# Patient Record
Sex: Female | Born: 1965
Health system: Southern US, Community
[De-identification: ages and names within clinical notes are randomized; demographics above are authoritative.]

## PROBLEM LIST (undated history)

## (undated) DIAGNOSIS — M1990 Unspecified osteoarthritis, unspecified site: Secondary | ICD-10-CM

## (undated) DIAGNOSIS — E119 Type 2 diabetes mellitus without complications: Secondary | ICD-10-CM

## (undated) DIAGNOSIS — A599 Trichomoniasis, unspecified: Secondary | ICD-10-CM

## (undated) DIAGNOSIS — G5700 Lesion of sciatic nerve, unspecified lower limb: Secondary | ICD-10-CM

## (undated) DIAGNOSIS — M2550 Pain in unspecified joint: Secondary | ICD-10-CM

## (undated) DIAGNOSIS — M549 Dorsalgia, unspecified: Secondary | ICD-10-CM

## (undated) DIAGNOSIS — M329 Systemic lupus erythematosus, unspecified: Secondary | ICD-10-CM

## (undated) DIAGNOSIS — G4700 Insomnia, unspecified: Secondary | ICD-10-CM

## (undated) DIAGNOSIS — IMO0002 Reserved for concepts with insufficient information to code with codable children: Secondary | ICD-10-CM

## (undated) DIAGNOSIS — I10 Essential (primary) hypertension: Secondary | ICD-10-CM

## (undated) DIAGNOSIS — G8929 Other chronic pain: Secondary | ICD-10-CM

## (undated) DIAGNOSIS — F1910 Other psychoactive substance abuse, uncomplicated: Secondary | ICD-10-CM

## (undated) DIAGNOSIS — R000 Tachycardia, unspecified: Secondary | ICD-10-CM

## (undated) HISTORY — DX: Essential (primary) hypertension: I10

## (undated) HISTORY — PX: APPENDECTOMY: SHX54

## (undated) HISTORY — PX: TONSILLECTOMY: SUR1361

---
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
## 1999-09-29 ENCOUNTER — Other Ambulatory Visit: Admission: RE | Admit: 1999-09-29 | Discharge: 1999-09-29 | Payer: Self-pay | Admitting: Family Medicine

## 2005-12-09 ENCOUNTER — Ambulatory Visit: Payer: Self-pay | Admitting: Internal Medicine

## 2005-12-16 ENCOUNTER — Ambulatory Visit (HOSPITAL_COMMUNITY): Admission: RE | Admit: 2005-12-16 | Discharge: 2005-12-16 | Payer: Self-pay | Admitting: Hospitalist

## 2006-04-20 ENCOUNTER — Ambulatory Visit: Payer: Self-pay | Admitting: Obstetrics and Gynecology

## 2006-05-04 ENCOUNTER — Ambulatory Visit: Payer: Self-pay | Admitting: Gynecology

## 2007-09-27 ENCOUNTER — Other Ambulatory Visit: Admission: RE | Admit: 2007-09-27 | Discharge: 2007-09-27 | Payer: Self-pay | Admitting: Obstetrics and Gynecology

## 2007-09-27 ENCOUNTER — Encounter: Payer: Self-pay | Admitting: Family Medicine

## 2007-09-27 ENCOUNTER — Ambulatory Visit: Payer: Self-pay | Admitting: Family Medicine

## 2007-10-09 ENCOUNTER — Ambulatory Visit (HOSPITAL_COMMUNITY): Admission: RE | Admit: 2007-10-09 | Discharge: 2007-10-09 | Payer: Self-pay | Admitting: Family Medicine

## 2007-10-25 ENCOUNTER — Ambulatory Visit: Payer: Self-pay | Admitting: Family Medicine

## 2008-04-03 ENCOUNTER — Ambulatory Visit: Payer: Self-pay | Admitting: Obstetrics & Gynecology

## 2008-05-22 ENCOUNTER — Emergency Department (HOSPITAL_COMMUNITY): Admission: EM | Admit: 2008-05-22 | Discharge: 2008-05-22 | Payer: Self-pay | Admitting: Emergency Medicine

## 2008-06-09 ENCOUNTER — Telehealth (INDEPENDENT_AMBULATORY_CARE_PROVIDER_SITE_OTHER): Payer: Self-pay | Admitting: *Deleted

## 2008-06-12 ENCOUNTER — Ambulatory Visit: Payer: Self-pay | Admitting: Family Medicine

## 2008-06-15 ENCOUNTER — Telehealth (INDEPENDENT_AMBULATORY_CARE_PROVIDER_SITE_OTHER): Payer: Self-pay | Admitting: Family Medicine

## 2008-06-20 ENCOUNTER — Inpatient Hospital Stay (HOSPITAL_COMMUNITY): Admission: EM | Admit: 2008-06-20 | Discharge: 2008-06-30 | Payer: Self-pay | Admitting: Emergency Medicine

## 2008-06-20 ENCOUNTER — Telehealth (INDEPENDENT_AMBULATORY_CARE_PROVIDER_SITE_OTHER): Payer: Self-pay | Admitting: Family Medicine

## 2008-06-20 ENCOUNTER — Ambulatory Visit: Payer: Self-pay | Admitting: Infectious Diseases

## 2008-06-27 ENCOUNTER — Encounter (INDEPENDENT_AMBULATORY_CARE_PROVIDER_SITE_OTHER): Payer: Self-pay | Admitting: Internal Medicine

## 2008-07-04 ENCOUNTER — Encounter: Payer: Self-pay | Admitting: *Deleted

## 2008-07-04 ENCOUNTER — Ambulatory Visit: Payer: Self-pay | Admitting: Family Medicine

## 2008-07-04 DIAGNOSIS — R739 Hyperglycemia, unspecified: Secondary | ICD-10-CM

## 2008-07-08 ENCOUNTER — Encounter: Payer: Self-pay | Admitting: *Deleted

## 2008-07-10 ENCOUNTER — Encounter (INDEPENDENT_AMBULATORY_CARE_PROVIDER_SITE_OTHER): Payer: Self-pay | Admitting: Family Medicine

## 2008-07-10 ENCOUNTER — Telehealth (INDEPENDENT_AMBULATORY_CARE_PROVIDER_SITE_OTHER): Payer: Self-pay | Admitting: Family Medicine

## 2008-07-10 ENCOUNTER — Ambulatory Visit: Payer: Self-pay | Admitting: Family Medicine

## 2008-07-11 ENCOUNTER — Encounter (INDEPENDENT_AMBULATORY_CARE_PROVIDER_SITE_OTHER): Payer: Self-pay | Admitting: Emergency Medicine

## 2008-07-11 ENCOUNTER — Encounter (INDEPENDENT_AMBULATORY_CARE_PROVIDER_SITE_OTHER): Payer: Self-pay | Admitting: Family Medicine

## 2008-07-11 ENCOUNTER — Ambulatory Visit: Payer: Self-pay | Admitting: Vascular Surgery

## 2008-07-11 ENCOUNTER — Ambulatory Visit (HOSPITAL_COMMUNITY): Admission: RE | Admit: 2008-07-11 | Discharge: 2008-07-11 | Payer: Self-pay | Admitting: Family Medicine

## 2008-07-23 ENCOUNTER — Ambulatory Visit: Payer: Self-pay | Admitting: Family Medicine

## 2008-07-29 ENCOUNTER — Encounter (INDEPENDENT_AMBULATORY_CARE_PROVIDER_SITE_OTHER): Payer: Self-pay | Admitting: Family Medicine

## 2008-07-30 ENCOUNTER — Encounter (INDEPENDENT_AMBULATORY_CARE_PROVIDER_SITE_OTHER): Payer: Self-pay | Admitting: Family Medicine

## 2008-08-04 ENCOUNTER — Ambulatory Visit: Payer: Self-pay | Admitting: Family Medicine

## 2008-08-04 DIAGNOSIS — F17200 Nicotine dependence, unspecified, uncomplicated: Secondary | ICD-10-CM | POA: Insufficient documentation

## 2008-08-06 ENCOUNTER — Encounter (HOSPITAL_BASED_OUTPATIENT_CLINIC_OR_DEPARTMENT_OTHER): Admission: RE | Admit: 2008-08-06 | Discharge: 2008-11-04 | Payer: Self-pay | Admitting: Internal Medicine

## 2008-08-26 ENCOUNTER — Telehealth: Payer: Self-pay | Admitting: *Deleted

## 2008-08-26 ENCOUNTER — Encounter (INDEPENDENT_AMBULATORY_CARE_PROVIDER_SITE_OTHER): Payer: Self-pay | Admitting: Family Medicine

## 2008-08-27 ENCOUNTER — Encounter (INDEPENDENT_AMBULATORY_CARE_PROVIDER_SITE_OTHER): Payer: Self-pay | Admitting: Family Medicine

## 2008-09-08 ENCOUNTER — Encounter (INDEPENDENT_AMBULATORY_CARE_PROVIDER_SITE_OTHER): Payer: Self-pay | Admitting: Family Medicine

## 2008-10-15 ENCOUNTER — Ambulatory Visit: Payer: Self-pay | Admitting: Family Medicine

## 2008-10-15 ENCOUNTER — Encounter: Payer: Self-pay | Admitting: Family Medicine

## 2008-10-15 DIAGNOSIS — E669 Obesity, unspecified: Secondary | ICD-10-CM

## 2008-10-20 ENCOUNTER — Encounter: Payer: Self-pay | Admitting: Family Medicine

## 2008-10-20 ENCOUNTER — Ambulatory Visit: Payer: Self-pay | Admitting: Family Medicine

## 2008-11-04 LAB — CONVERTED CEMR LAB
Albumin: 3.9 g/dL (ref 3.5–5.2)
BUN: 8 mg/dL (ref 6–23)
Calcium: 8.8 mg/dL (ref 8.4–10.5)
Chloride: 110 meq/L (ref 96–112)
Glucose, Bld: 112 mg/dL — ABNORMAL HIGH (ref 70–99)
HDL: 64 mg/dL (ref >39)
Potassium: 3.8 meq/L (ref 3.5–5.3)
Triglycerides: 100 mg/dL (ref <150)

## 2008-12-03 ENCOUNTER — Encounter: Payer: Self-pay | Admitting: Family Medicine

## 2008-12-26 ENCOUNTER — Ambulatory Visit: Payer: Self-pay | Admitting: Family Medicine

## 2008-12-26 ENCOUNTER — Telehealth: Payer: Self-pay | Admitting: *Deleted

## 2008-12-30 ENCOUNTER — Encounter: Payer: Self-pay | Admitting: Family Medicine

## 2009-01-21 ENCOUNTER — Ambulatory Visit: Payer: Self-pay | Admitting: Family Medicine

## 2009-01-21 ENCOUNTER — Encounter: Payer: Self-pay | Admitting: Family Medicine

## 2009-03-12 ENCOUNTER — Ambulatory Visit: Payer: Self-pay | Admitting: Family Medicine

## 2009-04-07 ENCOUNTER — Encounter: Payer: Self-pay | Admitting: Family Medicine

## 2009-05-12 ENCOUNTER — Telehealth: Payer: Self-pay | Admitting: *Deleted

## 2009-05-15 ENCOUNTER — Encounter: Payer: Self-pay | Admitting: Family Medicine

## 2009-05-15 ENCOUNTER — Ambulatory Visit: Payer: Self-pay | Admitting: Family Medicine

## 2009-05-15 LAB — CONVERTED CEMR LAB
AST: 13 units/L (ref 0–37)
Alkaline Phosphatase: 54 units/L (ref 39–117)
BUN: 9 mg/dL (ref 6–23)
Basophils Absolute: 0 10*3/uL (ref 0.0–0.1)
Calcium: 9.2 mg/dL (ref 8.4–10.5)
Chloride: 104 meq/L (ref 96–112)
Creatinine, Ser: 0.68 mg/dL (ref 0.40–1.20)
Eosinophils Absolute: 0 10*3/uL (ref 0.0–0.7)
Eosinophils Relative: 0 % (ref 0–5)
HCT: 40 % (ref 36.0–46.0)
MCV: 78 fL (ref 78.0–100.0)
Platelets: 423 10*3/uL — ABNORMAL HIGH (ref 150–400)
RDW: 14.2 % (ref 11.5–15.5)

## 2009-06-15 ENCOUNTER — Telehealth: Payer: Self-pay | Admitting: Family Medicine

## 2009-06-22 ENCOUNTER — Encounter: Payer: Self-pay | Admitting: Family Medicine

## 2009-06-30 ENCOUNTER — Ambulatory Visit: Payer: Self-pay | Admitting: Family Medicine

## 2009-07-20 ENCOUNTER — Encounter: Payer: Self-pay | Admitting: Family Medicine

## 2009-07-23 ENCOUNTER — Telehealth: Payer: Self-pay | Admitting: Family Medicine

## 2009-07-27 ENCOUNTER — Encounter: Payer: Self-pay | Admitting: Family Medicine

## 2009-08-24 ENCOUNTER — Telehealth: Payer: Self-pay | Admitting: Family Medicine

## 2009-08-31 ENCOUNTER — Encounter: Payer: Self-pay | Admitting: Family Medicine

## 2009-09-24 ENCOUNTER — Encounter: Payer: Self-pay | Admitting: Family Medicine

## 2009-09-30 ENCOUNTER — Telehealth: Payer: Self-pay | Admitting: Family Medicine

## 2009-10-01 DIAGNOSIS — M329 Systemic lupus erythematosus, unspecified: Secondary | ICD-10-CM

## 2009-10-06 ENCOUNTER — Telehealth (INDEPENDENT_AMBULATORY_CARE_PROVIDER_SITE_OTHER): Payer: Self-pay | Admitting: *Deleted

## 2009-10-12 ENCOUNTER — Encounter: Payer: Self-pay | Admitting: Family Medicine

## 2009-10-16 ENCOUNTER — Ambulatory Visit: Payer: Self-pay | Admitting: Family Medicine

## 2009-11-05 ENCOUNTER — Ambulatory Visit: Payer: Self-pay | Admitting: Family Medicine

## 2009-11-23 ENCOUNTER — Telehealth: Payer: Self-pay | Admitting: *Deleted

## 2009-11-26 ENCOUNTER — Ambulatory Visit: Payer: Self-pay | Admitting: Family Medicine

## 2009-11-26 ENCOUNTER — Encounter: Payer: Self-pay | Admitting: Family Medicine

## 2009-11-26 ENCOUNTER — Inpatient Hospital Stay (HOSPITAL_COMMUNITY): Admission: EM | Admit: 2009-11-26 | Discharge: 2009-11-30 | Payer: Self-pay | Admitting: Emergency Medicine

## 2009-11-26 LAB — CONVERTED CEMR LAB
BUN: 4 mg/dL — ABNORMAL LOW (ref 6–23)
Basophils Relative: 0 % (ref 0–1)
CO2: 25 meq/L (ref 19–32)
Chloride: 105 meq/L (ref 96–112)
Creatinine, Ser: 0.6 mg/dL (ref 0.40–1.20)
Glucose, Bld: 142 mg/dL — ABNORMAL HIGH (ref 70–99)
Hemoglobin: 8.1 g/dL — ABNORMAL LOW (ref 12.0–15.0)
Lymphocytes Relative: 15 % (ref 12–46)
Lymphs Abs: 1.8 10*3/uL (ref 0.7–4.0)
MCHC: 27.4 g/dL — ABNORMAL LOW (ref 30.0–36.0)
Monocytes Absolute: 1 10*3/uL (ref 0.1–1.0)
Monocytes Relative: 9 % (ref 3–12)
Neutro Abs: 8.6 10*3/uL — ABNORMAL HIGH (ref 1.7–7.7)
RBC: 4.46 M/uL (ref 3.87–5.11)
WBC: 11.5 10*3/uL — ABNORMAL HIGH (ref 4.0–10.5)

## 2009-11-27 ENCOUNTER — Encounter (INDEPENDENT_AMBULATORY_CARE_PROVIDER_SITE_OTHER): Payer: Self-pay | Admitting: Cardiology

## 2009-12-03 ENCOUNTER — Ambulatory Visit: Payer: Self-pay | Admitting: Family Medicine

## 2009-12-10 ENCOUNTER — Ambulatory Visit: Payer: Self-pay | Admitting: Family Medicine

## 2009-12-14 ENCOUNTER — Encounter: Payer: Self-pay | Admitting: Family Medicine

## 2009-12-17 ENCOUNTER — Telehealth: Payer: Self-pay | Admitting: Family Medicine

## 2009-12-18 ENCOUNTER — Encounter: Payer: Self-pay | Admitting: Family Medicine

## 2009-12-18 ENCOUNTER — Ambulatory Visit: Payer: Self-pay | Admitting: Family Medicine

## 2009-12-18 DIAGNOSIS — G609 Hereditary and idiopathic neuropathy, unspecified: Secondary | ICD-10-CM | POA: Insufficient documentation

## 2009-12-18 LAB — CONVERTED CEMR LAB
BUN: 9 mg/dL (ref 6–23)
CO2: 23 meq/L (ref 19–32)
Calcium: 9 mg/dL (ref 8.4–10.5)
Creatinine, Ser: 0.74 mg/dL (ref 0.40–1.20)
Glucose, Bld: 128 mg/dL — ABNORMAL HIGH (ref 70–99)
Magnesium: 1.7 mg/dL (ref 1.5–2.5)

## 2009-12-29 ENCOUNTER — Telehealth: Payer: Self-pay | Admitting: Family Medicine

## 2010-01-11 ENCOUNTER — Telehealth: Payer: Self-pay | Admitting: Family Medicine

## 2010-01-19 ENCOUNTER — Encounter: Payer: Self-pay | Admitting: Family Medicine

## 2010-01-25 ENCOUNTER — Ambulatory Visit: Payer: Self-pay | Admitting: Family Medicine

## 2010-01-25 DIAGNOSIS — M129 Arthropathy, unspecified: Secondary | ICD-10-CM | POA: Insufficient documentation

## 2010-02-03 ENCOUNTER — Telehealth: Payer: Self-pay | Admitting: Family Medicine

## 2010-02-19 ENCOUNTER — Encounter: Payer: Self-pay | Admitting: Family Medicine

## 2010-02-19 ENCOUNTER — Ambulatory Visit: Payer: Self-pay | Admitting: Family Medicine

## 2010-03-09 ENCOUNTER — Encounter: Payer: Self-pay | Admitting: Family Medicine

## 2010-03-26 ENCOUNTER — Encounter (INDEPENDENT_AMBULATORY_CARE_PROVIDER_SITE_OTHER): Payer: Self-pay | Admitting: *Deleted

## 2010-04-24 ENCOUNTER — Encounter: Payer: Self-pay | Admitting: Family Medicine

## 2010-04-27 ENCOUNTER — Ambulatory Visit: Admission: RE | Admit: 2010-04-27 | Discharge: 2010-04-27 | Payer: Self-pay | Source: Home / Self Care

## 2010-05-09 ENCOUNTER — Emergency Department (HOSPITAL_COMMUNITY)
Admission: EM | Admit: 2010-05-09 | Discharge: 2010-05-09 | Payer: Self-pay | Source: Home / Self Care | Admitting: Emergency Medicine

## 2010-05-10 LAB — BASIC METABOLIC PANEL
BUN: 12 mg/dL (ref 6–23)
CO2: 26 mEq/L (ref 19–32)
Calcium: 8.6 mg/dL (ref 8.4–10.5)
Chloride: 105 mEq/L (ref 96–112)
Creatinine, Ser: 0.81 mg/dL (ref 0.4–1.2)
GFR calc Af Amer: >60 mL/min (ref >60)
GFR calc non Af Amer: >60 mL/min (ref >60)
Glucose, Bld: 123 mg/dL — ABNORMAL HIGH (ref 70–99)
Potassium: 3.7 mEq/L (ref 3.5–5.1)
Sodium: 137 mEq/L (ref 135–145)

## 2010-05-10 LAB — URINALYSIS, ROUTINE W REFLEX MICROSCOPIC
Bilirubin Urine: NEGATIVE
Hgb urine dipstick: NEGATIVE
Ketones, ur: 15 mg/dL — AB
Leukocytes, UA: NEGATIVE
Nitrite: NEGATIVE
Protein, ur: 30 mg/dL — AB
Specific Gravity, Urine: 1.034 — ABNORMAL HIGH (ref 1.005–1.030)
Urine Glucose, Fasting: NEGATIVE mg/dL
Urobilinogen, UA: 0.2 mg/dL (ref 0.0–1.0)
pH: 5.5 (ref 5.0–8.0)

## 2010-05-10 LAB — CBC
HCT: 33.6 % — ABNORMAL LOW (ref 36.0–46.0)
Hemoglobin: 9.4 g/dL — ABNORMAL LOW (ref 12.0–15.0)
MCH: 18.7 pg — ABNORMAL LOW (ref 26.0–34.0)
MCHC: 28 g/dL — ABNORMAL LOW (ref 30.0–36.0)
MCV: 66.8 fL — ABNORMAL LOW (ref 78.0–100.0)
Platelets: 378 10*3/uL (ref 150–400)
RBC: 5.03 MIL/uL (ref 3.87–5.11)
RDW: 17.6 % — ABNORMAL HIGH (ref 11.5–15.5)
WBC: 10.8 10*3/uL — ABNORMAL HIGH (ref 4.0–10.5)

## 2010-05-10 LAB — DIFFERENTIAL
Basophils Absolute: 0 10*3/uL (ref 0.0–0.1)
Basophils Relative: 0 % (ref 0–1)
Eosinophils Absolute: 0.2 10*3/uL (ref 0.0–0.7)
Eosinophils Relative: 2 % (ref 0–5)
Lymphocytes Relative: 17 % (ref 12–46)
Lymphs Abs: 1.9 10*3/uL (ref 0.7–4.0)
Monocytes Absolute: 0.7 10*3/uL (ref 0.1–1.0)
Monocytes Relative: 6 % (ref 3–12)
Neutro Abs: 8.1 10*3/uL — ABNORMAL HIGH (ref 1.7–7.7)
Neutrophils Relative %: 75 % (ref 43–77)

## 2010-05-10 LAB — URINE MICROSCOPIC-ADD ON

## 2010-05-14 ENCOUNTER — Ambulatory Visit: Admission: RE | Admit: 2010-05-14 | Discharge: 2010-05-14 | Payer: Self-pay | Source: Home / Self Care

## 2010-05-17 ENCOUNTER — Ambulatory Visit
Admission: RE | Admit: 2010-05-17 | Discharge: 2010-05-17 | Payer: Self-pay | Source: Home / Self Care | Attending: Family Medicine | Admitting: Family Medicine

## 2010-05-17 ENCOUNTER — Ambulatory Visit: Admission: RE | Admit: 2010-05-17 | Discharge: 2010-05-17 | Payer: Self-pay | Source: Home / Self Care

## 2010-05-17 DIAGNOSIS — M79609 Pain in unspecified limb: Secondary | ICD-10-CM | POA: Insufficient documentation

## 2010-05-19 ENCOUNTER — Telehealth: Payer: Self-pay | Admitting: Family Medicine

## 2010-05-22 ENCOUNTER — Emergency Department (HOSPITAL_COMMUNITY)
Admission: EM | Admit: 2010-05-22 | Discharge: 2010-05-22 | Payer: Self-pay | Source: Home / Self Care | Admitting: Emergency Medicine

## 2010-05-22 ENCOUNTER — Encounter: Payer: Self-pay | Admitting: Sports Medicine

## 2010-05-22 LAB — POCT I-STAT, CHEM 8
BUN: 5 mg/dL — ABNORMAL LOW (ref 6–23)
Creatinine, Ser: 0.8 mg/dL (ref 0.4–1.2)
Glucose, Bld: 117 mg/dL — ABNORMAL HIGH (ref 70–99)
Hemoglobin: 11.9 g/dL — ABNORMAL LOW (ref 12.0–15.0)
TCO2: 25 mmol/L (ref 0–100)

## 2010-05-22 LAB — DIFFERENTIAL
Basophils Absolute: 0 10*3/uL (ref 0.0–0.1)
Basophils Relative: 0 % (ref 0–1)
Eosinophils Relative: 2 % (ref 0–5)
Monocytes Absolute: 0.7 10*3/uL (ref 0.1–1.0)
Monocytes Relative: 7 % (ref 3–12)

## 2010-05-22 LAB — CBC
HCT: 33.3 % — ABNORMAL LOW (ref 36.0–46.0)
MCH: 18.9 pg — ABNORMAL LOW (ref 26.0–34.0)
MCHC: 28.8 g/dL — ABNORMAL LOW (ref 30.0–36.0)
RDW: 17.3 % — ABNORMAL HIGH (ref 11.5–15.5)

## 2010-05-24 ENCOUNTER — Encounter: Payer: Self-pay | Admitting: Family Medicine

## 2010-05-27 NOTE — Assessment & Plan Note (Signed)
Summary: Admit H&P   Vital Signs:  Patient profile:   45 year old female O2 Sat:      100 % on 2 L/min Pulse rate:   108 / minute Resp:     21 per minute BP supine:   135 / 78  O2 Flow:  2 L/min  Primary Care Provider:  Priscella Mann MD   History of Present Illness: This is a 45 yo woman with PMH of SLE, thrombotic vasculitis who presents with dyspnea and chest pain.  The dyspnea began a week ago but improved after 3 days.  Two days ago, she developed worsening SOB and right-sided sternal chest pain. [facility], [facility] and heaviness in her chest.  D-dimer was positive at 3.  Was sent to ED for CT angio and hospital admission for further evaluation.  Pt was dx with SLE 2 months ago and new medications include Prednisone and Fondaparinux.    ER course: EKG showed diffuse ST elevations.  CT angio showed small to moderate-sized bilateral pleural effusions,  large pericardial effusion measuring 3.1cm in maximum thickness.  Placed on 2L O2.  Problems Prior to Update: 1)  Unspecified Tachycardia  (ICD-785.0) 2)  Dyspnea  (ICD-786.05) 3)  Lupus Erythematosus  (ICD-695.4) 4)  Sore Throat  (ICD-462) 5)  Adjustment Disorder With Depressed Mood  (ICD-309.0) 6)  Obesity, Unspecified  (ICD-278.00) 7)  Screening For Lipoid Disorders  (ICD-V77.91) 8)  Vasculitis  (ICD-447.6) 9)  Tobacco Abuse  (ICD-305.1) 10)  Hyperglycemia  (ICD-790.29) 11)  Preventive Health Care  (ICD-V70.0)  Current Medications (verified): 1)  Prednisone 10 Mg Tabs (Prednisone) .Marland Kitchen.. 10 Mg By Mouth Daily 2)  Caltrate 600+d 600-400 Mg-Unit  Tabs (Calcium Carbonate-Vitamin D) .... Take 1 Tablet By Mouth Two Times A Day Per Dr Kellie Simmering While On Prednsione 3)  Percocet 5-325 Mg Tabs (Oxycodone-Acetaminophen) .Marland Kitchen.. 1-2 Tabs By Mouth Every 6 Hours As Needed For Pain 4)  Arixtra .... Injected Daily 5)  Hydroxychloroquine (Plaquenil)  Allergies (verified): 1)  ! 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 Seen initially by Dr Kellie Simmering and Dr Nicholas Lose for this. On steroids and trental. Truslow has declined to see any further, now seeing Dr. Lucianne Muss at [facility] (rheum). Recently seen at [facility] with (+) anticardiolipin antibody consistent with antiphospholipid syndrome. Per patient, Dr. Lucianne Muss has diagnosed her with lupus. -smoking - not interested in quitting  Past Surgical History: Last updated: 07/04/2008 none  Family History: Last updated: 07/04/2008 mother - FSGS, autoimmune issues (no SLE, no RA) Breast cancer - aunt  Social History: Last updated: 06/30/2009 Pt lives with mother - Jiles Prows who is also a pt at [facility]. 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 mother - FSGS, autoimmune issues (no SLE, no RA) Breast cancer - aunt  Social History: Reviewed history from 06/30/2009 and no changes required. Pt lives with mother - Jiles Prows who is also a pt at [facility]. 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 Perrla.  Mouth:  Oral mucosa and oropharynx without lesions or exudates.  Teeth in good repair. Neck:  No deformities, masses, or tenderness noted. No JVD. Lungs:  Decreased breath sounds.  Crackles at bilateral bases.  No wheezes. Heart:  Heart sounds dimished.  Normal rate and regular rhythm. S1 and S2 normal without gallop, murmur, click, rub or other extra sounds. Abdomen:  Bowel sounds positive,abdomen soft and non-tender  Skin:  no edema, hyperpigmented lesions on bilateral lower extremities Additional Exam:  Myoglobin 33.4, CKMB <0.1, Troponin <0.05 PT 15.5, INR 1.21, aPTT 31. CXR shows vague opacities at the lung bases left greater than right.  Mod cardiomegaly. CT angio shows small to mod size bil pleural effusions.  Large  pericardial effusion, measuring 3.1 in max thickness.   Impression & Recommendations:  Problem # 1:  Dyspnea secondary to pericardial effusions Effusions likely due to SLE vs. lung ca vs pericarditis.  Pt remains SOB and still c/o heavy sensation in her chest.  Will consult cards for further recommendations.  Will order 2D echo, cardiac enzymes x3.  WIll treat with Indomethacin and monitor in step down.  Problem # 2:  SLE Pt seen by rheumatologist in [facility].   Dx with SLE and thrombotic vasculitis.  WIll order complement C3/C4, ESR/CRP.  Will continue home medications, Fondaparinux.  May f/u if [facility] if necessary.  Problem # 3:  FEN/GI NPO except medications until am.  WIll re-evaluate tomorrow.  Will f/u with cards regarding procedure and recommendations.  Will give NS 100cc/hr.  Problem # 4:  PPx Will continue Fondaparinux.  Will give Protonix 40mg  by mouth daily.  Problem # 5:  chest pain/heaviness Likely due to pericarditis.  Cycling cardiac enzymes.  WIll f/u tele and ECG in am.  Problem # 6:  Disposition 45 yo female w/ PMH SLE and thrombolic vasculitis p/w dyspnea and heavy sensation in chest.  CT angio shows pericardial effusions likely due to SLE vs. pericarditis vs. lung ca.  Will monitor in step down.  Will f/u am labs and 2D echo.  Will f/u with cardiology for pericardial effusions and further recommendations.  Complete Medication List: 1)  Prednisone 10 Mg Tabs (Prednisone) .Marland Kitchen.. 10 mg by mouth daily 2)  Caltrate 600+d 600-400 Mg-unit Tabs (Calcium carbonate-vitamin d) .... Take 1 tablet by mouth two times a day per dr truslow while on prednsione 3)  Percocet 5-325 Mg Tabs (Oxycodone-acetaminophen) .Marland Kitchen.. 1-2 tabs by mouth every 6 hours as needed for pain 4)  Arixtra  .... Injected daily 5)  Hydroxychloroquine (plaquenil)

## 2010-05-27 NOTE — Progress Notes (Signed)
Summary: Rx Req  Phone Note Refill Request Call back at [phone] Message from:  Patient  Refills Requested: Medication #1:  PERCOCET 5-325 MG TABS 1-2 tabs by mouth every 6 hours as needed for pain Initial call taken by: Clydell Hakim,  November 23, 2009 3:28 PM  Follow-up for Phone Call        to preceptor Follow-up by: Golden Circle RN,  November 23, 2009 3:51 PM  Additional Follow-up for Phone Call Additional follow up Details #1::        LM that it is here for pick up Additional Follow-up by: Golden Circle RN,  November 23, 2009 3:59 PM    Prescriptions: PERCOCET 5-325 MG TABS (OXYCODONE-ACETAMINOPHEN) 1-2 tabs by mouth every 6 hours as needed for pain  #30 x 0   Entered and Authorized by:   Paula Compton MD   Signed by:   Paula Compton MD on 11/23/2009   Method used:   Print then Give to Patient   RxID:   [id number]  According to Dr. Celene Skeen office visit note of July 14, plan is to prescribe an additional #30 percocet tablets now, and keep followup with Dr. Madolyn Frieze for further refills.  Prescription for #30 tabs written and given to front desk.  I submit this prescription on behalf of the patient's regular physician. Paula Compton MD  November 23, 2009 3:55 PM

## 2010-05-27 NOTE — Consult Note (Signed)
Summary: [facility]  [facility]   Imported By: Clydell Hakim 01/20/2010 16:05:06  _____________________________________________________________________  External Attachment:    Type:   Image     Comment:   External Document

## 2010-05-27 NOTE — Assessment & Plan Note (Signed)
Summary: Lupus arthritis, tobacco cessation   Vital Signs:  Patient profile:   45 year old female Height:      63 inches Weight:      238 pounds BMI:     42.31 Temp:     98.9 degrees F oral Pulse rate:   102 / minute BP sitting:   152 / 83  (left arm) Cuff size:   large  Vitals Entered By: Garen Grams LPN (April 27, 2010 2:53 PM) CC: f/u Is Patient Diabetic? No Pain Assessment Patient in pain? yes     Location: legs Intensity: 9   Primary Provider:  Priscella Mann MD  CC:  f/u.  History of Present Illness: 1. Joint pain Has gotten worse since she last saw me about 1 month ago. Pain in various places but especially bad in her left hip and knee. Particularly bad since after New Years. Feels like her lupus pain but worse than baseline. Achey, burning, excruciating ache. Intermittent but throughout day. Not worse at a particular time of day. Over past few days, increased Tramadol from 2 to 3 tablets and oxycodone from 2 to 6 tablets. Her pain clinic appointment is coming up in April. Patient has not seen rheumatologist since her last visit with me. Her next appointment is on 01/18.  2. Tobacco Following by her resolution to stop smoking since first of this month. Despite pain. Using patches and lozenges to help with cravings.    Preventive Screening-Counseling & Management  Alcohol-Tobacco     Smoking Status: quit     Smoking Cessation Counseling: yes     Smoke Cessation Stage: quit     Packs/Day: 1.0     Tobacco Counseling: to quit use of tobacco products  Current Medications (verified): 1)  Prednisone 10 Mg Tabs (Prednisone) .... 4 Tabs By Mouth Daily For 5 Days Then Take 1 Tab By Mouth Daily 2)  Caltrate 600+d 600-400 Mg-Unit  Tabs (Calcium Carbonate-Vitamin D) .... Take 1 Tablet By Mouth Two Times A Day Per Dr Kellie Simmering While On Prednsione 3)  Hydroxychloroquine (Plaquenil) .... Tale 1 Tablet Two Times A Day. 4)  Metformin Hcl 500 Mg Tabs (Metformin Hcl) .... Take One  Tablet Daily. 5)  Oxycodone Hcl 5 Mg Tabs (Oxycodone Hcl) .... Take 1 Tablet Every 12 Hrs As Needed For Breakthrough Pain. 6)  Ultram 50 Mg Tabs (Tramadol Hcl) .... Take 1 Tablet Twice A Day For Pain.  Allergies: 1)  ! Penicillin 2)  ! Kingsley Callander  Past History:  Past Medical History: -obesity -SLE(?): thrombtic vasculitis dx Jan-March 2010 seen initially by Dr Kellie Simmering and Dr Nicholas Lose for this-->Dr. Kellie Simmering has declined to see any further, now seeing Dr. Lucianne Muss at [facility] (Rheum) Dx c lupus due to (+) anticardiolipin antibody consistent with antiphospholipid syndrome   -hospitalized for pericarditis thought to be 2/2 lupus 11/2009   -on pain contract due to chronic opioid use for lupus-related arthralgias   -04/2010 high dose ibuprofen for increased pain   -new rheumatologist for next year (2012) is Dr. Katrinka Blazing (Dr. Lucianne Muss left)  Social History: Pt lives with boyfriend Maurine Minister). Accompanied by him today. Moved to new house.  Unemployed.   Physical Exam  General:  uncomfortable Lungs:  CTAB, no w/r/r Heart:  RRR, no m/g/r Msk:  gait: slow, limping sensation: intact all extremities L hip pain with flexion and extension L knee: moderate pain with maximum extension R knee: no pain with movement all joints no erythema or swelling  Pulses:  2+ radial & DP pulses Extremities:  no edema Skin:  no new lesions; several scars throughout body Psych:  some anxiety due to discomfort, not depressed appearing, good eye contact, normally itneractive, memory intact, fully alert & oriented   Impression & Recommendations:  Problem # 1:  UNSPECIFIED ARTHROPATHY MULTIPLE SITES (ICD-716.99) Assessment Deteriorated Likely 2/2 lupus. Will start high dose ibuprofen today along with oxycodone and Tramadol. NSAID should do better job of controlling pain than opioids. Do not think patient purposely violated pain contract. Forthright about explaining about increasing dosages due to acute episode. Reminded patient of  contract, and patient expresses understanding.  May consider steroid burst if pain does not improve. Will come in if pain doesn't get better next few days. Will just follow-up with rheumatologist (Dr. Katrinka Blazing) if it does.   Orders: [facility]- Est Level  3 (91478)  Problem # 2:  TOBACCO ABUSE (ICD-305.1) Encouraged quitting effort, will follow-up.   Problem # 3:  PREVENTIVE HEALTH CARE (ICD-V70.0) Needs flu vaccine but not today considering acute episode. Will schedule for Pap. Will schedule for mammogram at next visit.   Complete Medication List: 1)  Prednisone 10 Mg Tabs (Prednisone) .... 4 tabs by mouth daily for 5 days then take 1 tab by mouth daily 2)  Caltrate 600+d 600-400 Mg-unit Tabs (Calcium carbonate-vitamin d) .... Take 1 tablet by mouth two times a day per dr truslow while on prednsione 3)  Hydroxychloroquine (plaquenil)  .... Tale 1 tablet two times a day. 4)  Metformin Hcl 500 Mg Tabs (Metformin hcl) .... Take one tablet daily. 5)  Oxycodone Hcl 5 Mg Tabs (Oxycodone hcl) .... Take 1 tablet every 12 hrs as needed for breakthrough pain. 6)  Ultram 50 Mg Tabs (Tramadol hcl) .... Take 1 tablet twice a day for pain.  Other Orders: Mammogram (Screening) (Mammo)  Patient Instructions: 1)  For your joint pain, please try ibuprofen 800 mg four times a day (every 6 hours). Please back down to your normal dose of Tramadol & oxycodone.  2)  If your pain doesn't improve in the next few days, please make an appointment to come & see me again.  3)  Please schedule an appointment to see me in 1 month for a Pap smear & we can reassess your arthritis then.  4)  I will refer your for a mammogram & they will call for an appt.  Prescriptions: ULTRAM 50 MG TABS (TRAMADOL HCL) Take 1 tablet twice a day for pain.  #60 x 0   Entered and Authorized by:   Priscella Mann MD   Signed by:   Lucianne Muss Park MD on 04/27/2010   Method used:   Print then Give to Patient   RxID:   [id number] OXYCODONE HCL  5 MG TABS (OXYCODONE HCL) Take 1 tablet every 12 hrs as needed for breakthrough pain.  #28 x 0   Entered and Authorized by:   Priscella Mann MD   Signed by:   Lucianne Muss Park MD on 04/27/2010   Method used:   Print then Give to Patient   RxID:   [id number]      Orders Added: 1)  [facility]- Est Level  3 [72536] 2)  Mammogram (Screening) [Mammo]

## 2010-05-27 NOTE — Progress Notes (Signed)
Summary: triage  Phone Note Call from Patient Call back at [phone]   Caller: Patient Summary of Call: Pt has really bad pain in her fingertips. Initial call taken by: Clydell Hakim,  December 17, 2009 12:09 PM  Follow-up for Phone Call        started 5 days ago. burning sensation. takes percocet but it does not help. never had this before. work in tomorrow pm per her request. will see Dr. Lelon Perla in pm Follow-up by: Golden Circle RN,  December 17, 2009 12:10 PM

## 2010-05-27 NOTE — Assessment & Plan Note (Signed)
Summary: RESCH FOR LEG PAIN/LF W/O BEING SEEN/1/20/[facility]   Vital Signs:  Patient profile:   45 year old female Height:      63 inches Weight:      238 pounds BMI:     42.31 BSA:     2.08 Temp:     98.2 degrees F Pulse rate:   88 / minute BP sitting:   150 / 88  Vitals Entered By: Jone Baseman CMA (May 17, 2010 9:24 AM) CC: right leg pain x 1 week Is Patient Diabetic? No Pain Assessment Patient in pain? yes     Location: right leg  Intensity: 8   Primary Care Provider:  Priscella Mann MD  CC:  right leg pain x 1 week.  History of Present Illness: 1) Right leg pain: x 1 week. Seen in ER on 05/09/10, felt to be lupus flare - given IM Dilaudid and Solumedrol, told to follow up with Rheumatology. This treatment initially helped pain, however pain has returned. Pain is achy and is mainly in her right lateral lower leg and also calf, though radiates up to right lateral hip at times. Reports that this is worse than her typical lupus flare pain. Denies leg swelling, numbness, redness, trauma, immobilization, recent surgery.   ROS: denies fever, chills, nausea, emesis, diarrhea, vision change, skin change, chest pain, dyspnea, dysuria.   Habits & Providers  Alcohol-Tobacco-Diet     Tobacco Status: current     Tobacco Counseling: to quit use of tobacco products     Cigarette Packs/Day: 1.0  Current Medications (verified): 1)  Prednisone 10 Mg Tabs (Prednisone) .... 4 Tabs By Mouth Daily For 5 Days Then Take 1 Tab By Mouth Daily 2)  Caltrate 600+d 600-400 Mg-Unit  Tabs (Calcium Carbonate-Vitamin D) .... Take 1 Tablet By Mouth Two Times A Day Per Dr Kellie Simmering While On Prednsione 3)  Hydroxychloroquine (Plaquenil) .... Tale 1 Tablet Two Times A Day. 4)  Metformin Hcl 500 Mg Tabs (Metformin Hcl) .... Take One Tablet Daily. 5)  Oxycodone Hcl 5 Mg Tabs (Oxycodone Hcl) .... Take 1 Tablet Every 12 Hrs As Needed For Breakthrough Pain. 6)  Ultram 50 Mg Tabs (Tramadol Hcl) .... Take 1  Tablet Twice A Day For Pain.  Allergies: 1)  ! Penicillin 2)  ! Kingsley Callander  Social History: Smoking Status:  current  Physical Exam  General:  uncomfortable appearing, NAD, vitals reviewed  Eyes:  vision grossly intact, pupils equal, pupils round, and pupils reactive to light.   Lungs:  CTAB, no w/r/r, normal work of breathing  Heart:  RRR, no m/g/r Msk:  - no obvious swelling or erythema bilateral lower extremities  - right calf tender to palpaation, left calf non tender  - right calf 41.5 cm / left calf 41.5 cm  - negative SLR bilaterally  - negative FABER bilaterally  - equivocal Homan's sign on right, negative on left  - full ROM bilateral knees with minimal increase in pain at right lateral lower leg and calf with flexion  - no joint line tenderness or crepitation at right knee - no popliteal swelling or mass - no tenderness to palpation over greater trochanter on right - no SI joint tenderness or lumbar tenderness  Pulses:  2+ pedals  Extremities:  no edema Neurologic:  strength normal in all extremities and sensation intact to light touch.   Skin:  chronic vasculitic changes to bilateral lower extremities   Impression & Recommendations:  Problem # 1:  LEG PAIN,  RIGHT (ICD-729.5) Assessment New Concern for DVT given history of lupus, location and character of pain (though no palpable cord, equivocal Homan's sign, no calf size discrepancy). Sent for Venous Dopplers - Venous Dopplers NEGATIVE for deep or superficial thrombosis. Will treat likely lupus flare with NSAID will use ibuprofen (Aleve is listed in allergies but patient states that she is not allergic to this). Orders: LE Venous Duplex (DVT) (DVT) [facility]- Est Level  3 (75643)  CALLED PATIENT: Advised regarding findings on Dopplers. Advised to continue home medications, ibuprofen, follow up with rheumatology.   Complete Medication List: 1)  Prednisone 10 Mg Tabs (Prednisone) .... 4 tabs by mouth daily for 5 days then  take 1 tab by mouth daily 2)  Caltrate 600+d 600-400 Mg-unit Tabs (Calcium carbonate-vitamin d) .... Take 1 tablet by mouth two times a day per dr truslow while on prednsione 3)  Hydroxychloroquine (plaquenil)  .... Tale 1 tablet two times a day. 4)  Metformin Hcl 500 Mg Tabs (Metformin hcl) .... Take one tablet daily. 5)  Oxycodone Hcl 5 Mg Tabs (Oxycodone hcl) .... Take 1 tablet every 12 hrs as needed for breakthrough pain. 6)  Ultram 50 Mg Tabs (Tramadol hcl) .... Take 1 tablet twice a day for pain. 7)  Naproxen 500 Mg Tabs (Naproxen) .... One tab by mouth two times a day x 10 days  Patient Instructions: 1)  Go to get the ultrasound of of legs now. 2)  I will have them call me with the results 3)  I will let you know when to come back in by telephone 4)  If you have any chest pain or shortness of breath, give me a call  5)  Take your pain medication as prescribed - I will add on another pain medication once we see that you do not have a blood clot in your leg  Prescriptions: NAPROXEN 500 MG TABS (NAPROXEN) one tab by mouth two times a day x 10 days  #20 x 0   Entered and Authorized by:   Bobby Rumpf  MD   Signed by:   Bobby Rumpf  MD on 05/17/2010   Method used:   Electronically to        [facility] (retail)       [address]       [place], [place]  [address]       Ph: [phone]       Fax: [phone]   RxID:   [id number]    Orders Added: 1)  LE Venous Duplex (DVT) [DVT] 2)  [facility]- Est Level  3 [62831]

## 2010-05-27 NOTE — Progress Notes (Signed)
Summary: Rx Req  Phone Note Refill Request Call back at Home Phone [phone] Message from:  Patient  Refills Requested: Medication #1:  PERCOCET 5-325 MG TABS 1-2 tabs by mouth every 6 hours as needed for pain Initial call taken by: Clydell Hakim,  July 23, 2009 11:59 AM  Follow-up for Phone Call        tried to call pt to discuss; no answer on phone. Will try later.  Follow-up by: Myrtie Soman  MD,  July 24, 2009 11:04 AM  Additional Follow-up for Phone Call Additional follow up Details #1::        spoke with pt. has been back to see Dr. Lucianne Muss at [facility]. States that he recommends transitioning from prednisone to coumadin as he believes her disease to be more thrombotic vasculitis. Also recommended continuing the percocet for now for pain. Will refill monthly as we discussed. Pt is agreeable.  Additional Follow-up by: Myrtie Soman  MD,  July 24, 2009 1:45 PM    Prescriptions: PERCOCET 5-325 MG TABS (OXYCODONE-ACETAMINOPHEN) 1-2 tabs by mouth every 6 hours as needed for pain  #100 x 0   Entered and Authorized by:   Myrtie Soman  MD   Signed by:   Myrtie Soman  MD on 07/24/2009   Method used:   Print then Give to Patient   RxID:   [id number]

## 2010-05-27 NOTE — Progress Notes (Signed)
Summary: Rx Req  Phone Note Refill Request Call back at Home Phone [phone] Message from:  Patient  Refills Requested: Medication #1:  PERCOCET 5-325 MG TABS 1-2 tabs by mouth every 6 hours as needed for pain PLEASE CALL WHEN READY TO PICK UP.  Initial call taken by: Clydell Hakim,  June 15, 2009 3:06 PM  Follow-up for Phone Call        rx written. if she's needs more than this, please have her schedule an appointment. thanks. Follow-up by: Myrtie Soman  MD,  June 17, 2009 12:27 PM    Prescriptions: PERCOCET 5-325 MG TABS (OXYCODONE-ACETAMINOPHEN) take 1-2 tablets by mouth every 4-6 hours as needed for pain; do not fill until 05/18/09  #60 x 0   Entered and Authorized by:   Myrtie Soman  MD   Signed by:   Myrtie Soman  MD on 06/17/2009   Method used:   Print then Give to Patient   RxID:   [id number]   Appended Document: Rx Req Patient informed

## 2010-05-27 NOTE — Miscellaneous (Signed)
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 Kellie Simmering has declined to see any further, now seeing Dr. Lucianne Muss at [facility] (Rheum) Dx c lupus due to (+) anticardiolipin antibody consistent with antiphospholipid syndrome   -hospitalized for pericarditis thought to be 2/2 lupus 11/2009   -on pain contract due to chronic opioid use for lupus-related arthralgias

## 2010-05-27 NOTE — Assessment & Plan Note (Signed)
Summary: h/fup,tcb   Vital Signs:  Patient profile:   45 year old female Height:      63 inches Weight:      227.4 pounds BMI:     40.43 Temp:     98.3 degrees F oral Pulse rate:   105 / minute BP sitting:   119 / 82  (left arm) Cuff size:   large  Vitals Entered By: Garen Grams LPN (December 10, 2009 2:44 PM) CC: f/u pericarditis Is Patient Diabetic? No Pain Assessment Patient in pain? no        Primary Provider:  Priscella Mann MD  CC:  f/u pericarditis.  History of Present Illness: 1. SOB 2/2 pericardial effusion 2/2 pericarditis. Much improved, still occ with activity. But now full activity.  Now c/o constant lightheadedness, thinks 2/2 Lasix b/c has been going on since taking meds. Uncomfortable driving. Denies fainting, falls, vision changes.  f/u appt: Rheum 22nd, Cards 31st  ROS: occ heart palpitations Denies CP  2. Preventative Scheduled to start water aerobics middle of September!   Preventive Screening-Counseling & Management  Alcohol-Tobacco     Smoking Status: never  Current Medications (verified): 1)  Prednisone 5 Mg Tabs (Prednisone) .... Take 3 Tablets A Day. 2)  Caltrate 600+d 600-400 Mg-Unit  Tabs (Calcium Carbonate-Vitamin D) .... Take 1 Tablet By Mouth Two Times A Day Per Dr Kellie Simmering While On Prednsione 3)  Percocet 5-325 Mg Tabs (Oxycodone-Acetaminophen) .Marland Kitchen.. 1-2 Tabs By Mouth Every 6 Hours As Needed For Pain 4)  Arixtra .... Injected Daily 5)  Hydroxychloroquine (Plaquenil) .... Tale 1 Tablet Two Times A Day. 6)  Furosemide 40 Mg Tabs (Furosemide) .... Take 1 Two Times A Day. 7)  Flexeril 10 Mg Tabs (Cyclobenzaprine Hcl) .... 2 Tablets At Bedtime. 8)  Indomethacin 50 Mg Caps (Indomethacin) .... Take 1 Tablets Every 12 Hrs. 9)  Metformin Hcl 500 Mg Tabs (Metformin Hcl) .... Take One Tablet Daily.  Allergies (verified): 1)  ! Penicillin 2)  ! Kingsley Callander  Social History: Pt lives with boyfriend.  Smokes <1 ppd. Goal is to stop smoking the  first of the year. Denies alc, drugs.  Unemployed. Smoking Status:  never  Physical Exam  General:  Comfortable, no increased WOB Eyes:  EOMI PERRLA Could not visualized retina Neck:  Supple, no masses Lungs:  CTAB, no w/r/r Heart:  RRR, no S3/S4, no murmurs, gallops, or rubs; no lifts/heaves/thrills Abdomen:  soft, non-tender, normal bowel sounds, no distention, no masses, no guarding, no rigidity, and no rebound tenderness.   Neurologic:  cranial nerves II-XII intact and strength normal in all extremities.   Skin:  Slight diarphoresis, color normal Axillary Nodes:  no R axillary adenopathy and no L axillary adenopathy.     Impression & Recommendations:  Problem # 1:  OTHER ACUTE PERICARDITIS (ICD-420.99)  Pleural effusions and so pericarditis seem to have resolved based on symptoms.  Tapering off lasix and indomethacin.  Will follow-up Rheum and Cards appts this week. Pt will f/u with me few months to check-in.   Orders: [facility]- Est Level  3 (04540)  Problem # 2:  TOBACCO ABUSE (ICD-305.1) Tapering off on patient's own accord. Goal is to stop by end of this year.   Problem # 3:  OBESITY, UNSPECIFIED (ICD-278.00)  Will start exercising in September (once she expects fully recovered from this illness). Proud of her and will continue encourage activity.   Orders: [facility]- Est Level  3 (98119)  Problem # 4:  HYPERGLYCEMIA (  ICD-790.29) 2/2 steroid. Nothing done today since BS only slightly elevated 140's in hospital without metformin. Cont metformin. Waiting to see what Rheum will do about her steroids. Will consider D/Cing metformin if off steroids and sugars good.  Her updated medication list for this problem includes:    Metformin Hcl 500 Mg Tabs (Metformin hcl) .Marland Kitchen... Take one tablet daily.  Complete Medication List: 1)  Prednisone 5 Mg Tabs (Prednisone) .... Take 3 tablets a day. 2)  Caltrate 600+d 600-400 Mg-unit Tabs (Calcium carbonate-vitamin d) .... Take 1 tablet by mouth  two times a day per dr truslow while on prednsione 3)  Percocet 5-325 Mg Tabs (Oxycodone-acetaminophen) .Marland Kitchen.. 1-2 tabs by mouth every 6 hours as needed for pain 4)  Arixtra  .... Injected daily 5)  Hydroxychloroquine (plaquenil)  .... Tale 1 tablet two times a day. 6)  Furosemide 40 Mg Tabs (Furosemide) .... Take 1 two times a day. 7)  Flexeril 10 Mg Tabs (Cyclobenzaprine hcl) .... 2 tablets at bedtime. 8)  Indomethacin 50 Mg Caps (Indomethacin) .... Take 1 tablets every 12 hrs. 9)  Metformin Hcl 500 Mg Tabs (Metformin hcl) .... Take one tablet daily.  Patient Instructions: 1)  Please take Lasix 1 tablet for 2 days, then 1/2 a tablets x 2 days, then stop. If you feel short of breath, then please start taking at least 1 tablet a day and call the clinic. 2)  Please start decreasing your indomethacin. Take 1 once a day x2 days, then stop.  3)  Please follow-up in a few months so we can make sure everything is covered. 4)  I am so glad you are donig better!

## 2010-05-27 NOTE — Progress Notes (Signed)
Summary: Refill percocet  Phone Note Refill Request Call back at [phone] Message from:  Patient  Refills Requested: Medication #1:  PERCOCET 5-325 MG TABS 1-2 tabs by mouth every 6 hours as needed for pain Please call when ready  Initial call taken by: De Nurse,  December 29, 2009 9:59 AM    Prescriptions: PERCOCET 5-325 MG TABS (OXYCODONE-ACETAMINOPHEN) 1-2 tabs by mouth every 6 hours as needed for pain  #30 x 0   Entered and Authorized by:   Priscella Mann MD   Signed by:   Lucianne Muss Park MD on 12/30/2009   Method used:   Print then Give to Patient   RxID:   [id number] PERCOCET 5-325 MG TABS (OXYCODONE-ACETAMINOPHEN) 1-2 tabs by mouth every 6 hours as needed for pain  #30 x 0   Entered and Authorized by:   Priscella Mann MD   Signed by:   Lucianne Muss Park MD on 12/30/2009   Method used:   Print then Give to Patient   RxID:   [id number]  Only printed 1 copy. First copy didn't print.  Will give another 30. She will need a follow-up within the next week or 2 so I could re-evaluate her need for opiates.

## 2010-05-27 NOTE — Progress Notes (Signed)
Summary: refill  Phone Note Refill Request Call back at Home Phone [phone] Message from:  Patient  Refills Requested: Medication #1:  PERCOCET 5-325 MG TABS 1-2 tabs by mouth every 6 hours as needed for pain Please call when ready  Initial call taken by: De Nurse,  Aug 24, 2009 2:27 PM  Follow-up for Phone Call        rx ready, given to blue team staff.  Follow-up by: Myrtie Soman  MD,  Aug 25, 2009 2:01 PM    Prescriptions: PERCOCET 5-325 MG TABS (OXYCODONE-ACETAMINOPHEN) 1-2 tabs by mouth every 6 hours as needed for pain  #120 x 0   Entered and Authorized by:   Myrtie Soman  MD   Signed by:   Myrtie Soman  MD on 08/25/2009   Method used:   Handwritten   RxID:   [id number]   Appended Document: refill Pt informed that Rx is ready for pickup

## 2010-05-27 NOTE — Progress Notes (Signed)
Summary: phn msg  Phone Note Call from Patient Call back at [facility] Phone [phone] Call back at [phone]   Caller: Patient Summary of Call: Pt is returning call from nurse. Initial call taken by: Clydell Hakim,  October 06, 2009 4:56 PM  Follow-up for Phone Call        No answer Follow-up by: Gladstone Pih,  October 07, 2009 1:59 PM  Additional Follow-up for Phone Call Additional follow up Details #1::        No answer, will wait for pt to call with issue Additional Follow-up by: Gladstone Pih,  October 08, 2009 12:07 PM

## 2010-05-27 NOTE — Consult Note (Signed)
Summary: [facility] Hematology Oncology  [facility]   Imported By: Clydell Hakim 10/02/2009 15:34:58  _____________________________________________________________________  External Attachment:    Type:   Image     Comment:   External Document

## 2010-05-27 NOTE — Consult Note (Signed)
Summary: Rheumatology/Immuno  Rheumatology/Immuno   Imported By: Clydell Hakim 10/21/2009 15:35:39  _____________________________________________________________________  External Attachment:    Type:   Image     Comment:   External Document

## 2010-05-27 NOTE — Assessment & Plan Note (Signed)
Summary: trouble breathing,df   Vital Signs:  Patient profile:   45 year old female Height:      63 inches Weight:      242 pounds BMI:     43.02 O2 Sat:      97 % Temp:     98.6 degrees F oral Pulse rate:   112 / minute BP sitting:   138 / 82  (right arm) Cuff size:   large  Vitals Entered By: Jamie Brookes MD (November 26, 2009 9:41 AM) CC: SOB, labored breathing x 4days. Chest pain Is Patient Diabetic? Yes Did you bring your meter with you today? No Pain Assessment Patient in pain? no        Primary Care Abhiraj Dozal:  Priscella Mann MD  CC:  SOB and labored breathing x 4days. Chest pain.  History of Present Illness: SOB: Pt has been feeling short of breath for the last 1 week. She has been sleeping on 3 pillows at night and has started feeling "bad" which she attributed to her Lupus. She started having some mid-sternal chest pain, cough and SOB on Sun/Mon. The chest pain came after eating something late at night. It went away by morning and has come back some after that. She says the cough is non-productive, no sick contacts, feels like she did when she got PNA the last time. She is not having any fevers, chills, nausea, vomiting, diarrhea, constipation or nasal congestion. She says that she normally can walks about 20 minutes a day and was not having any problems. No heart palpitations. She does not have a h/o heart problems. She does have some lower extremity edema that is not normally there but she says she does have random swelling of joints with the lupus. She did not come in earlier because she was suppose to have an appointment with her Rheumatologist today but they canceled so she came to see me today instead.   Habits & Providers  Alcohol-Tobacco-Diet     Tobacco Status: current     Tobacco Counseling: to quit use of tobacco products     Cigarette Packs/Day: 1.0  Current Medications (verified): 1)  Prednisone 10 Mg Tabs (Prednisone) .Marland Kitchen.. 10 Mg By Mouth Daily 2)   Caltrate 600+d 600-400 Mg-Unit  Tabs (Calcium Carbonate-Vitamin D) .... Take 1 Tablet By Mouth Two Times A Day Per Dr Kellie Simmering While On Prednsione 3)  Percocet 5-325 Mg Tabs (Oxycodone-Acetaminophen) .Marland Kitchen.. 1-2 Tabs By Mouth Every 6 Hours As Needed For Pain 4)  Arixtra .... Injected Daily 5)  Hydroxychloroquine (Plaquenil)  Allergies (verified): 1)  ! Penicillin  Review of Systems        vitals reviewed and pertinent negatives and positives seen in HPI   Physical Exam  General:  Well-developed,well-nourished,in no acute distress; alert,appropriate and cooperative throughout examination Lungs:  tachypnea, lungs Clear to auscultation bilaterally, normal respiratory effort, no accessory muscle use, and normal breath sounds.   Heart:  Normal rate and regular rhythm. S1 and S2 normal without gallop, murmur, click, rub or other extra sounds. Extremities:  2-3 plus pitting edema   Impression & Recommendations:  Problem # 1:  DYSPNEA (ICD-786.05) Assessment New EKG showed no signs of STEMI, she is tachycardic at 109 bpm, She has a slight Rt axis deviation, Labs have been drawn and sent STAT, we will call her with results. Call to [phone] May consider putting patient on Lasix to decrease fluid in legs but will weight for labs to get back.  Orders: B Nat Peptide-[facility] [phone]) CBC w/Diff-[facility] [phone]) D-Dimer- [facility] [phone]) Basic Met-[facility] [phone]) 12 Lead EKG (12 Lead EKG)  Complete Medication List: 1)  Prednisone 10 Mg Tabs (Prednisone) .Marland Kitchen.. 10 mg by mouth daily 2)  Caltrate 600+d 600-400 Mg-unit Tabs (Calcium carbonate-vitamin d) .... Take 1 tablet by mouth two times a day per dr truslow while on prednsione 3)  Percocet 5-325 Mg Tabs (Oxycodone-acetaminophen) .Marland Kitchen.. 1-2 tabs by mouth every 6 hours as needed for pain 4)  Arixtra  .... Injected daily 5)  Hydroxychloroquine (plaquenil)   Patient Instructions: 1)  Pt told that we will call her with results.   Appended Document:  trouble breathing,df    Clinical Lists Changes  Orders: Added new Test order of [facility]- Est Level  3 (44010) - Signed

## 2010-05-27 NOTE — Assessment & Plan Note (Signed)
Summary: f/u pain management, extremity tingling, smoking, pruritus   Vital Signs:  Patient profile:   45 year old female Height:      63 inches Weight:      235.56 pounds BMI:     41.88 BSA:     2.07 Temp:     98.6 degrees F Pulse rate:   95 / minute BP sitting:   139 / 78  Vitals Entered By: Jone Baseman CMA (January 25, 2010 3:26 PM) CC: f/u fingertips and med refills Is Patient Diabetic? No Pain Assessment Patient in pain? yes     Location: joints Intensity: 4   Primary Kameo Bains:  Priscella Mann MD  CC:  f/u fingertips and med refills.  History of Present Illness: 1. Joint pain likely 2/2 lupus We have been having difficult time controlling this pain. Requiring 2-3 tablets of percocet 5/325 daily. Taken 70 tabs since last appt on December 30, 2009.  Pain is in all joints but worse in knees; prevents her from getting up and moving around sometimes. But pt not severely fucntionally impaired.  Denies h/o substance abuse.  2. Lupus On prednisone 10 mg and Plaquenil 200 mg two times a day prescribed by rheumatologist Dr. Lucianne Muss. Hematologist D/C'd Arixtra for thrombotic vasculopathy on December 22, 2009 considering pts improvement in skin lesions.  Pt's next appt with Dr. Lucianne Muss in 2 wks.   3. Tingling in finger tips Resolved by eating bananas daily  4. Smoking cessation Smokes 1ppd. Goal is to stop by end of this year. Big step for pt who has been smoking 20+ years and didn't consider stopping before. Now would like to stop since does not want to contribute to any more health problems.   5. Itching 2/2 lupus Takes Benadryl daily but requesting something less sedating.   Habits & Providers  Alcohol-Tobacco-Diet     Tobacco Status: quit     Tobacco Counseling: to quit use of tobacco products     Cigarette Packs/Day: 1.0  Allergies: 1)  ! Penicillin 2)  ! Kingsley Callander  Social History: Packs/Day:  1.0  Physical Exam  General:  alert, well-developed,  well-nourished, and well-hydrated.   Eyes:  vision grossly intact, pupils equal, pupils round, and pupils reactive to light.   Neck:  supple, full ROM, and no masses.   Lungs:  CTAB Heart:  RRR, no murmurs/rubs/gallops Abdomen:  soft, non-tender, and no distention.   Msk:  normal ROM, no joint tenderness, no joint swelling, no joint warmth, and no redness over joints.   Pulses:  2+ radial/DP/PT pulses Extremities:  trace pedal edema B/L Skin:  old, healed, hyperpigmented lesions; no new lesions or rashes Psych:  mellow affect (about normal for patient); not anxious/depressed appearing   Impression & Recommendations:  Problem # 1:  UNSPECIFIED ARTHROPATHY MULTIPLE SITES (ICD-716.99) Likely 2/2 lupus controlled with percocet. Has been on since November 2010, thinking pt will only need short-term and hoping steroid and Plaquenil treatments of lupus will lessen joint pain. Not the case, however. Would like to try weaning pt off percocet and try alternative pain medications. Pt seems willing to work with me and open to trying other analgesics. Will try Tramadol two times a day but will give oxycodone for breakthrough pain. Will give enough for at least 2 a day until her next appt with me in 1 month (no sooner appts possible). Also referring pt to pain clinic. Pt amenable to trying. If Tramadol doesn't help, will consider Tylenol #3, Lortab,  or Oxycontin/MS Contin and establish a pain contract.   Problem # 2:  LUPUS ERYTHEMATOSUS (ICD-695.4) Dr. Lucianne Muss managing her meds. Pt now only prednisone 10mg  and plaquenil 200 mg two times a day. Next appt with Dr. Lucianne Muss in 2 wks. Requested notes from previous appointment to gauage duration of steroid use and direction of management.   Her updated medication list for this problem includes:    Prednisone 10 Mg Tabs (Prednisone) .Marland KitchenMarland KitchenMarland KitchenMarland Kitchen 4 tabs by mouth daily for 5 days then take 1 tab by mouth daily  Orders: [facility]- Est Level  3 (99213) Pain Clinic Referral  (Pain)  Problem # 3:  PERIPHERAL NEUROPATHY (ICD-356.9) Resolved with eating bananas, although her K normal when recently checked.   Problem # 4:  TOBACCO ABUSE (ICD-305.1) Goal is to quit end of this year. Pt now on 1 ppd.   Orders: [facility]- Est Level  3 ([address]) Ophthalmology Referral (Ophthalmology)  Problem # 5:  PRURITUS (ICD-698.9) 2/2 lupus? Benadryl helps but sedating. Will give Rx for Atarx to see if we can prevent itching.   Complete Medication List: 1)  Prednisone 10 Mg Tabs (Prednisone) .... 4 tabs by mouth daily for 5 days then take 1 tab by mouth daily 2)  Caltrate 600+d 600-400 Mg-unit Tabs (Calcium carbonate-vitamin d) .... Take 1 tablet by mouth two times a day per dr truslow while on prednsione 3)  Hydroxychloroquine (plaquenil)  .... Tale 1 tablet two times a day. 4)  Metformin Hcl 500 Mg Tabs (Metformin hcl) .... Take one tablet daily. 5)  Gabapentin 100 Mg Caps (Gabapentin) .... Increase as directed until you get to 300mg  three times a day 6)  Oxycodone Hcl 5 Mg Tabs (Oxycodone hcl) .... Take 1 tablet every 12 hrs as needed for breakthrough pain. 7)  Vistaril 25 Mg Caps (Hydroxyzine pamoate) .... Take 1 tablet daily as needed for itching. 8)  Ultram 50 Mg Tabs (Tramadol hcl) .... Take 1 tablet twice a day for pain.  Patient Instructions: 1)  I would like you to follow-up with me in 2 weeks so we can re-assess your pain. I will give you 28 tablets of oxycodone (so about 2 a day). Please take the Tramadol first, twice a day, every day. Let's see if this will help your baseline pain. Only take the oxycodone for breakthrough pain.  2)  I also want you to go to the pain clinic. They will call you with an appt and once you agree to go, you should go. If you miss your appt, they will not see you again. 3)  Try the Atarax for itching.  4)  I will refer you to see an ophthalmologist (eye doctor). Since you have the orange card, it may be a while before they can see you.   Prescriptions: ULTRAM 50 MG TABS (TRAMADOL HCL) Take 1 tablet twice a day for pain.  #60 x 0   Entered and Authorized by:   Priscella Mann MD   Signed by:   Lucianne Muss Park MD on 01/25/2010   Method used:   Print then Give to Patient   RxID:   [id number] VISTARIL 25 MG CAPS (HYDROXYZINE PAMOATE) Take 1 tablet daily as needed for itching.  #30 x 1   Entered and Authorized by:   Priscella Mann MD   Signed by:   Lucianne Muss Park MD on 01/25/2010   Method used:   Electronically to        [facility] [phone]* (  retail)       [address]       [place], [place]  [address]       Ph: [phone]       Fax: [phone]   RxID:   [id number] OXYCODONE HCL 5 MG TABS (OXYCODONE HCL) Take 1 tablet every 12 hrs as needed for breakthrough pain.  #28 x 0   Entered and Authorized by:   Priscella Mann MD   Signed by:   Lucianne Muss Park MD on 01/25/2010   Method used:   Print then Give to Patient   RxID:   [id number]

## 2010-05-27 NOTE — Assessment & Plan Note (Signed)
Summary: Follow-up: arthritic pain; SLE; smoking   Primary Provider:  Priscella Mann MD   History of Present Illness: 1. Joint pain likely 2/2 SLE Patient is doing great. Pain seems to be controlled on current regimen of Tramadol & oxycodone q12hrs. Pain is 6/10 and in several joints, with worse in left elbow joint. Recent appt with Rheum a few days ago, decided to increased Prednisone to 40mg  during acute pain flares and then quickley wean back down to 10mg  daily. Patient is more active around house recently. Denies dyspnea, chest pain, dysphagia. Pain has made appointment with pain clinic, next one in April 2012 due to full clinic there.   2. Smoking Still 3/4 to 1 ppd. But did purcahse Nicotene patches.   3. Itch Vistaril did not help but pt finds that Neosporin ointment has alleviated itch.  Current Medications (verified): 1)  Prednisone 10 Mg Tabs (Prednisone) .... 4 Tabs By Mouth Daily For 5 Days Then Take 1 Tab By Mouth Daily 2)  Caltrate 600+d 600-400 Mg-Unit  Tabs (Calcium Carbonate-Vitamin D) .... Take 1 Tablet By Mouth Two Times A Day Per Dr Kellie Simmering While On Prednsione 3)  Hydroxychloroquine (Plaquenil) .... Tale 1 Tablet Two Times A Day. 4)  Metformin Hcl 500 Mg Tabs (Metformin Hcl) .... Take One Tablet Daily. 5)  Oxycodone Hcl 5 Mg Tabs (Oxycodone Hcl) .... Take 1 Tablet Every 12 Hrs As Needed For Breakthrough Pain. 6)  Ultram 50 Mg Tabs (Tramadol Hcl) .... Take 1 Tablet Twice A Day For Pain.  Allergies: 1)  ! Penicillin 2)  ! Kingsley Callander  Social History: Pt lives with boyfriend. Has been busy packing to move into new house (owned by boyfriend's aunt). Patient has been busy packing. Boyfriend is very supportive of patient's medical problems; makes sure she doesn't do too much. Patient is appreciative of him.  Smokes <1 ppd. Goal is to stop smoking the first of the year. Denies alc, drugs.  Unemployed.   Physical Exam  General:  Looking good and with more energy that  before.  Mouth:  Poor dentition and missing teeth. No oral lesions/exudates.  Lungs:  CTAB Heart:  RRR, no murmurs/rubs/gallops Abdomen:  NABS, soft, NT Msk:  No joint tenderness/erythema/swelling in hands, elbows, knees, or ankles. But pain with full flexion of left elbow.    Impression & Recommendations:  Problem # 1:  UNSPECIFIED ARTHROPATHY MULTIPLE SITES (ICD-716.99)  Patient has been very consistent with prescribed oxycodone. Feel comfortable prescribing long-term opiates for patient based on stable behavior, desire to go to pain clinic, willingness to go up on steroids for better pain control, desire to increase activity and functionality. Patient has filled out pain contract today. Will continue Tramadol and oxycodone two times a day for breakthrough pain. Will space f/u to 2 months. Pt will pick up 2 weeks worth of oxycodone at a time due to cost issues (like she has been doing). Patient with appointment at Pain Clinic 07/26/2010.   Orders: [facility]- Est Level  3 (13086)  Problem # 2:  TOBACCO ABUSE (ICD-305.1)  Encouraged efforts to stop. Brought up option of consulting with Dr. Michaelyn Barter for support/advice. She seems willing to genuinely stop for the sake of her health. Will continue to ask her about this and encourage.   Orders: [facility]- Est Level  3 (57846)  Problem # 3:  PRURITUS (ICD-698.9)  Resolved with self-treatment with Neosporin.   Orders: [facility]- Est Level  3 (96295)  Complete Medication List: 1)  Prednisone 10  Mg Tabs (Prednisone) .... 4 tabs by mouth daily for 5 days then take 1 tab by mouth daily 2)  Caltrate 600+d 600-400 Mg-unit Tabs (Calcium carbonate-vitamin d) .... Take 1 tablet by mouth two times a day per dr truslow while on prednsione 3)  Hydroxychloroquine (plaquenil)  .... Tale 1 tablet two times a day. 4)  Metformin Hcl 500 Mg Tabs (Metformin hcl) .... Take one tablet daily. 5)  Oxycodone Hcl 5 Mg Tabs (Oxycodone hcl) .... Take 1 tablet every 12 hrs as needed  for breakthrough pain. 6)  Ultram 50 Mg Tabs (Tramadol hcl) .... Take 1 tablet twice a day for pain.  Other Orders: [facility] (16109) Prescriptions: OXYCODONE HCL 5 MG TABS (OXYCODONE HCL) Take 1 tablet every 12 hrs as needed for breakthrough pain.  #28 x 0   Entered and Authorized by:   Priscella Mann MD   Signed by:   Lucianne Muss Park MD on 02/19/2010   Method used:   Historical   RxID:   [id number] OXYCODONE HCL 5 MG TABS (OXYCODONE HCL) Take 1 tablet every 12 hrs as needed for breakthrough pain.  #28 x 0   Entered and Authorized by:   Priscella Mann MD   Signed by:   Lucianne Muss Park MD on 02/19/2010   Method used:   Historical   RxID:   [id number] OXYCODONE HCL 5 MG TABS (OXYCODONE HCL) Take 1 tablet every 12 hrs as needed for breakthrough pain.  #28 x 0   Entered and Authorized by:   Priscella Mann MD   Signed by:   Lucianne Muss Park MD on 02/19/2010   Method used:   Historical   RxID:   [id number] OXYCODONE HCL 5 MG TABS (OXYCODONE HCL) Take 1 tablet every 12 hrs as needed for breakthrough pain.  #28 x 0   Entered and Authorized by:   Priscella Mann MD   Signed by:   Lucianne Muss Park MD on 02/19/2010   Method used:   Historical   RxID:   [id number] ULTRAM 50 MG TABS (TRAMADOL HCL) Take 1 tablet twice a day for pain.  #60 x 0   Entered and Authorized by:   Priscella Mann MD   Signed by:   Lucianne Muss Park MD on 02/19/2010   Method used:   Print then Give to Patient   RxID:   [id number] OXYCODONE HCL 5 MG TABS (OXYCODONE HCL) Take 1 tablet every 12 hrs as needed for breakthrough pain.  #28 x 0   Entered and Authorized by:   Priscella Mann MD   Signed by:   Lucianne Muss Park MD on 02/19/2010   Method used:   Print then Give to Patient   RxID:   [id number] METFORMIN HCL 500 MG TABS (METFORMIN HCL) Take one tablet daily.  #30 x 3   Entered and Authorized by:   Priscella Mann MD   Signed by:   Lucianne Muss Park MD on 02/19/2010   Method used:   Print then Give  to Patient   RxID:   [id number]    Orders Added: 1)  [facility]- Est Level  3 [76283] 2)  [facility] [15176]

## 2010-05-27 NOTE — Consult Note (Signed)
Summary: [facility] Rheumatology  [facility] Rheumatology   Imported By: De Nurse 01/28/2010 14:19:46  _____________________________________________________________________  External Attachment:    Type:   Image     Comment:   External Document

## 2010-05-27 NOTE — Assessment & Plan Note (Signed)
Summary: f/up,tcb   Vital Signs:  Patient profile:   45 year old female Height:      63 inches Weight:      236.9 pounds BMI:     42.12 Temp:     97.8 degrees F oral Pulse rate:   91 / minute BP sitting:   131 / 84  (left arm) Cuff size:   large  Vitals Entered By: Garen Grams LPN (October 16, 2009 10:26 AM) CC: f/u Is Patient Diabetic? No Pain Assessment Patient in pain? yes        Primary Care Provider:  Myrtie Soman  MD  CC:  f/u.  History of Present Illness: 1. vasculitis Saw Dr. Lucianne Muss on Monday. Had a good report from him. States that he has diagnosed her with lupus officially and has initiated quinine treatment. Also receiving fondiparinux injections per hematology. They feel that she has a thrombotic condition of some sort and, while there is not much literature on the subject, scan evidence has found that anticoagulation therapy has been helpful. We do not have the notes from her visit on Monday.   Down to 2 percocets per day, occasionally needs 3. Dr. Lucianne Muss dropped the dose of her prednisone to 10 mg on Monday. Pt is pleased with her progress. Feels better than she has "in a long time."  ROS:  No new skin lesions; + joint aches in knees, wrists, ankles, fingers but in general these are improved.  ** pt reports that the health department could only fill half of her percocet rx (45 instead of 90). She brings in the pill bottle which shows "disp # 45"  Habits & Providers  Alcohol-Tobacco-Diet     Tobacco Status: current     Tobacco Counseling: to quit use of tobacco products  Current Medications (verified): 1)  Glucophage 500 Mg Tabs (Metformin Hcl) .... One Daily Per Dr Kellie Simmering 2)  Prednisone 10 Mg Tabs (Prednisone) .Marland Kitchen.. 10 Mg By Mouth Daily 3)  Caltrate 600+d 600-400 Mg-Unit  Tabs (Calcium Carbonate-Vitamin D) .... Take 1 Tablet By Mouth Two Times A Day Per Dr Kellie Simmering While On Prednsione 4)  Trental 400 Mg Cr-Tabs (Pentoxifylline) .... One By Mouth Three Times  A Day Per Dr Kellie Simmering 5)  Percocet 5-325 Mg Tabs (Oxycodone-Acetaminophen) .Marland Kitchen.. 1-2 Tabs By Mouth Every 6 Hours As Needed For Pain 6)  Arixtra .... Injected Daily  Allergies (verified): 1)  ! 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 Seen initially by Dr Kellie Simmering and Dr Nicholas Lose for this. On steroids and trental. [facility] has declined to see any further, now seeing Dr. Lucianne Muss at [facility] (rheum). Recently seen at [facility] with (+) anticardiolipin antibody consistent with antiphospholipid syndrome. Per patient, Dr. Lucianne Muss has diagnosed her with lupus. -smoking - not interested in quitting  Physical Exam  General:  vital signs reviewed and normal, except obese Alert, appropriate; well-dressed and well-nourished  Lungs:  work of breathing unlabored, clear to auscultation bilaterally; no wheezes, rales, or ronchi; good air movement throughout  Heart:  regular rate and rhythm, no murmurs; normal s1/s2  Skin:  hyperpigmented, healed ulcerations spread over bilateral forearms; scarred, variably pink and hyperpigmented areas on bilateral shins from previous blistering vasculitis lesions. Improved from previous exam.    Impression & Recommendations:  Problem # 1:  VASCULITIS (ICD-447.6)  Will try to get notes from Dr. Lucianne Muss to firm up diagnosis of lupus. Updated med list to reflect  prednisone dose and fondiparinux. Appears that blood thinner and quinine therapy have been helpful.  Will refil percocet to give her 75 total for the  month of June. Discussed that we would continue to try to wean this medication. She is agreeable and appropriate. Would recommend given 60-75 tabs for the month of July (she'll probably need a refill near the middle of the month) .  Orders: [facility]- Est Level  3 (11914)  Complete Medication List: 1)  Glucophage 500 Mg Tabs (Metformin hcl) .... One daily per dr truslow 2)  Prednisone 10  Mg Tabs (Prednisone) .Marland Kitchen.. 10 mg by mouth daily 3)  Caltrate 600+d 600-400 Mg-unit Tabs (Calcium carbonate-vitamin d) .... Take 1 tablet by mouth two times a day per dr truslow while on prednsione 4)  Trental 400 Mg Cr-tabs (Pentoxifylline) .... One by mouth three times a day per dr truslow 5)  Percocet 5-325 Mg Tabs (Oxycodone-acetaminophen) .Marland Kitchen.. 1-2 tabs by mouth every 6 hours as needed for pain 6)  Arixtra  .... Injected daily  Patient Instructions: 1)  I've refilled the percocet for # 30 to give you a total of 75 from your last rx.  2)  We'll slowly work towards getting you off this medicine eventually. 3)  follow-up in 1-2 months to meet your new doctor (Dr. Willaim Bane).  Prescriptions: PERCOCET 5-325 MG TABS (OXYCODONE-ACETAMINOPHEN) 1-2 tabs by mouth every 6 hours as needed for pain  #30 x 0   Entered and Authorized by:   Myrtie Soman  MD   Signed by:   Myrtie Soman  MD on 10/16/2009   Method used:   Print then Give to Patient   RxID:   [id number]

## 2010-05-27 NOTE — Assessment & Plan Note (Signed)
Summary: fingertips burn/[place]   Vital Signs:  Patient profile:   45 year old female Height:      63 inches Weight:      228 pounds BMI:     40.53 BSA:     2.05 Temp:     98.4 degrees F Pulse rate:   111 / minute BP sitting:   168 / 95  Vitals Entered By: Jone Baseman CMA (December 18, 2009 2:29 PM) CC: fingers burning x 5 days Is Patient Diabetic? No Pain Assessment Patient in pain? yes     Location: finger tips Intensity: 10   Primary Care Provider:  Priscella Mann MD  CC:  fingers burning x 5 days.  History of Present Illness: 1. Fingertip pain and burning: - Has had this for the past 5 days - Described as pain and burning. - Rated a 10/10 - Percocet helps a little bit - Keeps her up at night - Located in the fingertips of her 4 fingers on both hands - Has periods that are pain free  PMHx: 1. Lupus - recent hospitalization for pericaridial effusion.  She saw her rheumatologist about this on Monday who was not concerned and did not change any of her medicines.  ROS: denies cyanosis, white, or cool extremities.  Denies fevers.  Denies numbness / weakness  Habits & Providers  Alcohol-Tobacco-Diet     Tobacco Status: quit     Tobacco Counseling: to quit use of tobacco products     Cigarette Packs/Day: 0.75  Current Medications (verified): 1)  Prednisone 10 Mg Tabs (Prednisone) .... 4 Tabs By Mouth Daily For 5 Days Then Take 1 Tab By Mouth Daily 2)  Caltrate 600+d 600-400 Mg-Unit  Tabs (Calcium Carbonate-Vitamin D) .... Take 1 Tablet By Mouth Two Times A Day Per Dr Kellie Simmering While On Prednsione 3)  Percocet 5-325 Mg Tabs (Oxycodone-Acetaminophen) .Marland Kitchen.. 1-2 Tabs By Mouth Every 6 Hours As Needed For Pain 4)  Hydroxychloroquine (Plaquenil) .... Tale 1 Tablet Two Times A Day. 5)  Metformin Hcl 500 Mg Tabs (Metformin Hcl) .... Take One Tablet Daily. 6)  Gabapentin 100 Mg Caps (Gabapentin) .... Increase As Directed Until You Get To 300mg  Three Times A  Day  Allergies: 1)  ! Penicillin 2)  ! Kingsley Callander  Past History:  Past Medical History: Reviewed history from 10/01/2009 and no changes required. 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 Seen initially by Dr Kellie Simmering and Dr Nicholas Lose for this. On steroids and trental. [facility] has declined to see any further, now seeing Dr. Lucianne Muss at [facility] (rheum). Recently seen at [facility] with (+) anticardiolipin antibody consistent with antiphospholipid syndrome. Per patient, Dr. Lucianne Muss has diagnosed her with lupus. -smoking - not interested in quitting  Social History: Reviewed history from 12/10/2009 and no changes required. Pt lives with boyfriend.  Smokes <1 ppd. Goal is to stop smoking the first of the year. Denies alc, drugs.  Unemployed. Smoking Status:  quit Packs/Day:  0.75  Physical Exam  General:  Vitals reviewed.  Comfortable, No acute distress Lungs:  CTAB, no w/r/r Heart:  RRR, no S3/S4, no murmurs, gallops, or rubs; no lifts/heaves/thrills Extremities:  Fingers are normal appearing.  ? warmth of fingertips.  No swelling, cyanosis.  Good strength.  Decreased sensation in fingertips bilaterally to soft touch.   Impression & Recommendations:  Problem # 1:  PERIPHERAL NEUROPATHY (ICD-356.9) Assessment New Unsure of the cause of this.  She is not diabetic.  Will check some labs.  Will increased Prednisone in case it is related to Lupus.  Will also start Neurontin since it sounds like neuropathy.  Consider nerve conduction studies at some point. Orders: B12-[facility] ([id number]) TSH-[facility] [id number]) Basic Met-[facility] [id number]) Magnesium-[facility] [id number]) [facility]- Est  Level 4 (36644)  Complete Medication List: 1)  Prednisone 10 Mg Tabs (Prednisone) .... 4 tabs by mouth daily for 5 days then take 1 tab by mouth daily 2)  Caltrate 600+d 600-400 Mg-unit Tabs (Calcium carbonate-vitamin d) .... Take 1 tablet by mouth two times a day per dr truslow while on prednsione 3)   Percocet 5-325 Mg Tabs (Oxycodone-acetaminophen) .Marland Kitchen.. 1-2 tabs by mouth every 6 hours as needed for pain 4)  Hydroxychloroquine (plaquenil)  .... Tale 1 tablet two times a day. 5)  Metformin Hcl 500 Mg Tabs (Metformin hcl) .... Take one tablet daily. 6)  Gabapentin 100 Mg Caps (Gabapentin) .... Increase as directed until you get to 300mg  three times a day  Other Orders: CRP, high sensitivity-[facility] ([id number])  Patient Instructions: 1)  We will check some labs to see if we can figure out what is causing your fingers to burn and tingle 2)  I would like for you to take 40 mg of prednisone for the next 5 days and see if that helps 3)  For the neurontin take it like this: 4)  1. 100mg  at night for 4 days 5)  2. 200mg  at night for 4 days 6)  3. 300mg  at night for 4 days 7)  4. 300mg  at nigth and 100mg  in the morning for 4 days 8)  5. 300mg  at night and 200mg  in the morning for 4 days 9)  6. 300mg  at night and 300mg  in the morning for 7 days 10)  7. 300mg  three times a day 11)  Please schedule a follow up appointment with Dr. Madolyn Frieze in 2-3 weeks Prescriptions: PERCOCET 5-325 MG TABS (OXYCODONE-ACETAMINOPHEN) 1-2 tabs by mouth every 6 hours as needed for pain  #15 x 0   Entered and Authorized by:   Angelena Sole MD   Signed by:   Angelena Sole MD on 12/18/2009   Method used:   Print then Give to Patient   RxID:   [id number] PREDNISONE 10 MG TABS (PREDNISONE) 4 tabs by mouth daily for 5 days then take 1 tab by mouth daily  #60 x 0   Entered and Authorized by:   Angelena Sole MD   Signed by:   Angelena Sole MD on 12/18/2009   Method used:   Electronically to        [facility] [phone]* (retail)       [address]       [place], [place]  [address]       Ph: [phone]       Fax: [phone]   RxID:   [id number] GABAPENTIN 100 MG CAPS (GABAPENTIN) increase as directed until you get to 300mg  three times a day  #180 x 0   Entered and Authorized by:   Angelena Sole MD   Signed by:   Angelena Sole MD on 12/18/2009   Method used:   Electronically to        [facility] [phone]* (retail)       [address]       [place], [place]  [address]       Ph: [phone]       Fax: [phone]   RxID:   [id number]

## 2010-05-27 NOTE — Miscellaneous (Signed)
  Clinical Lists Changes Medical Record Request Received medical record request Records went to [facility] on 03/06/2009 [facility] Pysher  March 26, 2010 2:51 PM

## 2010-05-27 NOTE — Progress Notes (Signed)
Summary: Rx Req Percocet  Phone Note Refill Request Call back at [phone] Message from:  Patient  Refills Requested: Medication #1:  PERCOCET 5-325 MG TABS 1-2 tabs by mouth every 6 hours as needed for pain pt sts we only gave her enough for 2 weeks & MD did not have an opening until Oct. 3  Initial call taken by: Clydell Hakim,  January 11, 2010 12:03 PM  Follow-up for Phone Call        pt has been out for 4 days and needs this asap  Additional Follow-up for Phone Call Additional follow up Details #1::        to preceptor Additional Follow-up by: Golden Circle RN,  January 14, 2010 4:01 PM    Additional Follow-up for Phone Call Additional follow up Details #2::    Send to her  primary doctor, Dr Madolyn Frieze  Follow-up by: Tawanna Cooler McDiarmid MD,  January 14, 2010 4:48 PM  Prescriptions: PERCOCET 5-325 MG TABS (OXYCODONE-ACETAMINOPHEN) 1-2 tabs by mouth every 6 hours as needed for pain  #10 x 0   Entered by:   Priscella Mann MD   Authorized by:   Tawanna Cooler McDiarmid MD   Signed by:   Lucianne Muss Park MD on 01/20/2010   Method used:   Print then Give to Patient   RxID:   [id number]  Called number and spoke to her boyfriend (patient unavailable). Told him to ask her to pick up Rx @ clinic.   Appended Document: Rx Req Percocet I have been asked by the front office to reprint this prescription, as the one printed and signed yesterday by Dr McDiarmid was cut off on the L margin.  I have discarded the original copy and handwritten a Rx and given to front desk [place] [employer]. Paula Compton, MD

## 2010-05-27 NOTE — Consult Note (Signed)
Summary: [facility]   Imported By: Bradly Bienenstock 07/28/2009 09:30:13  _____________________________________________________________________  External Attachment:    Type:   Image     Comment:   External Document

## 2010-05-27 NOTE — Miscellaneous (Signed)
Summary: Handicap placard denied  pt dropped off form to be completed, gave to Terese Door for any clinical completion. [name]  March 09, 2010 3:46 PM  Handicap Placard form placed in Dr. Bluford Kaufmann Park's box for completion/signature...........Marland Kitchen Terese Door  March 09, 2010 3:51 PM  Called patient and notified her that I cannot sign form based on her not meeting criteria.

## 2010-05-27 NOTE — Progress Notes (Signed)
Summary: Rx refill oxycodone  Phone Note Refill Request Call back at [phone] Message from:  Patient  Refills Requested: Medication #1:  OXYCODONE HCL 5 MG TABS Take 1 tablet every 12 hrs as needed for breakthrough pain. Please call when ready - needs Monday 17th  Initial call taken by: De Nurse,  February 03, 2010 12:00 PM    Prescriptions: OXYCODONE HCL 5 MG TABS (OXYCODONE HCL) Take 1 tablet every 12 hrs as needed for breakthrough pain.  #24 x 0   Entered and Authorized by:   Priscella Mann MD   Signed by:   Lucianne Muss Park MD on 02/06/2010   Method used:   Handwritten   RxID:   [id number]

## 2010-05-27 NOTE — Consult Note (Signed)
Summary: Rheumatology/Immuno  Rheumatology/Immuno   Imported By: Clydell Hakim 10/21/2009 15:30:36  _____________________________________________________________________  External Attachment:    Type:   Image     Comment:   External Document

## 2010-05-27 NOTE — Progress Notes (Signed)
Summary: Rx Req  Phone Note Refill Request Call back at Home Phone [phone] Message from:  Patient  Refills Requested: Medication #1:  PERCOCET 5-325 MG TABS 1-2 tabs by mouth every 6 hours as needed for pain Initial call taken by: Clydell Hakim,  September 30, 2009 9:27 AM  Follow-up for Phone Call        spoke with patient. States that Dr. Lucianne Muss has diagnosed her with lupus and started a medicine for pain that should start to improve her symptoms in the next 6-8 weeks. Down to 2-3 percocets per day and thinks that her pain is improved. Will try to get records from Dr. Lucianne Muss and will refill percocet for 1 month at 3 pills per day. Pt in agreement.  Follow-up by: Myrtie Soman  MD,  September 30, 2009 10:00 AM    New/Updated Medications: PREDNISONE 10 MG TABS (PREDNISONE) 12.5 mg daily Prescriptions: PERCOCET 5-325 MG TABS (OXYCODONE-ACETAMINOPHEN) 1-2 tabs by mouth every 6 hours as needed for pain  #90 x 0   Entered and Authorized by:   Myrtie Soman  MD   Signed by:   Myrtie Soman  MD on 09/30/2009   Method used:   Print then Give to Patient   RxID:   [id number]   Appended Document: Rx Req    Clinical Lists Changes  Problems: Removed problem of INCREASED BLOOD PRESSURE (ICD-796.2) Added new problem of LUPUS ERYTHEMATOSUS (ICD-695.4) Observations: Added new observation of PAST MED HX: -obesity -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 Seen initially by Dr Kellie Simmering and Dr Nicholas Lose for this. On steroids and trental. [facility] has declined to see any further, now seeing Dr. Lucianne Muss at [facility] (rheum). Recently seen at [facility] with (+) anticardiolipin antibody consistent with antiphospholipid syndrome. Per patient, Dr. Lucianne Muss has diagnosed her with lupus. -smoking - not interested in quitting (10/01/2009 11:54) Added new observation of PRIMARY MD: Myrtie Soman  MD (10/01/2009 11:54)        Past History:  Past Medical History: -obesity -thrombtic vasculitis dx  Jan-March 2010 - etiology unknown. Seen initially by Dr Kellie Simmering and Dr Nicholas Lose for this. On steroids and trental. [facility] has declined to see any further, now seeing Dr. Lucianne Muss at [facility] (rheum). Recently seen at [facility] with (+) anticardiolipin antibody consistent with antiphospholipid syndrome. Per patient, Dr. Lucianne Muss has diagnosed her with lupus. -smoking - not interested in quitting  Appended Document: Rx Req [facility] states they do not have Percocet anymore and needs to know what else she can take in substitution??  Appended Document: Rx Req to PCP  Appended Document: Rx Req PCP out of office, left message with [facility] to find out what they do have avail for  the preceptor to write Rx, also stated in message that Pt would need to bring written Rx back to [place] per preceptor  Appended Document: Rx Req Spoke with [facility] they have Vicodn 5-500mg , Vicoden 7.75-550mg  or Vicoden 5-325mg .  They returned Percocet Rx to pt, tried to call pt to make sure she did not fill Percocet at another pharm and to have her return Percocet RX,  Left message for her to return call.

## 2010-05-27 NOTE — Assessment & Plan Note (Signed)
Summary: MEET NEW DR/KH   Vital Signs:  Patient profile:   45 year old female Height:      63 inches Temp:     98.9 degrees F oral Pulse rate:   82 / minute BP sitting:   149 / 86  (left arm) Cuff size:   regular  Vitals Entered By: Garen Grams LPN (November 05, 2009 4:09 PM) CC: Meet New MD Is Patient Diabetic? No   Primary Provider:  Myrtie Soman  MD  CC:  Meet New MD.  History of Present Illness: This is a 45 year old female who came to become acquainted with me and for refills. She has been recently diagnosed with lupus by her hematologist, Dr Lorrin Mais at [facility]. Before that, she had experienced 16-18 months of severe joint pain and acute onset of blisters throughout her body that became ulcerous and did not heal.  She is now seeing a rheumatologist, Dr. Lucianne Muss, at [facility] as well. After being diagnosed with lupus, she was started on Plaquenil and has been on it for 4 weeks. She has no new blisters, but her joint pain is about the same. It is constantly a 6-7/10 pain and intermittently increases to 10/10 and is worse at night. She would also like refills on her metformin and percocet.  Preventive Screening-Counseling & Management  Alcohol-Tobacco     Smoking Status: current     Smoking Cessation Counseling: yes  Current Medications (verified): 1)  Glucophage 500 Mg Tabs (Metformin Hcl) .... One Daily Per Dr Kellie Simmering 2)  Prednisone 10 Mg Tabs (Prednisone) .Marland Kitchen.. 10 Mg By Mouth Daily 3)  Caltrate 600+d 600-400 Mg-Unit  Tabs (Calcium Carbonate-Vitamin D) .... Take 1 Tablet By Mouth Two Times A Day Per Dr Kellie Simmering While On Prednsione 4)  Percocet 5-325 Mg Tabs (Oxycodone-Acetaminophen) .Marland Kitchen.. 1-2 Tabs By Mouth Every 6 Hours As Needed For Pain 5)  Arixtra .... Injected Daily 6)  Hydroxychloroquine (Plaquenil)  Allergies (verified): 1)  ! Penicillin  Physical Exam  General:  alert and well-developed.   Chest Wall:  no deformities.   Lungs:  normal respiratory effort and normal breath  sounds.   Heart:  normal rate, regular rhythm, and no murmur.   Abdomen:  obese, soft, non-tender, normal bowel sounds, and no distention Msk:  Some joint tenderness, no erythema or redness, some crepitation over her knees bilaterally. Extremities:  No pedal edema. Additional Exam:  Skin: she has scars throughout her body from the blisters. None of these are new, erythematous, or draining.   Impression & Recommendations:  Problem # 1:  LUPUS ERYTHEMATOSUS (ICD-695.4) We talked for a while about her history of her pains and her use of percocet for pain control. She understands that the goal is to lower her percocet use when, hopefully, the treatment for her lupus takes affect. Her last PCP prescribed 75 tablets of percocet for a month. I gave her 30 tablets for 2 weeks. She is to call to request another 30 tablets in 2 weeks. We split up the prescription this way so that she could afford to fill a prescription. I will see her again in a month to re-asses her pain and to hopefully decrease her percocet use. She is amenable to this and to trying OTC medications, such as a arthritis-strength (650mg ) Tylenol. I also encouraged her to become more active. She used to be active but has slowed down due to the pain.  Also, since she is being weaned off of the steroids, we decided to stop  her metformin. She was on the lowest dose, and her sugars had been in the low 100's at home. I told her to continue to check her sugars and that we would re-assess her need for metformin at her follow-up visit.  Her updated medication list for this problem includes:    Prednisone 10 Mg Tabs (Prednisone) .Marland KitchenMarland KitchenMarland KitchenMarland Kitchen 10 mg by mouth daily  Orders: [facility]- Est Level  3 (16109)  Complete Medication List: 1)  Glucophage 500 Mg Tabs (Metformin hcl) .... One daily per dr truslow 2)  Prednisone 10 Mg Tabs (Prednisone) .Marland Kitchen.. 10 mg by mouth daily 3)  Caltrate 600+d 600-400 Mg-unit Tabs (Calcium carbonate-vitamin d) .... Take 1 tablet  by mouth two times a day per dr truslow while on prednsione 4)  Percocet 5-325 Mg Tabs (Oxycodone-acetaminophen) .Marland Kitchen.. 1-2 tabs by mouth every 6 hours as needed for pain 5)  Arixtra  .... Injected daily 6)  Hydroxychloroquine (plaquenil)   Patient Instructions: 1)  I am glad that they found a diagnosis for your pain. I am hoping you will feel better with the plaquenil treatments. I will give a prescription for 30 tablets of percocet for 2 weeks. Please call for a re-fill for the next 2 weeks. I would like to see you again in a month so we could discuss how much pain medication is right for you. I hope that as the plaquenil takes affect, you will need less pain medication. 2)  Since you are being weaned off of the steroids are are on a low dose now, let us try stopping the metformin. Please record your sugars at home and bring these with you to your next appointment with me in a month. At your next appointment, please do not eat anything before so I can check your sugars. 3)  Please try to stay active. It will help you to stay fit and to keep your bones and muscles healthy. (I hope you try the water aerobics or some other form of gentle exercise). 4)  I admire your attitude.  Prescriptions: PERCOCET 5-325 MG TABS (OXYCODONE-ACETAMINOPHEN) 1-2 tabs by mouth every 6 hours as needed for pain  #30 x 0   Entered and Authorized by:   Priscella Mann MD   Signed by:   Lucianne Muss [place] MD on 11/05/2009   Method used:   Print then Give to Patient   RxID:   [id number]

## 2010-05-27 NOTE — Assessment & Plan Note (Signed)
Summary: [name]   Vital Signs:  Patient profile:   45 year old female Height:      63 inches Weight:      230 pounds BMI:     40.89 BSA:     2.05 Temp:     98.7 degrees F Pulse rate:   75 / minute BP sitting:   138 / 87  Vitals Entered By: Jone Baseman CMA (June 30, 2009 1:47 PM) CC: f/u [facility] Is Patient Diabetic? No Pain Assessment Patient in pain? yes     Location: legs Intensity: 7   Primary Care Provider:  Myrtie Soman  MD  CC:  f/u [facility] [place].  History of Present Illness: 1. vasculitis Carries a diagnosis of leukocytoclastic vasculitis based on biopsy and lab results. Has had rhem labs from 2/10 that showed a positive ANA, P-ANCA, mildly elevated IgA. On prednisone 20 mg daily now since last visit. States that when she drops down to 10 mg daily her pain signficantly worsens and limits her ability to attend to ADLs. Since last visit she has seen Dr. Lucianne Muss at [facility] who "ran a bunch of tests" (saw him 2/28) and kept her on the prednisone 20 mg. Has an appt there 3/28 for follow-up. Also given meloxicam at last visit. States that stiffness is improved on meloxicam but pain is still constant. Taking percocet which keeps the pain manageable --takes about 4-5 tablets total per day.   **unfortunately, Dr. Kellie Simmering has decided that he doesn't have anything more to offer [name] and will not see her again.   2. sore throat Has had a sore throat for the last 1-2 days. Feels like she's about to "catch a cold."  ROS: denies chest pain or shortness of breath, no nausea, vomting, diarrhea   Habits & Providers  Alcohol-Tobacco-Diet     Tobacco Status: current     Tobacco Counseling: to quit use of tobacco products     Cigarette Packs/Day: 1.0  Current Medications (verified): 1)  Glucophage 500 Mg Tabs (Metformin Hcl) .... One Daily Per Dr Kellie Simmering 2)  Prednisone 10 Mg Tabs (Prednisone) .... Take 2 Tabs Daily For 2 Weeks Then Go Back To 1 Tab Daily 3)  Caltrate 600+d  600-400 Mg-Unit  Tabs (Calcium Carbonate-Vitamin D) .... Take 1 Tablet By Mouth Two Times A Day Per Dr Kellie Simmering While On Prednsione 4)  Trental 400 Mg Cr-Tabs (Pentoxifylline) .... One By Mouth Three Times A Day Per Dr Kellie Simmering 5)  Percocet 5-325 Mg Tabs (Oxycodone-Acetaminophen) .Marland Kitchen.. 1-2 Tabs By Mouth Every 6 Hours As Needed For Pain 6)  Flexeril 10 Mg Tabs (Cyclobenzaprine Hcl) .... 1/2 Tablet As Needed 7)  Clobetasol Propionate 0.05 % Crea (Clobetasol Propionate) .... Apply To Affected Areas Two Times A Day Until Not Inflamed 8)  Meloxicam 7.5 Mg Tabs (Meloxicam) .... Take One By Mouth Daily For Inflammation 9)  Percocet 5-325 Mg Tabs (Oxycodone-Acetaminophen) .... Take 1-2 Tablets By Mouth Every 4-6 Hours As Needed For Pain; Do Not Fill Until 05/18/09  Allergies (verified): 1)  ! 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 Seen initially by Dr Kellie Simmering and Dr Nicholas Lose for this. On steroids and trental. [facility] has declined to see any further, now seeing Dr. Lucianne Muss at [facility] (rheum). -smoking - not interested in quitting  Social History: Pt lives with mother - Jiles Prows who is also a pt at [employer]. 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 Good grip strength.   Impression & Recommendations:  Problem # 1:  VASCULITIS (ICD-447.6) Assessment Unchanged  will try  to get records from Dr. Lucianne Muss. continue prednisone, meloxicam, percocet. Reiterated that percocet was not a good long-term option and asked her to specifically discuss pain control with Dr. Lucianne Muss. She is agreeable to this and very reasonable when discussing the risks of long-term narcotic use. Not ESR, CBC and CMP from recent visit all normal.   Carries a diagnosis of leukocytoclastic vasculitis based on biopsy and lab results. Has had rhem labs from 2/10 that showed a positive ANA, P-ANCA, mildly elevated IgA.  Orders: [facility]- Est Level  3 (46568)  Problem # 2:  SORE THROAT (ICD-462) strep negative. Advised pt that she should avoid other NSAIDs and extra tylenol given her current meds.  Her updated medication list for this problem includes:    Meloxicam 7.5 Mg Tabs (Meloxicam) .Marland Kitchen... Take one by mouth daily for inflammation  Orders: Rapid Strep-[facility] (12751) [facility]- Est Level  3 (70017)  Complete Medication List: 1)  Glucophage 500 Mg Tabs (Metformin hcl) .... One daily per dr truslow 2)  Prednisone 10 Mg Tabs (Prednisone) .... Take 2 tabs daily for 2 weeks then go back to 1 tab daily 3)  Caltrate 600+d 600-400 Mg-unit Tabs (Calcium carbonate-vitamin d) .... Take 1 tablet by mouth two times a day per dr truslow while on prednsione 4)  Trental 400 Mg Cr-tabs (Pentoxifylline) .... One by mouth three times a day per dr truslow 5)  Percocet 5-325 Mg Tabs (Oxycodone-acetaminophen) .Marland Kitchen.. 1-2 tabs by mouth every 6 hours as needed for pain 6)  Flexeril 10 Mg Tabs (Cyclobenzaprine hcl) .... 1/2 tablet as needed 7)  Clobetasol Propionate 0.05 % Crea (Clobetasol propionate) .... Apply to affected areas two times a day until not inflamed 8)  Meloxicam 7.5 Mg Tabs (Meloxicam) .... Take one by mouth daily for inflammation 9)  Percocet 5-325 Mg Tabs (Oxycodone-acetaminophen) .... Take 1-2 tablets by mouth every 4-6 hours as needed for pain; do not fill until 05/18/09  Patient Instructions: 1)  The percocet should  last you until your next visit with Dr. Lucianne Muss. 2)  We'll try to get the notes from your visit with him. 3)  follow-up with me in 2 months or earlier if needed. Prescriptions: PERCOCET 5-325 MG TABS (OXYCODONE-ACETAMINOPHEN) 1-2 tabs by mouth every 6 hours as needed for pain  #30 x 0   Entered and Authorized by:   Myrtie Soman  MD   Signed by:   Myrtie Soman  MD on 06/30/2009   Method used:   Print then Give to Patient   RxID:   [id number] GLUCOPHAGE 500 MG TABS (METFORMIN HCL) one daily per Dr Kellie Simmering  #30 x 0   Entered and Authorized by:   Myrtie Soman  MD   Signed by:   Myrtie Soman  MD on 06/30/2009   Method used:   Print then Give to Patient   RxID:   [id number]   Laboratory  Results  Date/Time Received: June 30, 2009 2:06 PM  Date/Time Reported: June 30, 2009 2:26 PM   Other Tests  Rapid Strep: negative Comments: ...............test performed by......Marland KitchenBonnie A. [place], MLS (ASCP)cm

## 2010-05-27 NOTE — Consult Note (Signed)
Summary: [facility]   Imported By: Bradly Bienenstock 07/08/2009 17:10:07  _____________________________________________________________________  External Attachment:    Type:   Image     Comment:   External Document

## 2010-05-27 NOTE — Assessment & Plan Note (Signed)
Summary: [name]   Vital Signs:  Patient profile:   45 year old female Height:      63 inches Weight:      232 pounds BMI:     41.25 O2 Sat:      96 % on Room air Temp:     98.5 degrees F oral Pulse rate:   109 / minute BP sitting:   152 / 82  (left arm) Cuff size:   large  Vitals Entered By: Jimmy Footman, CMA (December 03, 2009 3:33 PM) CC: hospital f/u Is Patient Diabetic? Yes   Primary Provider:  Priscella Mann MD  CC:  hospital f/u.  History of Present Illness: 1. Pericarditis Shortness of breath some slow improvement since hospital D/C 2 days ago. Still SOB though after several steps. Not as active as before; using computer and sitting around most of day.  No chest pain, heart palpitations, sating 96% RA.  2. Dizziness Feels this is 2/2 Lasix. Dizzy soon after taking and must sit down for 1 hr. No falls, no fainting.   3. Diabetes Sugars in hospital were 130-270's.    Current Medications (verified): 1)  Prednisone 5 Mg Tabs (Prednisone) .... Take 3 Tablets A Day. 2)  Caltrate 600+d 600-400 Mg-Unit  Tabs (Calcium Carbonate-Vitamin D) .... Take 1 Tablet By Mouth Two Times A Day Per Dr Kellie Simmering While On Prednsione 3)  Percocet 5-325 Mg Tabs (Oxycodone-Acetaminophen) .Marland Kitchen.. 1-2 Tabs By Mouth Every 6 Hours As Needed For Pain 4)  Arixtra .... Injected Daily 5)  Hydroxychloroquine (Plaquenil) .... Tale 1 Tablet Two Times A Day. 6)  Furosemide 40 Mg Tabs (Furosemide) .... Take 1 Two Times A Day. 7)  Flexeril 10 Mg Tabs (Cyclobenzaprine Hcl) .... 2 Tablets At Bedtime. 8)  Indomethacin 50 Mg Caps (Indomethacin) .... Take 1 Tablets Every 12 Hrs.  Allergies (verified): 1)  ! Penicillin 2)  ! Kingsley Callander  Physical Exam  General:  alert, well-developed, and well-nourished.   Neck:  No JVD Lungs:  Decrased breath sounds but CTAB with no crackles Heart:  Tachycardic, RRR, no murmurs  Abdomen:  NABS, soft, nontender, nondistended Pulses:  2+ DP pulses Extremities:  1-2+ pedal  edema No calf tenderness   Impression & Recommendations:  Problem # 1:  OTHER ACUTE PERICARDITIS (ICD-420.99) Cont indomethacin. Consider adding colchicine if symptoms not improved at follow-up in 1 wk. Discussed with pt about Lasix. Pt is tachycardic but want to continue her on same Lasix dose due to pedal edema and persistant SOB.  Pt did not want K checked, so told her to just take K supplements 20 mEq. Will see Cards next wk.  Orders: [facility]- Est Level  3 (75643)  Problem # 2:  HYPERGLYCEMIA (ICD-790.29) Re-started on metformin due to hyperglycemia in hospital and since still on steroids. Rx for it and Glc strips.  Her updated medication list for this problem includes:    Metformin Hcl 500 Mg Tabs (Metformin hcl) .Marland Kitchen... Take one tablet daily.  Problem # 3:  LUPUS ERYTHEMATOSUS (ICD-695.4) Will see Rheum on August 22nd.  Her updated medication list for this problem includes:    Prednisone 5 Mg Tabs (Prednisone) .Marland Kitchen... Take 3 tablets a day.    Indomethacin 50 Mg Caps (Indomethacin) .Marland Kitchen... Take 1 tablets every 12 hrs.  Orders: [facility]- Est Level  3 (32951)  Complete Medication List: 1)  Prednisone 5 Mg Tabs (Prednisone) .... Take 3 tablets a day. 2)  Caltrate 600+d 600-400 Mg-unit Tabs (Calcium carbonate-vitamin d) .Marland KitchenMarland KitchenMarland Kitchen  Take 1 tablet by mouth two times a day per dr truslow while on prednsione 3)  Percocet 5-325 Mg Tabs (Oxycodone-acetaminophen) .Marland Kitchen.. 1-2 tabs by mouth every 6 hours as needed for pain 4)  Arixtra  .... Injected daily 5)  Hydroxychloroquine (plaquenil)  .... Tale 1 tablet two times a day. 6)  Furosemide 40 Mg Tabs (Furosemide) .... Take 1 two times a day. 7)  Flexeril 10 Mg Tabs (Cyclobenzaprine hcl) .... 2 tablets at bedtime. 8)  Indomethacin 50 Mg Caps (Indomethacin) .... Take 1 tablets every 12 hrs. 9)  Metformin Hcl 500 Mg Tabs (Metformin hcl) .... Take one tablet daily.  Patient Instructions: 1)  I am so glad to see you out of the hospital. Regarding your  shortness of breath, since you are improving (although slowly), I am not going to increase your dose of indomethacin. Please just continue to take it as is. I would like to see you again in a week and we can re-evaluate from there. 2)  I also would like you to continue to take the Lasix 40 mg two times a day for the next 3 days. On the 3rd day, decrease it to 40 mg once a day and then take it only once a day every other day. If your shortness of breath worsens, please call the clinic or just increase it back to 40 mg twice a day. On the other hand, if your dizzines worsens and you faint, please call the clinic or go to the ED. 3)  I also want to check your potassium today because Lasix can make your potassium low. If it is low, I will call you and will put on you potassium supplements. 4)  Please keep your legs elevated and try to get support stockings. 5)  I also would like you to take the metformin again for your sugars.  Prescriptions: METFORMIN HCL 500 MG TABS (METFORMIN HCL) Take one tablet daily.  #30 x 3   Entered and Authorized by:   Priscella Mann MD   Signed by:   Lucianne Muss Park MD on 12/03/2009   Method used:   Print then Give to Patient   RxID:   [id number] FLEXERIL 10 MG TABS (CYCLOBENZAPRINE HCL) 2 tablets at bedtime.  #60 x 0   Entered and Authorized by:   Priscella Mann MD   Signed by:   Lucianne Muss [place] MD on 12/03/2009   Method used:   Print then Give to Patient   RxID:   [id number] PERCOCET 5-325 MG TABS (OXYCODONE-ACETAMINOPHEN) 1-2 tabs by mouth every 6 hours as needed for pain  #30 x 0   Entered and Authorized by:   Priscella Mann MD   Signed by:   Lucianne Muss [place] MD on 12/03/2009   Method used:   Print then Give to Patient   RxID:   [id number]

## 2010-05-27 NOTE — Assessment & Plan Note (Signed)
Summary: rt leg pain/[facility]  Pt left without rescheduling ............................................... Delora Fuel May 14, 2010 4:13 PM   Allergies: 1)  ! Penicillin 2)  ! Kingsley Callander   Complete Medication List: 1)  Prednisone 10 Mg Tabs (Prednisone) .... 4 tabs by mouth daily for 5 days then take 1 tab by mouth daily 2)  Caltrate 600+d 600-400 Mg-unit Tabs (Calcium carbonate-vitamin d) .... Take 1 tablet by mouth two times a day per dr truslow while on prednsione 3)  Hydroxychloroquine (plaquenil)  .... Tale 1 tablet two times a day. 4)  Metformin Hcl 500 Mg Tabs (Metformin hcl) .... Take one tablet daily. 5)  Oxycodone Hcl 5 Mg Tabs (Oxycodone hcl) .... Take 1 tablet every 12 hrs as needed for breakthrough pain. 6)  Ultram 50 Mg Tabs (Tramadol hcl) .... Take 1 tablet twice a day for pain.  Other Orders: No Charge Patient Arrived (NCPA0) (NCPA0)   Orders Added: 1)  No Charge Patient Arrived (NCPA0) [NCPA0]

## 2010-05-27 NOTE — Miscellaneous (Signed)
Summary: Pain Contract  Pain Contract   Imported By: Knox Royalty 02/26/2010 09:36:05  _____________________________________________________________________  External Attachment:    Type:   Image     Comment:   External Document

## 2010-05-27 NOTE — Assessment & Plan Note (Signed)
Summary: f/u,tcb   Vital Signs:  Patient profile:   45 year old female Height:      63 inches Weight:      227.3 pounds BMI:     40.41 Temp:     98.1 degrees F oral Pulse rate:   101 / minute BP sitting:   138 / 90  Vitals Entered By: Garen Grams LPN (May 15, 2009 2:04 PM) CC: f/u pain all over Is Patient Diabetic? No Pain Assessment Patient in pain? yes     Location: all over   Primary Care Provider:  Myrtie Soman  MD  CC:  f/u pain all over.  History of Present Illness: 1. vasculitis Carries a diagnosis of leukocytoclastic vasculitis based on biopsy and lab results. Has had rhem labs from 2/10 that showed a positive ANA, P-ANCA, mildly elevated IgA. States that she has persistent and progressive pain in her joints. No new blistering skin lesions. Has to take percocet to go to sleep and to get out of the bed - takes about 3 doses per day. Has not been on NSAIDs. Has had normal renal function in the past with Cr. 0.7 6/10. Feels like her condition is deteriorating.  Reports pain in the knees, elbows and achilles tendons  Taking prednisone 10 mg daily. Doesn't feel like it's controlling her symptoms -- thinks it may be helping her "hurt less."  **unfortunately, Dr. Kellie Simmering has decided that he doesn't have anything more to offer Ms. Haen and will not see her again. Ms. Marzo is willing to go to [facility], [place] or any place that can see her.   Habits & Providers  Alcohol-Tobacco-Diet     Tobacco Status: current  Current Medications (verified): 1)  Glucophage 500 Mg Tabs (Metformin Hcl) .... One Daily Per Dr Kellie Simmering 2)  Prednisone 10 Mg Tabs (Prednisone) .... Take 2 Tabs Daily For 2 Weeks Then Go Back To 1 Tab Daily 3)  Caltrate 600+d 600-400 Mg-Unit  Tabs (Calcium Carbonate-Vitamin D) .... Take 1 Tablet By Mouth Two Times A Day Per Dr Kellie Simmering While On Prednsione 4)  Trental 400 Mg Cr-Tabs (Pentoxifylline) .... One By Mouth Three Times A Day Per Dr Kellie Simmering 5)  Percocet  5-325 Mg Tabs (Oxycodone-Acetaminophen) .Marland Kitchen.. 1-2 Tabs By Mouth Every 6 Hours As Needed For Pain 6)  Flexeril 10 Mg Tabs (Cyclobenzaprine Hcl) .... 1/2 Tablet As Needed 7)  Clobetasol Propionate 0.05 % Crea (Clobetasol Propionate) .... Apply To Affected Areas Two Times A Day Until Not Inflamed 8)  Meloxicam 7.5 Mg Tabs (Meloxicam) .... Take One By Mouth Daily For Inflammation 9)  Percocet 5-325 Mg Tabs (Oxycodone-Acetaminophen) .... Take 1-2 Tablets By Mouth Every 4-6 Hours As Needed For Pain; Do Not Fill Until 05/18/09  Allergies (verified): 1)  ! 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 Followed by Dr Kellie Simmering and Dr Nicholas Lose for this. On steroids and trental.  -smoking - not interested in quitting  Review of Systems       + for joint and muscle aches. No fevers. No weight loss.  Physical Exam  General:  vital signs reviewed and normal, except obese Alert, appropriate; well-dressed and well-nourished  Lungs:  work of breathing unlabored, clear to auscultation bilaterally; no wheezes, rales, or ronchi; good air movement throughout  Heart:  regular rate and rhythm, no murmurs; normal s1/s2  Msk:  -L elbow tender -bilateral knees tender to palpation; no obvious effusion; normal ROM -hands/wrists without  swelling or erythema; has FROM of digits.   Skin:  hyperpigmented, healed ulcerations spread over bilateral forearms; scarred, variably pink and hyperpigmented areas on bilateral shins from previous blistering vasculitis lesions. Hyperpigmented papular lesions on posterior thighs and anterior chest.   Impression & Recommendations:  Problem # 1:  VASCULITIS (ICD-447.6) Assessment Deteriorated I'm disappointed that Dr. Kellie Simmering is not seeing Dr. Kellie Simmering. I believe she has very real pain. I do not feel that my knowledge base is adequate to effectively care for this issue and, accordingly, we'll work on arranging  follow-up at [facility] rheum for further evaluation. Pt seems very willing to look elsewhere for help. Will check sed rate, CBC with diff and CMP today in prepartation for referral. We can fax labwork from the hospital as needed.  Orders: Sed Rate (ESR)-[facility] [phone]) CBC w/Diff-[facility] [phone]) Comp Met-[facility] ([id number]) Rheumatology Referral (Rheumatology) [facility]- Est Level  3 ([address]562)  Complete Medication List: 1)  Glucophage 500 Mg Tabs (Metformin hcl) .... One daily per dr truslow 2)  Prednisone 10 Mg Tabs (Prednisone) .... Take 2 tabs daily for 2 weeks then go back to 1 tab daily 3)  Caltrate 600+d 600-400 Mg-unit Tabs (Calcium carbonate-vitamin d) .... Take 1 tablet by mouth two times a day per dr truslow while on prednsione 4)  Trental 400 Mg Cr-tabs (Pentoxifylline) .... One by mouth three times a day per dr truslow 5)  Percocet 5-325 Mg Tabs (Oxycodone-acetaminophen) .Marland Kitchen.. 1-2 tabs by mouth every 6 hours as needed for pain 6)  Flexeril 10 Mg Tabs (Cyclobenzaprine hcl) .... 1/2 tablet as needed 7)  Clobetasol Propionate 0.05 % Crea (Clobetasol propionate) .... Apply to affected areas two times a day until not inflamed 8)  Meloxicam 7.5 Mg Tabs (Meloxicam) .... Take one by mouth daily for inflammation 9)  Percocet 5-325 Mg Tabs (Oxycodone-acetaminophen) .... Take 1-2 tablets by mouth every 4-6 hours as needed for pain; do not fill until 05/18/09  Patient Instructions: 1)  increase your prednisone to 20 mg daily. 2)  Start the meloxicam to help with pain/inflammation. 3)  We'll work on getting you an appointment at [facility]. This may take a while, so be patient with [place]. Prescriptions: PERCOCET 5-325 MG TABS (OXYCODONE-ACETAMINOPHEN) take 1-2 tablets by mouth every 4-6 hours as needed for pain; do not fill until 05/18/09  #120 x 0   Entered and Authorized by:   Myrtie Soman  MD   Signed by:   Myrtie Soman  MD on 05/15/2009   Method used:   Print then Give to Patient   RxID:   [id number] MELOXICAM  7.5 MG TABS (MELOXICAM) take one by mouth daily for inflammation  #30 x 2   Entered and Authorized by:   Myrtie Soman  MD   Signed by:   Myrtie Soman  MD on 05/15/2009   Method used:   Print then Give to Patient   RxID:   [id number] PERCOCET 5-325 MG TABS (OXYCODONE-ACETAMINOPHEN) 1-2 tabs by mouth every 6 hours as needed for pain  #10 x 0   Entered and Authorized by:   Myrtie Soman  MD   Signed by:   Myrtie Soman  MD on 05/15/2009   Method used:   Print then Give to Patient   RxID:   [id number] PREDNISONE 10 MG TABS (PREDNISONE) take 2 tabs daily for 2 weeks then go back to 1 tab daily  #60 x 1   Entered and Authorized by:   Myrtie Soman  MD   Signed by:  Myrtie Soman  MD on 05/15/2009   Method used:   Print then Give to Patient   RxID:   [id number] PREDNISONE 10 MG TABS (PREDNISONE) take 2 tabs daily for 2 weeks then go back to 1 tab daily  #60 x 1   Entered and Authorized by:   Myrtie Soman  MD   Signed by:   Myrtie Soman  MD on 05/15/2009   Method used:   Print then Give to Patient   RxID:   [id number] MELOXICAM 7.5 MG TABS (MELOXICAM) take one by mouth daily for inflammation  #30 x 2   Entered and Authorized by:   Myrtie Soman  MD   Signed by:   Myrtie Soman  MD on 05/15/2009   Method used:   Print then Give to Patient   RxID:   [id number] PERCOCET 5-325 MG TABS (OXYCODONE-ACETAMINOPHEN) 1-2 tabs by mouth every 6 hours as needed for pain  #10 x 0   Entered and Authorized by:   Myrtie Soman  MD   Signed by:   Myrtie Soman  MD on 05/15/2009   Method used:   Print then Give to Patient   RxID:   [id number] PERCOCET 5-325 MG TABS (OXYCODONE-ACETAMINOPHEN) take 1-2 tablets by mouth every 4-6 hours as needed for pain; do not fill until 05/18/09  #120 x 0   Entered and Authorized by:   Myrtie Soman  MD   Signed by:   Myrtie Soman  MD on 05/15/2009   Method used:   Print then Give to Patient   RxID:    [id number]    Prevention & Chronic Care Immunizations   Influenza vaccine: Fluvax 3+  (03/12/2009)    Tetanus booster: Not documented    Pneumococcal vaccine: Not documented  Other Screening   Pap smear: Not documented    Mammogram: Not documented   Smoking status: current  (05/15/2009)  Lipids   Total Cholesterol: 195  (10/20/2008)   LDL: 111  (10/20/2008)   LDL Direct: Not documented   HDL: 64  (10/20/2008)   Triglycerides: 100  (10/20/2008)  Appended Document: Sedrate  28 mm/hr    Lab Visit  Laboratory Results   Blood Tests   Date/Time Received: May 15, 2009 2:48 PM  Date/Time Reported: May 15, 2009 3:52 PM   SED rate: 28 mm/hr  Comments: ...............test performed by......Marland KitchenBonnie A. [place], MLS (ASCP)cm    Orders Today:

## 2010-05-27 NOTE — Progress Notes (Signed)
Summary: Pain meds  Phone Note Call from Patient Call back at Home Phone [phone]   Reason for Call: Talk to Nurse Summary of Call: pt went to ED about 10 days ago, then was seen here this past Monday & was told to keep taking meds as prescribed but pt will run out, wants to speak with RN about this.  Initial call taken by: Knox Royalty,  May 19, 2010 2:29 PM  Follow-up for Phone Call         spoke with patient and she states at her last visit with Dr. Wallene Huh on 05/17/2010 she advised him that  at her ED visit the doctor told her to take oxycodone one tablet every 6 hours if needed.  she has been taking 4 tabs a day. Dr. Wallene Huh advised her to continue this she states. she will run out before month is up because last RX   direction for 1 tab every 12 hours. she has enough left for 4 days . will send  message to Dr. Wallene Huh to ask if he will refill since he last saw patient . Follow-up by: Theresia Lo RN,  May 19, 2010 2:52 PM  Additional Follow-up for Phone Call Additional follow up Details #1::        Refilled w/ further refills per PCP  Additional Follow-up by: Bobby Rumpf  MD,  May 21, 2010 12:07 PM

## 2010-05-27 NOTE — Progress Notes (Signed)
Summary: Letter Req  Phone Note Call from Patient Call back at [phone]   Caller: Patient Summary of Call: Pt has court tomorrow and is asking if Dr. Rexene Alberts would write her a note as to her condition so that she does not have to go to court. Initial call taken by: Clydell Hakim,  May 12, 2009 10:05 AM  Follow-up for Phone Call        Cannot write such a letter. please call pt to advise. thanks.  Follow-up by: Myrtie Soman  MD,  May 12, 2009 1:32 PM  Additional Follow-up for Phone Call Additional follow up Details #1::        Left message that we would be unable to provide letter. Additional Follow-up by: Garen Grams LPN,  May 13, 2009 10:57 AM

## 2010-05-27 NOTE — Miscellaneous (Signed)
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 Seen initially by Dr Kellie Simmering and Dr Nicholas Lose for this. On steroids and trental. [facility] has declined to see any further, now seeing Dr. Lucianne Muss at [facility] (rheum). Recently seen at [facility] with (+) anticardiolipin antibody consistent with antiphospholipid syndrome.  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 Seen initially by Dr Kellie Simmering and Dr Nicholas Lose for this. On steroids and trental. [facility] has declined to see any further, now seeing Dr. Lucianne Muss at [facility] (rheum). Recently seen at [facility] with (+) anticardiolipin antibody consistent with antiphospholipid syndrome.  -smoking - not interested in quitting

## 2010-06-02 ENCOUNTER — Ambulatory Visit (INDEPENDENT_AMBULATORY_CARE_PROVIDER_SITE_OTHER): Payer: Self-pay | Admitting: Sports Medicine

## 2010-06-02 ENCOUNTER — Encounter: Payer: Self-pay | Admitting: Sports Medicine

## 2010-06-02 VITALS — BP 118/73 | HR 99 | Temp 98.3°F | Ht 63.0 in | Wt 234.0 lb

## 2010-06-02 DIAGNOSIS — G5700 Lesion of sciatic nerve, unspecified lower limb: Secondary | ICD-10-CM

## 2010-06-02 HISTORY — DX: Lesion of sciatic nerve, unspecified lower limb: G57.00

## 2010-06-02 MED ORDER — GABAPENTIN 300 MG PO CAPS: ORAL_CAPSULE | ORAL | Status: DC

## 2010-06-02 NOTE — Assessment & Plan Note (Signed)
Symptoms and exam suggestive of piriformis syndrome on the R. Starting neurontin 300 qHS with self up-titration. Rehab exercises with piriformis stretching advised and handouts given. Emphasized importance of rehab and loosening the piriformis and superior gemellus muscles surrounding the sciatic nerve. RTC 2 weeks prn.

## 2010-06-02 NOTE — Miscellaneous (Addendum)
Summary: Pt in ED requesting narcotics       Noted this patient has a narcotic contract signed 02/19/2010.  She has had multiple refills on oxycodone including one yesterday.  It is noted that she was in the ED 05/09/10 and received narcotics for the SAME diagnosis for which we prescribe narcotics at the [facility], this is in DIRECT violation of her pain contract, item #2.        She is currently in the ED today 05/22/10 with leg pain stating she has run out of oxycodone.  If she receives a narcotic prescription in the ED this will AGAIN be in DIRECT violation of her narcotic contract.  These violations necessitate dismissal from our practice.  I would recommend her PCP and other prescribers of narcotics for her consider these violations and whether she should remain a patient of the [facility]. Rodney Langton MD  May 22, 2010 5:53 PM   Appended Document: Pt in ED requesting narcotics She did receive narcotics in the ED.  Appended Document: Pt in ED requesting narcotics Per contract if patient breaks contract we will no longer prescribe any controlled substances.   Dismissal does not occur automatically but can be considered on an individual basis.  Appended Document: Pt in ED requesting narcotics Spoke with patient. She says that she was not requesting narcotics but was in the ED because of acute leg pain. Emphasized to patient that she cannot receive narcotics any where else besides at our clinic and that she may not take more narcotic than the dose prescribed here. Recommended patient fill prescription for naproxen she received in the ED and try for the pain. Cause of leg pain unclear. X-rays negative thus far. Lupus flare? Patient had appointment with rheumatologist 01/30 while she was having leg pain and got x-rays there. Rheum did not put her on any medications/increase steroids at that time. Will get records from her visit there and try to speak with rheum.   Appended Document: Pt  in ED requesting narcotics Also asked her to make appointment in her clinic to discuss pain meds. Told her that if lupus pain, narcotics are not good long-term solution and encouraged anti-inflammatories. Patient expresses understanding. I will try to call Rheum @ [facility] after going through her last OV note with them.

## 2010-06-02 NOTE — Progress Notes (Signed)
  Subjective:    Patient ID: [name], female    DOB: 12-14-1965, 45 y.o.   MRN: [id number]  HPI 45 yo female with hx lupus, has had R LE pain for 3-4 weeks now, has undergone extensive testing, ED visits, LE dopplers neg, XR negative, she is desperate for relief.  Pain radiates from R buttock to R lateral thigh and lower leg, radiates down and is numbness/tingling.  Pain does not come from back and there are no positions that exacerbate symptoms except laying flat. Pain is worse at night and keeps her up.  No bowel/bladder problems or weakness.   Review of Systems    Neg except as in HPI Objective:   Physical Exam  Constitutional: She appears well-developed and well-nourished. No distress.  Musculoskeletal:       Hip ROM limited to 20 deg IR bilaterally, ER normal. Hip flexion/extension normal. Strength 5/5 to hips, knees, ankles. TTP with reproductions of symptoms with deep palpation in R sciatic notch. Positive laying straight leg raise on right, neg on left. Sensation decreased grossly to L5 distribution on R. DTRs 2+ thoughtout LE.          Assessment & Plan:

## 2010-06-02 NOTE — Patient Instructions (Addendum)
Piriformis syndrome/Sciatica.  See attached handout.  DO EXERCISES RELIGIOUSLY.  Come back if no better after increasing neurontin to max dose.  -Dr. Cora Daniels with Rehab   The sciatic nerve runs from the back down the leg and is responsible for sensation and control of the muscles in the back (posterior) side of the thigh, lower leg, and foot. Sciatica is a condition that is characterized by inflammation of this nerve.     SYMPTOMS  Signs of nerve damage, including numbness and/or weakness along the posterior side of the lower extremity.  Pain in the back of the thigh that may also travel down the leg.  Pain that worsens when sitting for long periods of time.  Occasionally, pain in the back or buttock.   CAUSES Inflammation of the sciatic nerve is the cause of sciatica. The inflammation is due to something irritating the nerve. Common sources of irritation include:  Sitting for long periods of time.  Direct trauma to the nerve.  Arthritis of the spine.  Herniated or ruptured disk.  Slipping of the vertebrae (spondylolithesis )  Pressure from soft tissues, such as muscles or ligament-like tissue (fascia).   RISK INCREASES WITH    Sports that place pressure or stress on the spine (football or weightlifting).  Poor strength and flexibility.  Failure to warm-up properly before activity.  Family history of low back pain or disk disorders.  Previous back injury or surgery.  Poor body mechanics, especially when lifting, or poor posture.   PREVENTIVE MEASURES    Warm up and stretch properly before activity.  Maintain physical fitness: l Strength, flexibility, and endurance.  Cardiovascular fitness.  Learn and use proper technique, especially with posture and lifting. When possible, have coach correct improper technique.  Avoid activities that place stress on the spine.   EXPECTED OUTCOME If treated properly, then sciatica usually resolves within 6 weeks.  However, occasionally surgery is necessary.     POSSIBLE COMPLICATIONS    Permanent nerve damage, including pain, numbness, tingle, or weakness.  Chronic back pain.  Risks of surgery: infection, bleeding, nerve damage, or damage to surrounding tissues.   GENERAL TREATMENT CONSIDERATIONS   Treatment initially involves resting from any activities that aggravate your symptoms. The use of ice and medication may help reduce pain and inflammation. The use of strengthening and stretching exercises may help reduce pain with activity. These exercises may be performed at home or with referral to a therapist. A therapist may recommend further treatments, such as transcutaneous electronic nerve stimulation (TENS) or ultrasound.  Your caregiver may recommend corticosteroid injections to help reduce inflammation of the sciatic nerve. If symptoms persist despite non-surgical (conservative) treatment, then surgery may be recommended.   MEDICATION:    If pain medication is necessary, then nonsteroidal anti-inflammatory medications, such as aspirin and ibuprofen, or other minor pain relievers, such as acetaminophen, are often recommended.    Do not take pain medication for 7 days before surgery.    Prescription pain relievers may be given if deemed necessary by your caregiver. Use only as directed and only as much as you need.  Ointments applied to the skin may be helpful.  Corticosteroid injections may be given by your caregiver. These injections should be reserved for the most serious cases, because they may only be given a certain number of times.   HEAT AND COLD:    Cold treatment (icing) relieves pain and reduces inflammation. Cold treatment should be applied for 10 to 15 minutes every 2 to  3 hours for inflammation and pain and immediately after any activity that aggravates your symptoms. Use ice packs or massage the area with a piece of ice (ice massage).  Heat treatment may be used prior to  performing the stretching and strengthening activities prescribed by your caregiver, physical therapist, or athletic trainer. Use a heat pack or soak the injury in warm water.   SEEK MEDICAL TREATMENT IF:  Treatment seems to offer no benefit, or the condition worsens.  Any medications produce adverse side effects.   EXERCISES    RANGE OF MOTION AND STRETCHING EXERCISES - Sciatica Most people with sciatic will find that their symptoms worsen with either excessive bending forward (flexion) or arching at the low back (extension). The exercises which will help resolve your symptoms will focus on the opposite motion. Your physician, physical therapist or athletic trainer will help you determine which exercises will be most helpful to resolve your low back pain. Do not complete any exercises without first consulting with your clinician. Discontinue any exercises which worsen your symptoms until you speak to your clinician. If you have pain, numbness or tingling which travels down into your buttocks, leg or foot, the goal of the therapy is for these symptoms to move closer to your back and eventually resolve. Occasionally, these leg symptoms will get better, but your low back pain may worsen; this is typically an indication of progress in your rehabilitation. Be certain to be very alert to any changes in your symptoms and the activities in which you participated in the 24 hours prior to the change. Sharing this information with your clinician will allow him/her to most efficiently treat your condition.   These exercises may help you when beginning to rehabilitate your injury. Your symptoms may resolve with or without further involvement from your physician, physical therapist or athletic trainer. While completing these exercises, remember:    Restoring tissue flexibility helps normal motion to return to the joints. This allows healthier, less painful movement and activity.  An effective stretch should be  held for at least 30 seconds.  A stretch should never be painful. You should only feel a gentle lengthening or release in the stretched tissue.       FLEXION RANGE OF MOTION AND STRETCHING EXERCISES:   STRETCH - Flexion, Single Knee to Chest   Lie on a firm bed or floor with both legs extended in front of you.  Keeping one leg in contact with the floor, bring your opposite knee to your chest. Hold your leg in place by either grabbing behind your thigh or at your knee.  Pull until you feel a gentle stretch in your low back. Hold __________ seconds.  Slowly release your grasp and repeat the exercise with the opposite side. Repeat __________ times. Complete this exercise __________ times per day.       STRETCH - Flexion, Double Knee to Chest    Lie on a firm bed or floor with both legs extended in front of you.  Keeping one leg in contact with the floor, bring your opposite knee to your chest.    Tense your stomach muscles to support your back and then lift your other knee to your chest. Hold your legs in place by either grabbing behind your thighs or at your knees.  Pull both knees toward your chest until you feel a gentle stretch in your low back. Hold __________ seconds.  Tense your stomach muscles and slowly return one leg at a time to  the floor. Repeat __________ times. Complete this exercise __________ times per day.       STRETCH - Low Trunk Rotation   Lie on a firm bed or floor. Keeping your legs in front of you, bend your knees so they are both pointed toward the ceiling and your feet are flat on the floor.  Extend your arms out to the side. This will stabilize your upper body by keeping your shoulders in contact with the floor.  Gently and slowly drop both knees together to one side until you feel a gentle stretch in your low back. Hold for __________ seconds.    Tense your stomach muscles to support your low back as you bring your knees back to the starting position.   Repeat the exercise to the other side. Repeat __________ times. Complete this exercise __________ times per day       EXTENSION RANGE OF MOTION AND FLEXIBILITY EXERCISES:   STRETCH - Extension, Prone on Elbows    Lie on your stomach on the floor, a bed will be too soft. Place your palms about shoulder width apart and at the height of your head.  Place your elbows under your shoulders. If this is too painful, stack pillows under your chest.  Allow your body to relax so that your hips drop lower and make contact more completely with the floor.  Hold this position for __________ seconds.  Slowly return to lying flat on the floor. Repeat __________ times. Complete this exercise __________ times per day.       RANGE OF MOTION - Extension, Prone Press Ups    Lie on your stomach on the floor, a bed will be too soft. Place your palms about shoulder width apart and at the height of your head.  Keeping your back as relaxed as possible, slowly straighten your elbows while keeping your hips on the floor. You may adjust the placement of your hands to maximize your comfort. As you gain motion, your hands will come more underneath your shoulders.  Hold this position __________ seconds.  Slowly return to lying flat on the floor. Repeat __________ times. Complete this exercise __________ times per day.       STRENGTHENING EXERCISES - Sciatica  These exercises may help you when beginning to rehabilitate your injury. These exercises should be done near your "sweet spot." This is the neutral, low-back arch, somewhere between fully rounded and fully arched, that is your least painful position. When performed in this safe range of motion, these exercises can be used for people who have either a flexion or extension based injury. These exercises may resolve your symptoms with or without further involvement from your physician, physical therapist or athletic trainer. While completing these exercises,  remember:    Muscles can gain both the endurance and the strength needed for everyday activities through controlled exercises.  Complete these exercises as instructed by your physician, physical therapist or athletic trainer. Progress with the resistance and repetition exercises only as your caregiver advises.  You may experience muscle soreness or fatigue, but the pain or discomfort you are trying to eliminate should never worsen during these exercises.  If this pain does worsen, stop and make certain you are following the directions exactly. If the pain is still present after adjustments, discontinue the exercise until you can discuss the trouble with your clinician.    STRENGTHENING - Deep Abdominals, Pelvic Tilt   Lie on a firm bed or floor. Keeping your legs in front  of you, bend your knees so they are both pointed toward the ceiling and your feet are flat on the floor.  Tense your lower abdominal muscles to press your low back into the floor. This motion will rotate your pelvis so that your tail bone is scooping upwards rather than pointing at your feet or into the floor.  With a gentle tension and even breathing, hold this position for __________ seconds. Repeat __________ times. Complete this exercise __________ times per day.       STRENGTHENING - Abdominals, Crunches   Lie on a firm bed or floor. Keeping your legs in front of you, bend your knees so they are both pointed toward the ceiling and your feet are flat on the floor. Cross your arms over your chest.    Slightly tip your chin down without bending your neck.  Tense your abdominals and slowly lift your trunk high enough to just clear your shoulder blades. Lifting higher can put excessive stress on the low back and does not further strengthen your abdominal muscles.  Control your return to the starting position. Repeat __________ times. Complete this exercise __________ times per day.       STRENGTHENING - Quadruped,  Opposite UE/LE Lift    Assume a hands and knees position on a firm surface. Keep your hands under your shoulders and your knees under your hips. You may place padding under your knees for comfort.    Find your neutral spine and gently tense your abdominal muscles so that you can maintain this position. Your shoulders and hips should form a rectangle that is parallel with the floor and is not twisted.    Keeping your trunk steady, lift your right hand no higher than your shoulder and then your left leg no higher than your hip. Make sure you are not holding your breath. Hold this position __________ seconds.  Continuing to keep your abdominal muscles tense and your back steady, slowly return to your starting position. Repeat with the opposite arm and leg. Repeat __________ times. Complete this exercise __________ times per day.       STRENGTHENING - Abdominals and Quadriceps, Straight Leg Raise   Lie on a firm bed or floor with both legs extended in front of you.  Keeping one leg in contact with the floor, bend the other knee so that your foot can rest flat on the floor.  Find your neutral spine, and tense your abdominal muscles to maintain your spinal position throughout the exercise.  Slowly lift your straight leg off the floor about 6 inches for a count of 15, making sure to not hold your breath.  Still keeping your neutral spine, slowly lower your leg all the way to the floor.   Repeat this exercise with each leg __________ times. Complete this exercise __________ times per day.     POSTURE AND BODY MECHANICS CONSIDERATIONS - Sciatica Keeping correct posture when sitting, standing or completing your activities will reduce the stress put on different body tissues, allowing injured tissues a chance to heal and limiting painful experiences. The following are general guidelines for improved posture. Your physician or physical therapist will provide you with any instructions specific to your  needs. While reading these guidelines, remember:  The exercises prescribed by your provider will help you have the flexibility and strength to maintain correct postures.  The correct posture provides the optimal environment for your joints to work. All of your joints have less wear and tear when properly  supported by a spine with good posture. This means you will experience a healthier, less painful body.  Correct posture must be practiced with all of your activities, especially prolonged sitting and standing.  Correct posture is as important when doing repetitive low-stress activities (typing) as it is when doing a single heavy-load activity (lifting).       RESTING POSITIONS Consider which positions are most painful for you when choosing a resting position. If you have pain with flexion-based activities (sitting, bending, stooping, squatting), choose a position that allows you to rest in a less flexed posture. You would want to avoid curling into a fetal position on your side. If your pain worsens with extension-based activities (prolonged standing, working overhead), avoid resting in an extended position such as sleeping on your stomach. Most people will find more comfort when they rest with their spine in a more neutral position, neither too rounded nor too arched. Lying on a non-sagging bed on your side with a pillow between your knees, or on your back with a pillow under your knees will often provide some relief. Keep in mind, being in any one position for a prolonged period of time, no matter how correct your posture, can still lead to stiffness.  PROPER SITTING POSTURE In order to minimize stress and discomfort on your spine, you must sit with correct posture Sitting with good posture should be effortless for a healthy body.  Returning to good posture is a gradual process. Many people can work toward this most comfortably by using various supports until they have the flexibility and strength to  maintain this posture on their own.   When sitting with proper posture, your ears will fall over your shoulders and your shoulders will fall over your hips. You should use the back of the chair to support your upper back. Your low back will be in a neutral position, just slightly arched. You may place a small pillow or folded towel at the base of your low back for support.     When working at a desk, create an environment that supports good, upright posture. Without extra support, muscles fatigue and lead to excessive strain on joints and other tissues.  Keep these recommendations in mind:  CHAIR:    A chair should be able to slide under your desk when your back makes contact with the back of the chair. This allows you to work closely.  The chair's height should allow your eyes to be level with the upper part of your monitor and your hands to be slightly lower than your elbows.  BODY POSITION  Your feet should make contact with the floor. If this is not possible, use a foot rest.  Keep your ears over your shoulders. This will reduce stress on your neck and low back.     INCORRECT SITTING POSTURES   If you are feeling tired and unable to assume a healthy sitting posture, do not slouch or slump. This puts excessive strain on your back tissues, causing more damage and pain. Healthier options include:  Using more support, like a lumbar pillow.  Switching tasks to something that requires you to be upright or walking.  Talking a brief walk.  Lying down to rest in a neutral-spine position.  PROLONGED STANDING WHILE SLIGHTLY LEANING FORWARD  When completing a task that requires you to lean forward while standing in one place for a long time, place either foot up on a stationary 2-4 inch high object to help maintain  the best posture. When both feet are on the ground, the low back tends to lose its slight inward curve.  If this curve flattens (or becomes too large), then the back and your other  joints will experience too much stress, fatigue more quickly and can cause pain.    CORRECT STANDING POSTURES Proper standing posture should be assumed with all daily activities, even if they only take a few moments, like when brushing your teeth. As in sitting, your ears should fall over your shoulders and your shoulders should fall over your hips. You should keep a slight tension in your abdominal muscles to brace your spine. Your tailbone should point down to the ground, not behind your body, resulting in an over-extended swayback posture.     INCORRECT STANDING POSTURES  Common incorrect standing postures include a forward head, locked knees and/or an excessive swayback.  WALKING Walk with an upright posture. Your ears, shoulders and hips should all line-up.  PROLONGED ACTIVITY IN A FLEXED POSITION When completing a task that requires you to bend forward at your waist or lean over a low surface, try to find a way to stabilize 3 of 4 of your limbs. You can place a hand or elbow on your thigh or rest a knee on the surface you are reaching across. This will provide you more stability so that your muscles do not fatigue as quickly. By keeping your knees relaxed, or slightly bent, you will also reduce stress across your low back.    CORRECT LIFTING TECHNIQUES DO :   Assume a wide stance. This will provide you more stability and the opportunity to get as close as possible to the object which you are lifting.  Tense your abdominals to brace your spine; then bend at the knees and hips. Keeping your back locked in a neutral-spine position, lift using your leg muscles. Lift with your legs, keeping your back straight.  Test the weight of unknown objects before attempting to lift them.  Try to keep your elbows locked down at your sides in order get the best strength from your shoulders when carrying an object.  Always ask for help when lifting heavy or awkward objects.  INCORRECT LIFTING  TECHNIQUES DO NOT:   Lock your knees when lifting, even if it is a small object.  Bend and twist. Pivot at your feet or move your feet when needing to change directions.  Assume that you cannot safely pick up a paperclip without proper posture.    Document Released: 04/11/2005  Document Re-Released: 05/03/2009 [facility] Patient Information 2011 [place], [place].

## 2010-06-10 ENCOUNTER — Telehealth: Payer: Self-pay | Admitting: Family Medicine

## 2010-06-10 MED ORDER — TRAMADOL HCL 50 MG PO TABS: 50.0000 mg | ORAL_TABLET | Freq: Two times a day (BID) | ORAL | Status: DC

## 2010-06-10 MED ORDER — OMEPRAZOLE 40 MG PO CPDR: 40.0000 mg | DELAYED_RELEASE_CAPSULE | Freq: Every day | ORAL | Status: AC

## 2010-06-10 MED ORDER — NAPROXEN 500 MG PO TABS: 500.0000 mg | ORAL_TABLET | Freq: Two times a day (BID) | ORAL | Status: DC

## 2010-06-10 NOTE — Telephone Encounter (Signed)
Patient was seen here on Feb 8th by Dr. Benjamin Stain for piriformis syndrome.  The narcotics were not even addressed at that visit.  I am not authorizing refills, will need another appointment to address. [name] O

## 2010-06-10 NOTE — Telephone Encounter (Signed)
Will forward message to Dr.Breen , preceptor today.

## 2010-06-10 NOTE — Telephone Encounter (Signed)
Patient presenting with same arthritic pain though to be 2/2 lupus.  Will D/C oxycodone since patient has been on this medication for a while, does not control pain that well, and is not a good treatment for arthritis 2/2 lupus.  Patient has only been taking naproxen prn. Encouraged her to take bid. Discussed how anti-inflammatory is a better medicine for source of pain vs narcotic. Patient was amenable and cooperative. Will send refill for naproxen c Rx for PPI in case of reflux. Warned of possible gastritis and UGIB and kidney problems c med. Will check RFTs in May @ f/u. Also recommended Tylenol 650mg  q6hrs prn for breakthrough pain.  Patient also taking Tramadol bid. Will print new Rx for Tramadol and asked patient to pick up some time next week.  Also on Plaquenil 200 bid. Next Rheum appt is in June.  Next clinic appt c Dr. Lorenda Peck for sciatica 07/26/2010. Asked pt to make f/u appt c me in May to discuss pain.  Will try to get in touch c Rheum to discuss other options for pain control. Methotrexate? Increasing dose of Plaquenil? Will discuss c Rheum.

## 2010-06-10 NOTE — Consult Note (Signed)
Summary: [facility] - Rheumatology  [facility] - Rheumatology   Imported By: De Nurse 05/28/2010 12:36:09  _____________________________________________________________________  External Attachment:    Type:   Image     Comment:   External Document

## 2010-06-10 NOTE — Telephone Encounter (Signed)
Spoke with patient and she states she spoke with Dr. Madolyn Frieze on the day before she came to see Dr. Benjamin Stain for sciatica and she thought Dr. Madolyn Frieze was going to refill  the Percocet that day. She did not dicuss with Dr. Benjamin Stain because she takes percocet for lupus pain and she thought it was going to be sent in. Will contact Dr. Madolyn Frieze to ask about refilling.  spoke with Dr. Madolyn Frieze and she will address this issue.

## 2010-06-11 ENCOUNTER — Telehealth: Payer: Self-pay | Admitting: Family Medicine

## 2010-06-11 DIAGNOSIS — R7309 Other abnormal glucose: Secondary | ICD-10-CM

## 2010-06-11 DIAGNOSIS — L930 Discoid lupus erythematosus: Secondary | ICD-10-CM

## 2010-06-11 DIAGNOSIS — M129 Arthropathy, unspecified: Secondary | ICD-10-CM

## 2010-06-11 NOTE — Telephone Encounter (Signed)
Spoke c Dr. Katrinka Blazing. When he saw her recently, no joint swelling or erythema. Some tenderness. Did not think pt had active lupus arthritis. Patient is on a good regimen for lupus control c plaquenil and prednisone. May 2011 x-rays of hands and feet showed no arthritis but some degenerative changes.  Chronic pain may be due to degenerative changes/deconditioning as opposed to lupus arthritis.  Consider voltaren gel prn, found help some other patients. Since patient already on oral NSAIDs (naproxen).  Exercise is also very good treatment in case of myalgias.  Patient already on maximum optimal dose of Plaquenil @ 200 bid.   A/P:  So will continue to hold narcotics.  Continue prednisone, plaquenil, naproxen, & tramadol. Be wary of gastritis on prednisone and naproxen. Patient on omeprazole.  Will encourage exercise & smoking cessation.  Will ask [facility] to send over x-rays. Will consider voltaren gel prn.

## 2010-06-11 NOTE — Telephone Encounter (Signed)
Spoke with the film (xray) people at [facility] we will have to fax a ROI before they can send [place] any films, although we are the ones who referred her.  Spoke with patient and she will not be able to get by here until Wednesday of next week to sign.  Will place up front with Bradly Bienenstock for patient to sign.  Will fax after that.

## 2010-07-09 LAB — COMPREHENSIVE METABOLIC PANEL
BUN: 6 mg/dL (ref 6–23)
Calcium: 8.4 mg/dL (ref 8.4–10.5)
Glucose, Bld: 171 mg/dL — ABNORMAL HIGH (ref 70–99)
Sodium: 135 mEq/L (ref 135–145)
Total Protein: 6.6 g/dL (ref 6.0–8.3)

## 2010-07-09 LAB — CBC
HCT: 26.4 % — ABNORMAL LOW (ref 36.0–46.0)
HCT: 29.4 % — ABNORMAL LOW (ref 36.0–46.0)
Hemoglobin: 7.3 g/dL — ABNORMAL LOW (ref 12.0–15.0)
Hemoglobin: 7.7 g/dL — ABNORMAL LOW (ref 12.0–15.0)
Hemoglobin: 7.9 g/dL — ABNORMAL LOW (ref 12.0–15.0)
MCH: 17.6 pg — ABNORMAL LOW (ref 26.0–34.0)
MCV: 65.5 fL — ABNORMAL LOW (ref 78.0–100.0)
Platelets: 378 10*3/uL (ref 150–400)
RBC: 4.07 MIL/uL (ref 3.87–5.11)
RBC: 4.38 MIL/uL (ref 3.87–5.11)
RBC: 4.44 MIL/uL (ref 3.87–5.11)
RDW: 18 % — ABNORMAL HIGH (ref 11.5–15.5)
WBC: 11.5 10*3/uL — ABNORMAL HIGH (ref 4.0–10.5)
WBC: 13.3 10*3/uL — ABNORMAL HIGH (ref 4.0–10.5)

## 2010-07-09 LAB — DIFFERENTIAL
Eosinophils Relative: 1 % (ref 0–5)
Eosinophils Relative: 1 % (ref 0–5)
Lymphocytes Relative: 12 % (ref 12–46)
Lymphocytes Relative: 14 % (ref 12–46)
Lymphs Abs: 1.5 10*3/uL (ref 0.7–4.0)
Monocytes Absolute: 0.7 10*3/uL (ref 0.1–1.0)
Monocytes Relative: 10 % (ref 3–12)
Monocytes Relative: 6 % (ref 3–12)
Neutrophils Relative %: 79 % — ABNORMAL HIGH (ref 43–77)

## 2010-07-09 LAB — CARDIAC PANEL(CRET KIN+CKTOT+MB+TROPI)
CK, MB: 0.6 ng/mL (ref 0.3–4.0)
CK, MB: 0.7 ng/mL (ref 0.3–4.0)
Relative Index: INVALID (ref 0.0–2.5)
Relative Index: INVALID (ref 0.0–2.5)
Total CK: 25 U/L (ref 7–177)
Total CK: 29 U/L (ref 7–177)
Total CK: 30 U/L (ref 7–177)
Troponin I: 0.01 ng/mL (ref 0.00–0.06)

## 2010-07-09 LAB — RETICULOCYTES
RBC.: 4.34 MIL/uL (ref 3.87–5.11)
Retic Count, Absolute: 56.4 10*3/uL (ref 19.0–186.0)
Retic Ct Pct: 1.3 % (ref 0.4–3.1)

## 2010-07-09 LAB — GLUCOSE, CAPILLARY
Glucose-Capillary: 136 mg/dL — ABNORMAL HIGH (ref 70–99)
Glucose-Capillary: 145 mg/dL — ABNORMAL HIGH (ref 70–99)
Glucose-Capillary: 154 mg/dL — ABNORMAL HIGH (ref 70–99)
Glucose-Capillary: 170 mg/dL — ABNORMAL HIGH (ref 70–99)
Glucose-Capillary: 177 mg/dL — ABNORMAL HIGH (ref 70–99)
Glucose-Capillary: 240 mg/dL — ABNORMAL HIGH (ref 70–99)
Glucose-Capillary: 272 mg/dL — ABNORMAL HIGH (ref 70–99)

## 2010-07-09 LAB — IRON AND TIBC: TIBC: 317 ug/dL (ref 250–470)

## 2010-07-09 LAB — PROTIME-INR
INR: 1.21 (ref 0.00–1.49)
Prothrombin Time: 15.5 seconds — ABNORMAL HIGH (ref 11.6–15.2)

## 2010-07-09 LAB — C-REACTIVE PROTEIN
CRP: 15.1 mg/dL — ABNORMAL HIGH (ref <0.6)
CRP: 6.2 mg/dL — ABNORMAL HIGH (ref <0.6)

## 2010-07-09 LAB — CULTURE, BLOOD (ROUTINE X 2): Culture: NO GROWTH

## 2010-07-09 LAB — BASIC METABOLIC PANEL
CO2: 28 mEq/L (ref 19–32)
Calcium: 8.2 mg/dL — ABNORMAL LOW (ref 8.4–10.5)
Calcium: 8.7 mg/dL (ref 8.4–10.5)
Chloride: 104 mEq/L (ref 96–112)
Creatinine, Ser: 0.77 mg/dL (ref 0.4–1.2)
GFR calc Af Amer: >60 mL/min (ref >60)
GFR calc non Af Amer: >60 mL/min (ref >60)
Glucose, Bld: 138 mg/dL — ABNORMAL HIGH (ref 70–99)
Glucose, Bld: 140 mg/dL — ABNORMAL HIGH (ref 70–99)
Potassium: 4 mEq/L (ref 3.5–5.1)
Sodium: 141 mEq/L (ref 135–145)

## 2010-07-09 LAB — ANTIPHOSPHOLIPID SYNDROME EVAL, BLD
Anticardiolipin IgA: 6 APL U/mL — ABNORMAL LOW (ref <22)
Anticardiolipin IgM: 5 MPL U/mL — ABNORMAL LOW (ref <11)
DRVVT: 65.4 secs — ABNORMAL HIGH (ref 36.2–44.3)
Phosphatydalserine, IgG: 11 U/mL — ABNORMAL HIGH (ref <10)

## 2010-07-09 LAB — POCT CARDIAC MARKERS
CKMB, poc: <1 ng/mL — ABNORMAL LOW (ref 1.0–8.0)
Myoglobin, poc: 33.4 ng/mL (ref 12–200)

## 2010-07-09 LAB — SEDIMENTATION RATE: Sed Rate: 39 mm/hr — ABNORMAL HIGH (ref 0–22)

## 2010-07-09 LAB — CK TOTAL AND CKMB (NOT AT ARMC): CK, MB: 0.6 ng/mL (ref 0.3–4.0)

## 2010-07-09 LAB — TSH: TSH: 0.827 u[IU]/mL (ref 0.350–4.500)

## 2010-07-09 LAB — TROPONIN I: Troponin I: 0.02 ng/mL (ref 0.00–0.06)

## 2010-07-09 LAB — APTT: aPTT: 31 seconds (ref 24–37)

## 2010-07-16 ENCOUNTER — Other Ambulatory Visit: Payer: Self-pay | Admitting: Family Medicine

## 2010-07-16 DIAGNOSIS — M129 Arthropathy, unspecified: Secondary | ICD-10-CM

## 2010-07-16 MED ORDER — TRAMADOL HCL 50 MG PO TABS: 50.0000 mg | ORAL_TABLET | Freq: Two times a day (BID) | ORAL | Status: DC

## 2010-07-16 NOTE — Assessment & Plan Note (Signed)
Will send in refill Tramadol #60, RF 3

## 2010-07-26 ENCOUNTER — Encounter: Payer: Self-pay | Attending: Physical Medicine and Rehabilitation | Admitting: Physical Medicine and Rehabilitation

## 2010-08-05 LAB — URINALYSIS, ROUTINE W REFLEX MICROSCOPIC
Glucose, UA: 500 mg/dL — AB
Ketones, ur: 15 mg/dL — AB
Specific Gravity, Urine: 1.03 (ref 1.005–1.030)
pH: 5.5 (ref 5.0–8.0)

## 2010-08-05 LAB — CBC
HCT: 30.1 % — ABNORMAL LOW (ref 36.0–46.0)
HCT: 30.6 % — ABNORMAL LOW (ref 36.0–46.0)
HCT: 31.4 % — ABNORMAL LOW (ref 36.0–46.0)
Hemoglobin: 10.4 g/dL — ABNORMAL LOW (ref 12.0–15.0)
MCV: 70.4 fL — ABNORMAL LOW (ref 78.0–100.0)
MCV: 72.6 fL — ABNORMAL LOW (ref 78.0–100.0)
Platelets: 219 10*3/uL (ref 150–400)
Platelets: 311 10*3/uL (ref 150–400)
RBC: 4.46 MIL/uL (ref 3.87–5.11)
RDW: 19 % — ABNORMAL HIGH (ref 11.5–15.5)
RDW: 19.5 % — ABNORMAL HIGH (ref 11.5–15.5)
WBC: 4.6 10*3/uL (ref 4.0–10.5)
WBC: 6.5 10*3/uL (ref 4.0–10.5)
WBC: 7.4 10*3/uL (ref 4.0–10.5)

## 2010-08-05 LAB — GLUCOSE, CAPILLARY
Glucose-Capillary: 146 mg/dL — ABNORMAL HIGH (ref 70–99)
Glucose-Capillary: 190 mg/dL — ABNORMAL HIGH (ref 70–99)
Glucose-Capillary: 251 mg/dL — ABNORMAL HIGH (ref 70–99)
Glucose-Capillary: 276 mg/dL — ABNORMAL HIGH (ref 70–99)
Glucose-Capillary: 285 mg/dL — ABNORMAL HIGH (ref 70–99)
Glucose-Capillary: 297 mg/dL — ABNORMAL HIGH (ref 70–99)
Glucose-Capillary: 298 mg/dL — ABNORMAL HIGH (ref 70–99)
Glucose-Capillary: 299 mg/dL — ABNORMAL HIGH (ref 70–99)
Glucose-Capillary: 318 mg/dL — ABNORMAL HIGH (ref 70–99)

## 2010-08-05 LAB — URINE MICROSCOPIC-ADD ON

## 2010-08-05 LAB — BASIC METABOLIC PANEL
BUN: 11 mg/dL (ref 6–23)
BUN: 6 mg/dL (ref 6–23)
Calcium: 8.1 mg/dL — ABNORMAL LOW (ref 8.4–10.5)
Chloride: 101 mEq/L (ref 96–112)
Chloride: 102 mEq/L (ref 96–112)
Creatinine, Ser: 0.71 mg/dL (ref 0.4–1.2)
GFR calc non Af Amer: >60 mL/min (ref >60)
GFR calc non Af Amer: >60 mL/min (ref >60)
Glucose, Bld: 232 mg/dL — ABNORMAL HIGH (ref 70–99)
Glucose, Bld: 305 mg/dL — ABNORMAL HIGH (ref 70–99)
Potassium: 3.8 mEq/L (ref 3.5–5.1)
Potassium: 3.8 mEq/L (ref 3.5–5.1)
Potassium: 4.5 mEq/L (ref 3.5–5.1)
Sodium: 134 mEq/L — ABNORMAL LOW (ref 135–145)
Sodium: 136 mEq/L (ref 135–145)

## 2010-08-05 LAB — C4 COMPLEMENT: Complement C4, Body Fluid: 32 mg/dL (ref 16–47)

## 2010-08-05 LAB — ANTI-NUCLEAR AB-TITER (ANA TITER)

## 2010-08-05 LAB — C3 COMPLEMENT: C3 Complement: 174 mg/dL (ref 88–201)

## 2010-08-10 LAB — CULTURE, BLOOD (ROUTINE X 2): Culture: NO GROWTH

## 2010-08-10 LAB — COMPREHENSIVE METABOLIC PANEL
ALT: 12 U/L (ref 0–35)
AST: 20 U/L (ref 0–37)
AST: 20 U/L (ref 0–37)
Albumin: 2.5 g/dL — ABNORMAL LOW (ref 3.5–5.2)
Albumin: 2.7 g/dL — ABNORMAL LOW (ref 3.5–5.2)
BUN: 9 mg/dL (ref 6–23)
CO2: 21 mEq/L (ref 19–32)
CO2: 24 mEq/L (ref 19–32)
Calcium: 8 mg/dL — ABNORMAL LOW (ref 8.4–10.5)
Calcium: 8.3 mg/dL — ABNORMAL LOW (ref 8.4–10.5)
Calcium: 8.4 mg/dL (ref 8.4–10.5)
Chloride: 104 mEq/L (ref 96–112)
Chloride: 106 mEq/L (ref 96–112)
Chloride: 108 mEq/L (ref 96–112)
Creatinine, Ser: 0.62 mg/dL (ref 0.4–1.2)
Creatinine, Ser: 0.69 mg/dL (ref 0.4–1.2)
Creatinine, Ser: 0.72 mg/dL (ref 0.4–1.2)
GFR calc Af Amer: >60 mL/min (ref >60)
GFR calc Af Amer: >60 mL/min (ref >60)
GFR calc non Af Amer: >60 mL/min (ref >60)
GFR calc non Af Amer: >60 mL/min (ref >60)
Sodium: 136 mEq/L (ref 135–145)
Total Bilirubin: 0.3 mg/dL (ref 0.3–1.2)
Total Bilirubin: 0.5 mg/dL (ref 0.3–1.2)

## 2010-08-10 LAB — BASIC METABOLIC PANEL
BUN: 4 mg/dL — ABNORMAL LOW (ref 6–23)
CO2: 23 mEq/L (ref 19–32)
Chloride: 104 mEq/L (ref 96–112)
GFR calc non Af Amer: >60 mL/min (ref >60)
Glucose, Bld: 170 mg/dL — ABNORMAL HIGH (ref 70–99)
Potassium: 3.7 mEq/L (ref 3.5–5.1)
Sodium: 133 mEq/L — ABNORMAL LOW (ref 135–145)

## 2010-08-10 LAB — URINALYSIS, ROUTINE W REFLEX MICROSCOPIC
Bilirubin Urine: NEGATIVE
Ketones, ur: NEGATIVE mg/dL
Leukocytes, UA: NEGATIVE
Nitrite: NEGATIVE
Specific Gravity, Urine: 1.028 (ref 1.005–1.030)
Urobilinogen, UA: 1 mg/dL (ref 0.0–1.0)
pH: 6 (ref 5.0–8.0)

## 2010-08-10 LAB — DIFFERENTIAL
Basophils Absolute: 0 10*3/uL (ref 0.0–0.1)
Lymphocytes Relative: 20 % (ref 12–46)
Neutro Abs: 3.2 10*3/uL (ref 1.7–7.7)
Neutrophils Relative %: 73 % (ref 43–77)

## 2010-08-10 LAB — CBC
HCT: 31.7 % — ABNORMAL LOW (ref 36.0–46.0)
HCT: 36.5 % (ref 36.0–46.0)
Hemoglobin: 10.5 g/dL — ABNORMAL LOW (ref 12.0–15.0)
MCHC: 33.1 g/dL (ref 30.0–36.0)
MCHC: 33.2 g/dL (ref 30.0–36.0)
MCHC: 33.2 g/dL (ref 30.0–36.0)
MCHC: 33.4 g/dL (ref 30.0–36.0)
MCV: 69.9 fL — ABNORMAL LOW (ref 78.0–100.0)
MCV: 70 fL — ABNORMAL LOW (ref 78.0–100.0)
MCV: 70.7 fL — ABNORMAL LOW (ref 78.0–100.0)
Platelets: 197 10*3/uL (ref 150–400)
Platelets: 207 10*3/uL (ref 150–400)
RBC: 4.82 MIL/uL (ref 3.87–5.11)
RBC: 5.01 MIL/uL (ref 3.87–5.11)
RBC: 5.21 MIL/uL — ABNORMAL HIGH (ref 3.87–5.11)
RDW: 18.9 % — ABNORMAL HIGH (ref 11.5–15.5)
WBC: 4.4 10*3/uL (ref 4.0–10.5)
WBC: 4.7 10*3/uL (ref 4.0–10.5)

## 2010-08-10 LAB — SEDIMENTATION RATE: Sed Rate: 19 mm/hr (ref 0–22)

## 2010-08-10 LAB — STREP A DNA PROBE

## 2010-08-10 LAB — HEPATITIS B SURFACE ANTIGEN: Hepatitis B Surface Ag: NEGATIVE

## 2010-08-10 LAB — HEMOGLOBIN A1C: Hgb A1c MFr Bld: 6.5 % — ABNORMAL HIGH (ref 4.6–6.1)

## 2010-08-10 LAB — URINE MICROSCOPIC-ADD ON

## 2010-08-10 LAB — MPO/PR-3 (ANCA) ANTIBODIES

## 2010-08-10 LAB — IGA: IgA: 381 mg/dL — ABNORMAL HIGH (ref 68–378)

## 2010-08-10 LAB — APTT: aPTT: 41 seconds — ABNORMAL HIGH (ref 24–37)

## 2010-08-10 LAB — PROTIME-INR
INR: 1.1 (ref 0.00–1.49)
Prothrombin Time: 15 seconds (ref 11.6–15.2)

## 2010-08-10 LAB — ANTI-NEUTROPHIL ANTIBODY

## 2010-08-10 LAB — CK: Total CK: 25 U/L (ref 7–177)

## 2010-09-07 NOTE — Group Therapy Note (Signed)
NAME:  [name], [name] NO.:  [id number]   MEDICAL RECORD NO.:  [id number]          PATIENT TYPE:  WOC   LOCATION:  [facility]                   FACILITY:  [facility]   PHYSICIAN:  Tinnie Gens, MD        DATE OF BIRTH:  03/01/1966   DATE OF SERVICE:  09/27/2007                                  CLINIC NOTE   CHIEF COMPLAINT:  Abnormal uterine bleeding and yearly exam.   HISTORY OF PRESENT ILLNESS:  The patient is a 45 year old nulligravida  patient who has a remote history of cocaine use.  She was incarcerated  for some time and gained a lot of weight, and since that time, she has  had very irregular periods.  She will go 3-4 months without a period and  then have bleeding for approximately 30 days followed by more bleeding.  This has been quite irregular and heavy.  She has a lot of pain with her  period, which she has never had in the past, and she has been in several  times for this.  The last time was in January 2008.  She maybe going to  have hydrothermal ablation versus NovaSure at that time; however, she  somehow lost the followup over the last year.  She is more than 3 years  since the last ultrasound, more than 2 years since her last Pap smear  and mammogram.  She has not had endometrial sampling yet, and her last  fibroid showed fibroid uterus.   PAST MEDICAL HISTORY:  Negative.   PAST SURGICAL HISTORY:  Appendectomy in 1993.   MEDICATIONS:  Aspirin as needed.  Pamprin as needed.   ALLERGIES:  PENICILLIN and CODEINE.   OBSTETRICAL HISTORY:  She is a G0.   GYNECOLOGIC HISTORY:  Menarche at age 33.  Cycles as noted in the HPI.  Last Pap June 2007.  She does have a history of abnormal Pap in 2004.   FAMILY HISTORY:  Significant for hypertension.   SOCIAL HISTORY:  She is a smoker, 1 pack per day for the past 28 years.  No alcohol use.  Chronic cocaine use.  She has been cleaned for 27  months now.   REVIEW OF SYSTEMS:  Positive for painful cycles and  heavy vaginal  bleeding.  She denies significant dizziness, headaches, vision changes,  fevers, chills, nausea, vomiting, diarrhea, constipation, or change in  her breasts.   PHYSICAL EXAMINATION:  VITAL SIGNS:  Her vitals were as noted above.  Her blood pressure is 139/86, weight is 241, pulse 84, temperature 99.1,  and respiratory rate 20.  GENERAL:  She is an obese female in no acute distress.  HEENT:  Normocephalic and atraumatic.  Sclerae anicteric.  ABDOMEN:  Soft, nontender, and nondistended.  It is obese.  GU:  Normal external female genitalia.  BUS normal.  Vagina is pink and  rugated.  Cervix is nulliparous without lesions.  Uterus is anteverted.  No adnexal tenderness is noted.  Masses or uterine cells cannot really  be elucidated secondary to body habitus.   PROCEDURE:  Cervix was cleaned with Betadine x1.  It was grasped  anteriorly with single-tooth tenaculum, sounded to 9.5 cm.  Then,  endometrial sampling was taken without difficulty.   IMPRESSION:  1. Gynecologic exam with Pap.  2. Menometrorrhagia.  3. Obesity.  4. Tobacco use.   PLAN:  1. Pap smear today.  Schedule mammogram.  2. Endometrial biopsy today.  3. Pelvic sonogram.  4. Follow up results in a couple weeks where discussion of options      will be held With the patient.  Must rule out significant uterine      pathology prior to scheduling anything.  The patient is interested      possibly in hydrothermal ablation versus NovaSure versus      hysterectomy, but we will await her results before final decision      is made about this.           ______________________________  Tinnie Gens, MD     TP/MEDQ  D:  09/27/2007  T:  09/28/2007  Job:  [id number]

## 2010-09-07 NOTE — Assessment & Plan Note (Signed)
[facility]   NAME:  [name], [name]                 ACCOUNT NO.:  [id number]   MEDICAL RECORD NO.:  [id number]      DATE OF BIRTH:  1965-09-26   PHYSICIAN:  Lenon Curt. Chilton Si, M.D.   VISIT DATE:  08/15/2008                                   OFFICE VISIT   HISTORY:  A 45 year old female returns for recheck of her wounds that  began in February and March 2010. They were attributed to possible drug  reaction to Cipro resulting in necrotizing vasculitis in February.  She  recently had another biopsy of her lesions last week by Dr. Venancio Poisson.  Results of this are still pending.  The patient feels like the Silvadene  cream, which she was put on may have been helpful for at least certain  of her wounds.  Culture from August 08, 2008, did return showing an  enterococcus species sensitive to ampicillin and vancomycin.  The  patient believes that she is responding to the doxycycline that she was  started on last week as well as her Silvadene cream.  She is not running  any fevers.  Lesions on the left leg appear to be healing.  Lesions on  the right leg are much the same as previously noted.  The patient claims  that she is allergic to PENICILLIN as a child, but she has not had any  of this drug as an adult.  She is a little reluctant to starting this  antibiotic, but not totally adverse to it.   PHYSICAL EXAMINATION:  VITAL SIGNS:  Temperature 98.6, pulse 90,  respirations 20, and blood pressure 134/83.   Capillary glucose 246 mg percent.   Wounds were all remeasured and appear approximately of same dimensions  as was previously noted, except for some improvement in the left lower  extremity upper wound, which appears to be somewhat smaller than  previously noted as well as the left lower extremity distally.  The  lesions of the right leg continue to have a somewhat punched out  appearance.  There is some mild amount of drainage.  There is discomfort  with palpation  around the wounds.   TREATMENT:  We reapplied Silvadene cream and Adaptic gauze.  The patient  is to continue to do this daily at home.  She is to see Dr. Venancio Poisson  next week and also to see Dr. Stacey Drain, on August 20, 2008.   The patient states that Dr. Nicholas Lose mentioned that the dermatologist may  send her to a special clinic in [place] for further evaluation of  her problems depending upon the biopsy report.   ICD-9 code 446.29, leukocytoclastic vasculitis.   250.83, diabetes mellitus with ulcers.   CPT L6338996.      Lenon Curt Chilton Si, M.D.  Electronically Signed     AGG/MEDQ  D:  08/15/2008  T:  08/16/2008  Job:  [id number]   cc:   Noralee Stain, MD  Aundra Dubin, M.D.

## 2010-09-07 NOTE — Assessment & Plan Note (Signed)
[facility]   NAME:  [name], [name] NO.:  [id number]   MEDICAL RECORD NO.:  [id number]      DATE OF BIRTH:  1965-07-10   PHYSICIAN:  Joanne Gavel, M.D.         VISIT DATE:  10/15/2008                                   OFFICE VISIT   HISTORY OF PRESENT ILLNESS:  [name] is a 45 year old woman with a  history of leukocytoclastic vasculitis with multiple skin lesions.  These have been treated with Silvadene ointment and with light pressure  wraps.  On examination today, the wounds are all healed.  We have no  further recommendations.  She is to return p.r.n.      Joanne Gavel, M.D.  Electronically Signed     RA/MEDQ  D:  10/15/2008  T:  10/16/2008  Job:  [id number]

## 2010-09-07 NOTE — Assessment & Plan Note (Signed)
[facility]   NAME:  [name], [name] NO.:  [id number]   MEDICAL RECORD NO.:  [id number]      DATE OF BIRTH:  04-08-1966   PHYSICIAN:  Maxwell Caul, M.D.      VISIT DATE:                                   OFFICE VISIT   [name] is a 45 year old woman with a history of leukocytoclastic  vasculitis with multiple skin lesions and ulceration which she tells me  has been problematic since January of this year.  She has also been  followed by Dr. Nicholas Lose of dermatology.   Overall, the wounds are doing much better.  She is applying the  Silvadene cream.   On examination, she is afebrile.  The only wound is left on the distal  left lower extremity which has 2 tiny small openings.  I cleaned these  up with a light nonexcisional debridement.  The open areas are tiny and  actually look very healthy and appear to be on their way to healing.   IMPRESSION:  Leukocytoclastic vasculitis with resultant wounds.   I continued the Silvadene, Adaptic, and gauze dressing.  She will return  in 2 weeks.           ______________________________  Maxwell Caul, M.D.     MGR/MEDQ  D:  09/19/2008  T:  09/20/2008  Job:  [id number]

## 2010-09-07 NOTE — Group Therapy Note (Signed)
NAME:  LYLE, LEISNER NO.:  [id number]   MEDICAL RECORD NO.:  [id number]          PATIENT TYPE:  WOC   LOCATION:  [facility]                   FACILITY:  [facility]   PHYSICIAN:  Tinnie Gens, MD        DATE OF BIRTH:  January 10, 1966   DATE OF SERVICE:  10/25/2007                                  CLINIC NOTE   CHIEF COMPLAINT:  Follow-up.   HISTORY OF PRESENT ILLNESS:  The patient is a 45 year old nulligravida  patient who comes in today for follow-up.  She was previously seen for  abnormal uterine bleeding and underwent endometrial sampling, Pap smear,  mammography and pelvic sonogram approximately 3 weeks ago or 4 weeks  ago.  She is back today for follow up of results of these.  Her Pap  smear shows normal cells.  Her endometrial biopsy shows proliferative  type endometrium.  Her pelvic sonogram shows a 4.5 x 4.3 x 4.5-cm uterus  in the left uterine body.  The second fibroid is in the lower uterine  segment measuring 1.6 x 1.5 x 1.5.  Not significant change since last  exam in 2007.  The patient's mammogram was normal.  She desires some  sort of permanent treatment.  Choices were discussed with her including  hydrothermal ablation and hysterectomy as treatment for fibroids.  She  is not really candidate for oral contraceptives given that she is a  smoker and 45 years old.  Did discuss the possibility of IUD, and I am  not sure this will work for her.  The patient is to consider her options  and call back in approximately 1 week with a decision about what sort of  procedure she would like to have done, at which time she will be posted  by myself.           ______________________________  Tinnie Gens, MD     TP/MEDQ  D:  10/25/2007  T:  10/25/2007  Job:  [id number]

## 2010-09-07 NOTE — Discharge Summary (Signed)
NAME:  [name], [name]                 ACCOUNT NO.:  [id number]   MEDICAL RECORD NO.:  [id number]          PATIENT TYPE:  INP   LOCATION:  5154                         FACILITY:  [facility]   PHYSICIAN:  Hind I Elsaid, MD      DATE OF BIRTH:  [birth year]   DATE OF ADMISSION:  06/19/2008  DATE OF DISCHARGE:  06/30/2008                               DISCHARGE SUMMARY   DISCHARGE DIAGNOSES:  1. Leukocytoclastic vasculitis.  2. Elevated antinuclear antibody and high myeloperoxidase antibody.  3. Steroid-induced hyperglycemia.   DISCHARGE MEDICATIONS:  1. Prednisone 60 mg p.o. taper dose for 3 weeks.  2. Metformin 250 mg twice daily with insulin sliding scale.  3. Percocet 10/325 p.o. q.6 h. p.r.n.  4. Polysporin cream, apply to the skin.   Add on to discharge diagnoses done by Dr. Isidor Holts.  The  patient's biopsy of the skin did show severe leukocytoclastic  vasculitis, which could be related to infection or drugs.  Possibly  induced by Aleve or by Cipro.  The patient also found to have elevated  antinuclear antibodies, and elevated antiproteinase 3 is 11.4 with  elevated p-ANCA.  The patient was started on Solu-Medrol IV with  significant improvement in her symptoms.  Dermatology and Infectious  Disease remain on the case and as the patient has mother with a history  of focal segmental glomerulosclerosis, the patient requires Rheumatology  followup as an outpatient.  At this time, the patient will be discharged  with tapered dose of steroid with metformin 250 mg twice daily.  An  appointment was made to see Family Practice on Friday for followup of  her fingersticks or refer her to a rheumatologist as the patient has no  insurance.  Also Social Services contacted to help with the financial.  At this time, we felt the patient is medically stable to be discharged.  Follow with Dr. Clelia Croft at the [facility] on Friday.      Hind Bosie Helper, MD  Electronically Signed     HIE/MEDQ   D:  06/30/2008  T:  07/01/2008  Job:  [id number]

## 2010-09-07 NOTE — Assessment & Plan Note (Signed)
[facility]   NAME:  [name], [name]                 ACCOUNT NO.:  [id number]   MEDICAL RECORD NO.:  [id number]      DATE OF BIRTH:  01/20/1966   PHYSICIAN:  Lenon Curt. Chilton Si, M.D.        VISIT DATE:                                   OFFICE VISIT   HISTORY:  A 45 year old female with prior history of leukocytoclastic  vasculitis and multiple skin lesions with ulceration, [facility] for recheck.   Since last seen, she says she got the biopsy report back from Dr. Nicholes Calamity, and she was asked to send a copy to this clinic.  It does not  appear that is in her chart or has been received.  At this time, we will  check with Dr. Nicholas Lose in regards to the biopsy.  She is also seeing Dr.  Kellie Simmering, and based on a changed diagnosis on a biopsy, he has  recommended a new medication for that, she has been on about a week or  two.  She is unable to tell us the name of this drug.   She feels that her wounds are doing better and are beginning to heal and  the measurements would indicate several are healed and that others are  shrinking in dimension.   PHYSICAL EXAMINATION:  Temp 98.3, pulse 96, respirations 20, blood  pressure 130/91.  Capillary glucose 153.   WOUND MEASUREMENTS:  1. Right lower extremity 12.5 x 4.6 x 0.3 cm.  2. Left lower extremity in the upper portion are healed.  3. Left lower extremity and proximal area now healed.  4. Left lower extremity distally now 1.0 x 1.2 x 0.1 cm.  The wound of      the right lower extremity continues to be large but is not draining      a lot of material and does not appear to be too tender at the      present time.   TREATMENT:  Reapplied Silvadene cream to all wounds, Adaptic gauze.  She  will do this daily.  She is to return in 2 weeks for reevaluation.   We will attempt to obtain biopsy report from Dr. Venancio Poisson and the  name of her new medication from Dr. Stacey Drain.   ICD-9 CODE:  446.29  leukocytoclastic vasculitis.   CPT CODE:  54098.      Lenon Curt Chilton Si, M.D.  Electronically Signed    AGG/MEDQ  D:  09/05/2008  T:  09/06/2008  Job:  [id number]

## 2010-09-07 NOTE — H&P (Signed)
[name], [name]                 ACCOUNT NO.:  [id number]   MEDICAL RECORD NO.:  [id number]          PATIENT TYPE:  INP   LOCATION:  6735                         FACILITY:  [facility]   PHYSICIAN:  Renee Ramus, MD       DATE OF BIRTH:  [birth year]   DATE OF ADMISSION:  06/19/2008  DATE OF DISCHARGE:                              HISTORY & PHYSICAL   The patient does not have a primary care physician.  The patient is  unassigned.   HISTORY OF PRESENT ILLNESS:  The patient is a 46 year old female who was  seen in the emergency department secondary to acute pain in the right  lower extremity.  The patient has been on ciprofloxacin x3 weeks for  bronchiolitis.  The patient noticed these lesions appearing on her lower  extremity and abdomen approximately 2-3 days prior to admission.  Initially, they were uncomfortable, but not painful, however, the  lesions on her leg became acutely painful and that is why she presented.  The patient has no previous history of these symptoms.  The patient has  no previous history of vasculitis or any type of autoimmune disease.   PAST MEDICAL HISTORY:  1. Uterine fibroids.  2. Bronchitis.  3. Tobacco history.   SOCIAL HISTORY:  The patient reports smoking 3 quarters of pack a day.  No alcohol use.  Lives with her husband.   FAMILY HISTORY:  Not available.   REVIEW OF SYSTEMS:  All other comprehensive review systems are negative.   ALLERGIES:  The patient has allergies to PENICILLIN and CODEINE.   CURRENT MEDICATION:  Currently just taking Aleve p.r.n.   PHYSICAL EXAMINATION:  GENERAL:  This is a well-developed, well-  nourished black female, currently in no apparent distress.  VITAL SIGNS:  Blood pressure 107/68, heart rate 94, respiratory rate 18,  and temperature 98.5.  NECK:  No jugular venous distention or lymphadenopathy.  HEENT:  Oropharynx is clear.  Mucous membranes are pink and moist.  TMs  are clear bilaterally.  Pupils are equal and  reactive to light and  accommodation.  Extraocular muscles are intact.  CARDIOVASCULAR:  Regular rate and rhythm without murmurs, rubs, or  gallops.  PULMONARY:  Lungs are clear to auscultation bilaterally.  ABDOMEN:  Soft and obese.  EXTREMITIES:  The patient does have palpable purpura on the right lower  extremity, exquisitely painful to palpation.  NEURO:  Cranial nerves II-XII are grossly intact.  The patient has no  focal neurological deficits.   STUDIES:  Chest x-ray shows pneumonia in the left lower lobe.   LABORATORY DATA:  Albumin 2.9, white count 4.4, H&H 12 and 36, MCV 70,  and platelets 225.  Sodium 135, potassium 3.7, chloride 104, bicarb 24,  BUN 9, creatinine 0.7, and glucose 123.  UA shows specific gravity of  1.028 which is also positive for blood.   ASSESSMENT/PLAN:  1. Question of cutaneous leukocytoclastic vasculitis.  The patient      does have hematuria and I am concerned about renal involvement with      pulmonary changes.  I am  also concerned of some type of      renopulmonary involvement, i.e., Goodpasture or Wegener      granulomatosis.  We will discontinue Cipro.  We will check      hepatitis panel, CK, LFTs, ESR, CRP, ANA complement, cANCA, and p-      ANCA, also obtain a tissue biopsy.  We will need rheumatology input      at a minimum and we will try to contact Dermatology for biopsy.  2. Pneumonia.  No evidence currently of acute active infection.  We      will defer antibiotic treatment for now.  3. Tobacco use.  We will place the patient on nicotine patch.   DISPOSITION:  The patient is full code.  H&P was constructed by  reviewing past medical history, conferring with emergency medical room  physician, and reviewing the emergency medical record.   TIME SPENT:  One hour.      Renee Ramus, MD  Electronically Signed     JF/MEDQ  D:  06/20/2008  T:  06/20/2008  Job:  [id number]

## 2010-09-07 NOTE — Group Therapy Note (Signed)
NAME:  [name], RYNDERS NO.:  [id number]   MEDICAL RECORD NO.:  [id number]          PATIENT TYPE:  WOC   LOCATION[facility]                   FACILITY:  [facility]   PHYSICIAN:  Allie Bossier, MD        DATE OF BIRTH:  02-Aug-1965   DATE OF SERVICE:  04/03/2008                                  CLINIC NOTE   [name] is a 45 year old single black gravida 0 who is here for a  visit to schedule surgery.  She has been seen for abnormal uterine  bleeding.  Uterine sampling with endometrial biopsy showed proliferative  type endometrium with breakdown.  Her Pap smear was normal and an  ultrasound showed two uterine fibroids, the largest measuring 4-1/2 cm.  This was slightly increased in size since her ultrasound in 2007.  The  endometrial thickness was 11 mm.  The ovaries appeared normal.  She also  complains of associated pelvic pain, dyspareunia, dysmenorrhea and  menorrhagia.  She said she has to change her pads frequently and has  been having deep dyspareunia for the last 2 years.   REVIEW OF SYSTEMS:  She has been monogamous for 4 years.  She is  unemployed and she has been having unprotected sex essentially her whole  life without achieving pregnancy.  Of note, she does not want pregnancy.  Her Pap smear and mammogram were  normal this year.   PAST SURGICAL HISTORY:  Appendectomy, tonsillectomy.   MEDICATIONS:  Aspirin as necessary, Pamprin as necessary.   SOCIAL HISTORY:  She was a previous cocaine user but has been clean  for the last 36 months.  She does smoke 3/4 of a pack a day of  cigarettes and has been a smoker for 20 years.  She denies alcohol use.   ALLERGIES:  PENICILLIN AND CODEINE.  No latex allergy.   FAMILY HISTORY:  Is significant for breast cancer in a maternal aunt who  died in her 77s.  She denies family history of GYN and colon cancer.   PHYSICAL EXAM:  Weight 232 pounds, height 5 feet 2 inches.  Blood  pressure 129/73, pulse 80.  HEART:   Regular rate and rhythm.  ABDOMEN:  Obese, benign.   I believe that I will be able to remove this uterus through a low-  transverse incision.  Ms. Moulin and I have discussed the possibility of  a postop wound infection due to her pannus and her morbid obesity.  Risks of surgery  including but not limited to infection, bleeding, damage to bowel,  bladder and ureters have also been discussed.  She wishes to proceed.  Paperwork will be turned in so as to schedule this.      Allie Bossier, MD     MCD/MEDQ  D:  04/03/2008  T:  04/03/2008  Job:  [id number]

## 2010-09-07 NOTE — Group Therapy Note (Signed)
[name], [name]                 ACCOUNT NO.:  [id number]   MEDICAL RECORD NO.:  [id number]          PATIENT TYPE:  INP   LOCATION:  5154                         FACILITY:  [facility]   PHYSICIAN:  Isidor Holts, M.D.  DATE OF BIRTH:  1965-06-22                                 PROGRESS NOTE   PMD:  Unassigned.   DISCHARGE DIAGNOSES:  1. Vasculitis, query etiology.  2. Left lower lobe pneumonia.  3. Smoking history.  4. History of uterine fibroids.   DISCHARGE MEDICATIONS:  These will be listed in an addendum at the appropriate time, by the  discharging MD.   PROCEDURES:  1. Chest x-ray date June 20, 2008.  This showed clinically      recurring pneumonia in the left lower lobe with small effusion.  2. Skin biopsy done June 20, 2008 by Dr. Venancio Poisson,      dermatologist.   CONSULTATIONS:  1. Dr. Venancio Poisson, [facility].  2. Dr. Lina Sayre, infectious disease specialist.   ADMISSION HISTORY:  As in the H and P notes of June 19, 2008, dictated by Dr. Renee Ramus. However, in brief, this is a 45 year old female, with a known  history of uterine fibroids, smoking history, presenting to the  emergency department with acute pain in the right lower extremity  associated with skin lesions.  On further questioning, it appears that  the patient had been treated in the preceding 3 weeks for  bronchiolitis and had been on a course of Ciprofloxacin for that  period of time.  She was admitted for further evaluation, investigation,  and management.   CLINICAL COURSE:  1. Acute vasculitis.  For details of presentation, refer to the      admission history above.  The patient, on physical examination, was      found to have bilateral lower extremity palpable purpuric lesions.      Vasculitis workup was commenced, including serum complement levels,      antinuclear antibody, C-ANCA, P-ANCA, ESR.  An STD screen was done,      including GC/Chlamydia probe, RPR, HIV  testing, hepatitis B surface      antibody.  All these tests were negative, as was streptococcal      probe, although at the time of this dictation, serum complement, P-      ANCA, and C-ANCA were still pending.  Consultations were kindly      provided by Dr. Venancio Poisson of [facility] as well as      Dr. Lina Sayre, infectious disease specialist.  Dr. Maurice March      recommended starting the patient empirically on intravenous      Rocephin.  Dr. Nicholas Lose did a biopsy on June 20, 2008.      Unfortunately, at the time of this dictation, the pathology report      was still pending.  The patient was managed for pain with a      Dilaudid PCA pump, as intermittent doses of Dilaudid appeared to be      quite unhelpful.  During the course of her hospitalization,  she has      experienced progression of her skin lesions, which in places have      actually coalesced and have a somewhat necrotic center, as well as      bullous formation.  She has no mucous membrane involvement.  These      lesions are characterized by perifocal redness, swelling, and pain.      She has had no episodes of pyrexia in the course of her      hospitalization.  As new lesions appear, there appears to be some      stabilization of the old lesions.  At the time of this dictation,      on June 24, 2008, a definitive diagnosis was still questionable.   1. Smoking history.  The patient has been counseled appropriately, and      managed with Nicoderm CQ patch.   1. Left lower lobe pneumonia.  As mentioned above, the patient did      present with a history of 3 weeks of antibiotic therapy, for      presumed bronchiolitis.  A chest x-ray done demonstrated      persistent pneumonia of the left lower lobe.  The patient also had      a cough productive of yellowish phlegm.  She was therefore      commenced on oral Azithromycin.  At the time of this dictation, she      was on day #3 of a planned 7-day course of Azithromycin  therapy.   DISPOSITION:  This will be elucidated in detail in an addendum at the proper time, by  the discharging MD, however, at the time of this dictation, as mentioned  above, a definitive diagnosis was still unclear.  The feeling at the  time of presentation, was that the patient would benefit from  rheumatology input.  I have personally called on June 24, 2008, Dr.  Ines Bloomer office.  He reportedly, was out of town.  I did call Dr.  Jimmy Footman, Janalyn Harder, et al,  and according to their office  secretary, they no longer do inpatient consultations.  I did contact Dr.  Vernona Rieger Lomax's office in the p.m. of June 24, 2008.  She has reportedly  signed out to Dr. [place] as she is out of town, and Dr. Elvis Coil input  and subsequent recommendations are eagerly awaited.      Isidor Holts, M.D.  Electronically Signed     CO/MEDQ  D:  06/24/2008  T:  06/24/2008  Job:  [id number]

## 2010-09-07 NOTE — Assessment & Plan Note (Signed)
[facility]   NAME:  [name], [name]                 ACCOUNT NO.:  [id number]   MEDICAL RECORD NO.:  [id number]      DATE OF BIRTH:  05-10-1965   PHYSICIAN:  Lenon Curt. Chilton Si, M.D.        VISIT DATE:                                   OFFICE VISIT   HISTORY:  A 45 year old female [facility]  for nonhealing wounds.  The patient has had problems with her skin since  late January 2010.  She was treated for bronchitis or pneumonia with  Cipro and then began blistering all over including a chest, breasts,  arms, legs and abdomen.  The patient was admitted into the hospital,  ultimately went to Intensive Care.  Consultations were done with Dr.  Kellie Simmering, and Dr. Yetta Barre and Dr. Nicholas Lose.  Biopsy was done of her lesions  and report suggested possible leukocytoclastic vasculitis.  This was  presumed secondary treatment of the Cipro.  The patient was put on high-  dose steroids and she seemed to slowly improve in regards to the wounds  in many areas, however, some have continued to be a problem and do not  seem to be closing.  Wound started as blisters in all cases. Some are  very superficial and simply peeled off after a period of time, others  penetrated deeper into the skin.  She currently is using Neosporin  ointment and bandages to those residual wounds that she has.   There was a recent increase in right leg swelling.  She actually  underwent Doppler studies on July 11, 2008, however, it did not show a  DVT at that time.   The patient is currently followed by Dr. Cam Hai, her primary care  doctor, [facility] outpatient, Dr. Kellie Simmering, rheumatologist and Dr. Venancio Poisson, dermatologist.  She says she is scheduled to have a repeat skin  biopsy August 13, 2008 by Dr. Nicholas Lose.   She recently was treated with Keflex for the last 10 days by Dr.  Kellie Simmering.  She has not seen any improvement in her wounds that have not  been healing.   Previous evaluation  of these wounds in addition to the skin biopsy  included multiple lab tests in the hospital which included an  antinuclear antibody that was reported positive at 1:80 as well as what  appears to be an elevation in CH50 of greater than 60 with a normal  range between 31-66.  Cryoglobulins were negative.  Her hepatitis A  antibody was positive.   Past medical history includes problems of uterine fibroid diabetes  mellitus and her left lower lobe pneumonia in February and March 2010.   CURRENT MEDICATIONS:  1. Glucophage 500 mg twice daily.  2. Percocet 5/325 1-2 tablets twice daily.  3. Prednisone 10 mg 6 tablets daily under the guidance of Dr. Kellie Simmering.  4. Keflex 500 mg q.i.d.  5. Calcium plus D 600/400 one twice daily.   ALLERGIES:  Reported to PENICILLIN.   Family history is positive for diabetes in mother and with cancer of the  breast.   SOCIAL HISTORY:  The patient is married, lives with her husband.  She  smokes up to the third of pack cigarettes per  day.  There is no alcohol.  She previously has worked a person driving the patient's to  appointments, however, has been out of work now for several weeks.   Review of systems essentially is unremarkable except as described in the  history of present illness.   She says her diabetes control has been terrible since being on so much  prednisone.  She denies any recent fever or chills.   PHYSICAL EXAMINATION:  VITAL SIGNS:  Temperature is 99.0, pulse 102,  respirations 20, blood pressure 142/85.  Capillary glucose 235 mg  percent.  GENERAL:  Moderately overweight female, alert, cooperative and  reasonable historian given the complexity of her illness.  SKIN:  Several wounds were measured including, #1 of the right lower  extremity of 14 x 4.1 x 0.1 cm. #2 of the left lower extremity upper  level 3.9 x 2.5 x 0.2 cm.  Wound #3, of the left lower extremity upper  at 1.0 x 1.8 x 0.1 cm in the left lower extremity lower wound.  #4,  of  4.0 x 1.2 x 0.1 cm.  These wounds, however somewhat punched out of  appearance.  There are multiple areas of scarring. There are couple of  areas which appear to have a blister and the top layer of the blisters  simply stuck down on top of the skin with no fluid underneath it.  The  areas of ulceration noted above are slightly tender to palpation.  Legs  are swollen bilaterally, however they are not particularly tender.  NEUROLOGIC:  The patient appears be neurologically intact.  HEAD, EYES, EARS, NOSE AND THROAT:  Unremarkable.  NECK:  Supple without thyromegaly, nodule or mass.  CHEST:  Clear.  HEART:  Regular rhythm, mild tachycardia.  No gallop or murmur.  ABDOMEN:  Obese, nontender.  No organomegaly.  EXTREMITIES:  Moves all four, 1+ bipedal edema.   TREATMENT:  All open wounds were covered with silver sulfadiazine cream  and gauze and the patient was to do this at home daily.  Tissue cultures  were obtained from the right leg open ulceration.   I am suspicious of these wounds are secondarily infected from what ever  the primary cause was.  The wounds look suspiciously like bullous  pemphigoid to me however the previous diagnosis of leukocytoclastic  vasculitis certainly is possible as well.   We asked her to stop the Keflex since it has gone no where in helping  these wounds over the last 10 days. I switched her to doxycycline 100 mg  twice daily for 15 days.  We will await wound culture and report to her  whether she should continue with the doxycycline.   The patient is asked to return to clinic in 1 week. Hopefully we will  have the biopsy report from Dr. Nicholas Lose.  We requested the patient to  advise Dr. Nicholas Lose to send a biopsy report [facility].   ICD-9 code: 446.29 leukocytoclastic vasculitis;  250.83 diabetes  mellitus with ulcers.   CPT code 60454.      Lenon Curt Chilton Si, M.D.  Electronically Signed     AGG/MEDQ  D:  08/08/2008  T:   08/09/2008  Job:  [id number]   cc:   Aundra Dubin, M.D.  Cam Hai, C.N.M.  Venancio Poisson

## 2010-09-10 NOTE — Group Therapy Note (Signed)
NAME:  [name], [name] NO.:  [id number]   MEDICAL RECORD NO.:  [id number]          PATIENT TYPE:  WOC   LOCATION:  [facility]                   FACILITY:  [facility]   PHYSICIAN:  Argentina Donovan, MD        DATE OF BIRTH:  08/23/1965   DATE OF SERVICE:                                  CLINIC NOTE   CHIEF COMPLAINT:  New patient evaluation for menorrhagia.   HISTORY OF PRESENT ILLNESS:  Patient is a 45 year old female who  presents with a one year history of increasingly heavy periods.  Her  periods have also been increasingly painful.  They have been irregular  for the past year.  She did at one point require treatment with a 10-day  course of oral Provera to correct the bleeding in March of this year.  She had a pelvic ultrasound in [facility], which did show fibroid  tumors.  A repeat ultrasound once she was established here in [place]  in August, 2007 likewise showed a 3.8 x 2.6 x 3.2 cm fibroid with  endometrial stripe normal at 0.2 cm.  Her last menstrual period was  February 27, 2006.  As mentioned above, her periods have been irregular  for the past year.  Her last Pap smear was June, 2007, this year, which  was normal.  She has not had any bleeding or spotting outside of her  menstrual periods.  Her periods last 5-7 days.   PAST MEDICAL HISTORY:  Unremarkable.   PAST SURGICAL HISTORY:  Appendectomy in 1993.   FAMILY HISTORY:  Mom with hypertension.  Dad with coronary artery  disease as well as an MI in his 59s.  Brother with hypertension.  No  history of uterine fibroids.  No history of breast cancer.  No history  of cervical or uterine cancer.   SOCIAL HISTORY:  Smokes one pack per day for the past 26 years.  No  alcohol use.  History of chronic cocaine abuse but has been clean for  over a year.  She is single without children.   ALLERGIES:  PENICILLIN.  Does not remember the reaction.   MEDICATIONS:  None currently.   REVIEW OF SYSTEMS:  No  fevers,chills, night sweats, headaches,  dizziness, blurry vision, lightheadedness, weakness, chest pain or  shortness of breath.   PHYSICAL EXAMINATION:  VITAL SIGNS:  Noted.  GENERAL:  Well-appearing.  No acute distress.  HEENT:  Sclerae are normal in appearance.  Mucous membranes are moist.  NECK:  No carotid bruits.  No thyromegaly or thyroid nodules.  LUNGS:  No __________.  Clear to auscultation bilaterally.  HEART:  Regular rate and rhythm.  No murmurs, rubs or gallops.  ABDOMEN:  Positive bowel sounds.  Soft, nontender, nondistended.  PELVIC:  Bimanual exam revealed mild fullness on the left side of the  uterus.  No cervical motion tenderness.  No adnexal masses or  tenderness.   IMPRESSION:  Menorrhagia, likely secondary to uterine fibroid as well as  perimenopausal state.   PLAN:  Menorrhagia:  We will check CBC and TSH.  I gave the patient a  handout on thermal endometrial ablation.  Patient will return in two  weeks to discuss this option.  The patient is not a candidate for  hormonal therapy, as she is a smoker.  Counseled the patient on the  importance of smoking cessation.     ______________________________  Argentina Donovan, MD    ______________________________  Argentina Donovan, MD    PR/MEDQ  D:  04/20/2006  T:  04/20/2006  Job:  [id number]

## 2010-09-28 ENCOUNTER — Encounter: Payer: Self-pay | Admitting: Family Medicine

## 2010-10-07 ENCOUNTER — Observation Stay (HOSPITAL_COMMUNITY)
Admission: EM | Admit: 2010-10-07 | Discharge: 2010-10-08 | DRG: 195 | Disposition: A | Discharge status: Home / Self Care | Payer: Medicaid Other | Attending: Family Medicine | Admitting: Family Medicine

## 2010-10-07 ENCOUNTER — Emergency Department (HOSPITAL_COMMUNITY): Payer: Medicaid Other

## 2010-10-07 DIAGNOSIS — E119 Type 2 diabetes mellitus without complications: Secondary | ICD-10-CM | POA: Insufficient documentation

## 2010-10-07 DIAGNOSIS — J159 Unspecified bacterial pneumonia: Secondary | ICD-10-CM

## 2010-10-07 DIAGNOSIS — M329 Systemic lupus erythematosus, unspecified: Secondary | ICD-10-CM

## 2010-10-07 DIAGNOSIS — Z79899 Other long term (current) drug therapy: Secondary | ICD-10-CM | POA: Insufficient documentation

## 2010-10-07 DIAGNOSIS — IMO0002 Reserved for concepts with insufficient information to code with codable children: Secondary | ICD-10-CM | POA: Insufficient documentation

## 2010-10-07 DIAGNOSIS — F17200 Nicotine dependence, unspecified, uncomplicated: Secondary | ICD-10-CM | POA: Insufficient documentation

## 2010-10-07 DIAGNOSIS — M2550 Pain in unspecified joint: Secondary | ICD-10-CM

## 2010-10-07 DIAGNOSIS — J189 Pneumonia, unspecified organism: Principal | ICD-10-CM | POA: Insufficient documentation

## 2010-10-07 DIAGNOSIS — E669 Obesity, unspecified: Secondary | ICD-10-CM | POA: Insufficient documentation

## 2010-10-07 LAB — BASIC METABOLIC PANEL
BUN: 12 mg/dL (ref 6–23)
Chloride: 102 mEq/L (ref 96–112)
GFR calc Af Amer: >60 mL/min (ref >60)
GFR calc non Af Amer: >60 mL/min (ref >60)
Potassium: 4 mEq/L (ref 3.5–5.1)

## 2010-10-07 LAB — CBC
HCT: 34.5 % — ABNORMAL LOW (ref 36.0–46.0)
MCHC: 29.6 g/dL — ABNORMAL LOW (ref 30.0–36.0)
Platelets: 328 10*3/uL (ref 150–400)
RDW: 16.7 % — ABNORMAL HIGH (ref 11.5–15.5)
WBC: 11.2 10*3/uL — ABNORMAL HIGH (ref 4.0–10.5)

## 2010-10-07 LAB — DIFFERENTIAL
Basophils Absolute: 0 10*3/uL (ref 0.0–0.1)
Basophils Relative: 0 % (ref 0–1)
Eosinophils Absolute: 0.2 10*3/uL (ref 0.0–0.7)
Eosinophils Relative: 2 % (ref 0–5)
Lymphocytes Relative: 12 % (ref 12–46)
Monocytes Absolute: 0.6 10*3/uL (ref 0.1–1.0)

## 2010-10-08 ENCOUNTER — Inpatient Hospital Stay (HOSPITAL_COMMUNITY): Payer: Medicaid Other

## 2010-10-08 ENCOUNTER — Telehealth: Payer: Self-pay | Admitting: Family Medicine

## 2010-10-08 ENCOUNTER — Encounter: Payer: Self-pay | Admitting: Family Medicine

## 2010-10-08 LAB — CBC
HCT: 28.9 % — ABNORMAL LOW (ref 36.0–46.0)
MCHC: 30.1 g/dL (ref 30.0–36.0)
MCV: 67.1 fL — ABNORMAL LOW (ref 78.0–100.0)
Platelets: 270 10*3/uL (ref 150–400)
RDW: 16.6 % — ABNORMAL HIGH (ref 11.5–15.5)

## 2010-10-08 LAB — URINALYSIS, ROUTINE W REFLEX MICROSCOPIC
Ketones, ur: 15 mg/dL — AB
Leukocytes, UA: NEGATIVE
Nitrite: NEGATIVE
Specific Gravity, Urine: 1.045 — ABNORMAL HIGH (ref 1.005–1.030)
Urobilinogen, UA: 0.2 mg/dL (ref 0.0–1.0)

## 2010-10-08 LAB — BASIC METABOLIC PANEL
BUN: 15 mg/dL (ref 6–23)
Calcium: 8.3 mg/dL — ABNORMAL LOW (ref 8.4–10.5)
Creatinine, Ser: 0.72 mg/dL (ref 0.50–1.10)
GFR calc Af Amer: >60 mL/min (ref >60)
GFR calc non Af Amer: >60 mL/min (ref >60)

## 2010-10-08 LAB — URINE MICROSCOPIC-ADD ON

## 2010-10-08 NOTE — Progress Notes (Signed)
Family Medicine Teaching [facility] Admission History and Physical  Patient name: [name] Medical record number: [id number] Date of birth: [birth year] Age: 45 y.o. Gender: female  Primary Care Provider: Priscella Mann, MD  Chief Complaint: joint pain History of Present Illness: [name] is a 45 y.o. year old female presenting with joint pain  X 1 day.  Pt states that she started methotrexate 1 week ago.  On Tuesday she took her second dose.  Yesterday she began to have increased pain in her joints that worsened today and brought her to the hospital.  Pt notes a URI including cough but denies fever, chills.  She states that she actually has begun to feel better from this over the last 2 days.  CXR in ED was concerning for bilateral PNA.  Considering comorbidities, the decision was made to admit for pain management and PNA treatment.  Patient Active Problem List  Diagnoses  . OBESITY, UNSPECIFIED  . TOBACCO ABUSE  . PERIPHERAL NEUROPATHY  . Lupus erythematosus  . UNSPECIFIED ARTHROPATHY MULTIPLE SITES  . HYPERGLYCEMIA  . Piriformis syndrome  . LEG PAIN, RIGHT   Past Medical History: SLE, obesity, thrombotic vasculitis  Past Surgical History: none  Social History:  The patient lives with mother.  She is currently on   disability due to vasculitis  Family History: Aunt, breast cancer; otherwise noncontributory.  Allergies: Allergies  Allergen Reactions  . Penicillins     REACTION: Rash and hives as child, per pt's mother    Current Outpatient Prescriptions  Medication Sig Dispense Refill  . Calcium Carbonate-Vitamin D (CALTRATE 600+D) 600-400 MG-UNIT per tablet Take 1 tablet by mouth two times a day per Dr Kellie Simmering while on prednsione       . gabapentin (NEURONTIN) 300 MG capsule One tab PO qHS for a week, then 2 tabs PO qHS for a week, then increase up to three tabs TID as needed.  90 capsule  2  . Hydroxychloroquine Sulfate (PLAQUENIL PO) Take by mouth  2 (two) times daily.        . metFORMIN (GLUCOPHAGE) 500 MG tablet Take 500 mg by mouth daily.        . naproxen (NAPROSYN) 500 MG tablet Take 1 tablet (500 mg total) by mouth 2 (two) times daily with meals. For 10 days  60 tablet  6  . omeprazole (PRILOSEC) 40 MG capsule Take 1 capsule (40 mg total) by mouth daily.  30 capsule  11  . predniSONE (DELTASONE) 10 MG tablet 4 tabs by mouth daily for 5 days then take 1 tab by mouth daily       . traMADol (ULTRAM) 50 MG tablet Take 1 tablet (50 mg total) by mouth 2 (two) times daily. For pain  60 tablet  3  methotrexate q week  Review Of Systems: Per HPI with the following additions: has not had a TB skin test in several years.   Otherwise 12 point review of systems was performed and was unremarkable.  Physical Exam: Pulse: 100  Blood Pressure: 125/79 RR: 18   O2: 96 on RA Temp: 98.6  General: alert, cooperative, mild distress and moderately obese HEENT: PERRLA, extra ocular movement intact and trachea midline Heart: S1, S2 normal, no murmur, rub or gallop, regular rate and rhythm Lungs: clear to auscultation and unlabored breathing Abdomen: abdomen is soft without significant tenderness, masses, organomegaly or guarding Extremities: tender to palpation B knees and right wrist.  pt states that wrist is swollen and  on exam it is hot.  limited ROM in wrist.  full ROM of knees.  no effusion in the joints Skin:some keloids noted on legs Neurology: normal without focal findings, mental status, speech normal, alert and oriented x3, PERLA, muscle tone and strength normal and symmetric and sensation grossly normal  Labs and Imaging: Lab Results  Component Value Date/Time   NA 135 10/07/2010 11:10 PM   K 4.0 10/07/2010 11:10 PM   CL 102 10/07/2010 11:10 PM   CO2 22 10/07/2010 11:10 PM   BUN 12 10/07/2010 11:10 PM   CREATININE 0.76 10/07/2010 11:10 PM   GLUCOSE 158* 10/07/2010 11:10 PM   Lab Results  Component Value Date   WBC 11.2* 10/07/2010   HGB  10.2* 10/07/2010   HCT 34.5* 10/07/2010   MCV 67.0* 10/07/2010   PLT 328 10/07/2010   CXR Bilateral upper lobe infiltrates, likely infectious.  Bronchitic changes.   Assessment and Plan: [name] is a 45 y.o. year old female presenting with painful joints 2/2 SLE and CXR concerning for Bilateral PNA 1. PNA- pt with PCN allergy.  Will treat with avelox 400mg .  Currently on no O2, not in distress, mild elevation in WBC.  Will watch overnight to ensure that it doesn't progress.  Given recent methotrexate, would consider placing PPD to ensure that this is not reactivated TB if it did not quickly resolve. Pt states that her symptoms have been resolving spontaneously at home. 2.   SLE- flair of pain in joints.  Will treat with 1 dose of toradol.  May decide to continue NSAIDs if not contraindicated with methotrexate.  May need to call her rheumatologist in the AM if pain not controlled. 3.   DM-only on metformin at home.  Will start sensitive SSI while in hospital 4. FEN/GI: heart healthy diet, 1/2 NS at 125 ml/hr 5. Prophylaxis: heparin 5000 units TID, protonix 6. Disposition: pending improvement in joint pain, and stable respiratory status

## 2010-10-08 NOTE — Telephone Encounter (Signed)
Saw Dr. Katrinka Blazing a couple of weeks ago.  For lupus flares, recommend prednisone 30x5days, 20x5days, then 10. Expect response in the next couple of days.  Hold methotrexate while off antibiotics. Then re-start the following week.  For breakthrough pain, as long as Cr is okay, NSAIDs okay (try Mobic 15mg  po qd prn through flare). Try Tramadol also. 50-100 q6hrs.   Had started MTX and decreased prednisone from 10 to 7.5 2 weeks ago per patient request. Dr. Katrinka Blazing thought she would benefit from the higher dose of steroids but tried a trial of decreasing this and starting MTX.

## 2010-10-14 LAB — CULTURE, BLOOD (ROUTINE X 2)
Culture  Setup Time: 201206150925
Culture: NO GROWTH
Culture: NO GROWTH

## 2010-10-26 NOTE — Discharge Summary (Signed)
NAMEBRITTAY, [name] NO.:  [id number]  MEDICAL RECORD NO.:  [id number]  LOCATION:  [facility]                         FACILITY:  [facility]  PHYSICIAN:  Leighton Roach Breylin Dom, M.D.DATE OF BIRTH:  06-10-1965  DATE OF ADMISSION:  10/07/2010 DATE OF DISCHARGE:                              DISCHARGE SUMMARY   ATTENDING DOCTOR:  Leighton Roach Jessy Cybulski, MD  PRIMARY CARE PROVIDER:  Priscella Mann, MD, at [facility].  REASON FOR HOSPITALIZATION: 1. Joint flare pain in a patient with lupus. 2. Community-acquired pneumonia in a patient on immunosuppressants  DISCHARGE DIAGNOSES: 1. Community-acquired pneumonia. 2. Lupus Arthritis, exacerbation. 3. Type 2 diabetes.  MEDICATIONS:  New medications; 1. Tylenol 650 mg p.o. q.6 h. p.r.n. 2. Doxycycline 100 mg p.o. b.i.d. x6 days. 3. Gabapentin 900 mg p.o. t.i.d. 4. Prednisone 30 mg p.o. daily x5 days, then 20 mg p.o. daily x5 days,     then 10 mg p.o. daily. 5. Ultram 100 mg p.o. q.6 h. p.r.n.  Continue home medications; 1. Folic acid 1 mg p.o. daily. 2. Hydroxychloroquine 200 mg p.o. b.i.d. 3. Multivitamin p.o. daily  Discontinued medications; 1. Prednisone 7.5 mg p.o. daily. 2. Meloxicam 15 mg p.o. daily. 3. Methotrexate 10 mg p.o. weekly on Tuesdays.  CONSULTS:  None.  PROCEDURES:  Chest x-ray on admission showed bilateral lower lobe infiltrates, likely infectious.  Bronchitic changes.  PERTINENT LABORATORY FINDINGS:  White blood count on admission 11.2. Blood cultures negative x2.  BRIEF HOSPITAL COURSE:  This is a 44 year old African American female with a history of lupus who presents with joint pain flare and bilateral pneumonia. 1. Community-acquired pneumonia.  The patient was not complaining for     any respiratory symptoms on admission.  The patient was afebrile     with no respiratory distress.  However, a chest x-ray was     concerning for pneumonia and the patient's white blood count  was     slightly elevated.  The patient was treated with azithromycin and     ceftriaxone initially and then the antibiotics were switched to     doxycycline which she is to continue for another 6 days.  The     patient was satting well on room air throughout her     hospitalization. 2. Systemic lupus erythematosus presenting with joint flare pain.  The     patient has chronic joint pain that she feels that her joint pain     is worse than usual.  About two week ago the patient was seen by a     rheumatologist at [facility] who went down on her steroids from 10 mg to     7.5 mg per patient request.  The rheumatologist would have     preferred keeping her on the higher dose of the prednisone since     her joints were warm and she had some swelling, however, since the     patient was started on methotrexate at that time, he was amenable     to following her wishes to go down on her steroids.  The patient's     rheumatologist Dr. Katrinka Blazing was consulted during  the patient's     hospitalization and it was decided for the patient to receive a     high dose of steroids and then was to receive a steroid burst to     help with her current flare.  The patient was also asked to     continue her Plaquenil.  The patient was asked to hold her     methotrexate until after she finishes her doxycycline due to     possible interactions between these 2 medications.  The patient was     amenable to the plan.  The patient was asking to go home.     Regarding her pain, the patient was started on gabapentin and asked     to take tramadol and Tylenol as needed.  It is hoped that the     prednisone however will be able to best control the patient's pain. 3. Type 2 diabetes.  The patient's diabetes was controlled on a     sliding scale insulin regimen.  The patient's diabetes is secondary     to her being on chronic steroids.  DISCHARGE INSTRUCTIONS:  The patient was asked to follow up with her primary care provider Dr.  Madolyn Frieze in 4-6 weeks after seeing her rheumatologist.  The patient was asked to make an appointment with her rheumatologist in 2-4 weeks.    ______________________________ Priscella Mann, MD   ______________________________ Leighton Roach Ulys Favia, M.D.    AO/MEDQ  D:  10/12/2010  T:  10/13/2010  Job:  [id number]  cc:   [facility]  Electronically Signed by Priscella Mann MD on 10/19/2010 03:07:21 PM Electronically Signed by Acquanetta Belling M.D. on 10/26/2010 03:35:21 PM

## 2010-10-28 NOTE — H&P (Addendum)
[name], [name] NO.:  [id number]  MEDICAL RECORD NO.:  [id number]  LOCATION:  [facility]                         FACILITY:  [facility]  PHYSICIAN:  Dr. Sheffield Slider               DATE OF BIRTH:  1965/12/18  DATE OF ADMISSION:  10/07/2010 DATE OF DISCHARGE:                             HISTORY & PHYSICAL   PRIMARY CARE PHYSICIAN:  Kimberly Mann, MD, [facility].  CHIEF COMPLAINT:  Joint pain.  HISTORY OF PRESENT ILLNESS:  [name] is a 45 year old female presenting with joint pain x1 day.  She states she started methotrexate 1 week ago.  After seeing her rheumatologist on Tuesday she took her second dose.  Yesterday, she began to have increased pain in her joints that worsened today and brought her to the hospital.  She notes URI including cough with some yellow sputum, but denies fever and chills. She states she has actually begun to feel better from this over the last few days.  Chest x-ray in the ED was concerning for bilateral pneumonia. Considering comorbidities, the decision was made to admit for pain management and pneumonia treatment.  PAST MEDICAL HISTORY: 1. Lupus. 2. Obesity. 3. Thrombotic vasculitis. 4. Peripheral neuropathy. 5. Tobacco abuse. 6. Hyperglycemia.  PAST SURGICAL HISTORY:  None.  SOCIAL HISTORY:  Lives with her mother, currently on disability due to vasculitis.  FAMILY HISTORY:  An aunt had breast cancer, otherwise noncontributory.  ALLERGIES:  PENICILLIN, rash and hives as a child.  CURRENT OUTPATIENT MEDICATIONS: 1. Calcium and vitamin D 600 and 400 one tablet 2 times a day. 2. Gabapentin 300 mg 3 tablets three times a day. 3. Plaquenil 1 tablet twice a day. 4. Metformin 500 mg daily. 5. Mobic 15 mg daily. 6. Prilosec 40 mg p.o. daily. 7. Prednisone 7.5 mg p.o. daily. 8. Folic acid 1 mg p.o. daily. 9. Methotrexate every week. 10.Tramadol 50 mg p.r.n. pain.  REVIEW OF SYSTEMS:  Per HPI with the  following additions, has not had TB screen test in several years.  Otherwise, 12-point review of systems was reviewed and was negative.  PHYSICAL EXAM:  VITAL SIGNS:  Pulse 100, blood pressure 125/79, respirations 18, O2 sat 96% on room air, temperature 98.6. GENERAL:  She is alert, cooperative, mild distress, acute and moderately obese. HEENT:  Pupils are equal, round, and reactive to light.  Extraocular movements intact.  Trachea midline. HEART:  S1 and S2 were normal.  No murmurs, rubs, or gallop.  Regular rate and rhythm. LUNGS:  Clear to auscultation.  Unlabored breathing. ABDOMEN:  Soft without significant tenderness, masses, organomegaly or guarding. EXTREMITIES: Tender to palpation in bilateral knees and right wrist. The patient states that her wrist is swollen and on exam it is hot.  I do not see any effusion in the joint however.  Limited range of motion in the wrist.  Full range of motion in the knees. SKIN:  Some Q-waves noted on legs. NEUROLOGY:  Normal without focal findings.  Mental status and speech were normal.  Alert and oriented x3.  Pupils equal, reactive to light. Muscle tone and strength are  normal.  Symmetric and sensation grossly normal.  LABS AND IMAGING:  Labs:  BMET significant for creatinine of 0.76, glucose of 158.  CBC showed a white count of 11.2 with a left shift of 81%, hemoglobin 10.2 and platelets 328.  Chest x-ray showed bilateral upper lobe infiltrates likely infectious and bronchitic changes.  ASSESSMENT AND PLAN:  [name] is a 45 year old female presenting with painful joints secondary to lupus and chest x-ray concerning for bilateral pneumonia. 1. Pneumonia.  This patient has a penicillin allergy.  We will treat     with Avelox 400 mg p.o. daily, currently on no O2, not in distress     with a mild elevation in her white count and per patient, her     symptoms are resolving spontaneously at home.  We will watch     overnight to ensure  that it does not progress given recent     methotrexate.  We will consider placing a PPD to ensure this is not     reactivated TB if does not quickly resolve. 2. Lupus, flare-up pain in joints.  Treat with 1 dose of Toradol.  May     decide to continue NSAIDs if not contraindicated with methotrexate.     May need to call her rheumatologist in the a.m. if pain is not     controlled. 3. Diabetes, only on metformin at home.  Started sliding scale insulin     while in the hospital. 4. Fluids, electrolytes, nutrition, gastrointestinal.  Heart healthy     diet.  Half normal saline at 125 mL per hour. 5. Prophylaxis, heparin 5000 units t.i.d.  Protonix. 6. Disposition.  Pending improvement in joint pain and stable     respiratory status.     [name], MD   ______________________________ Dr. Sheffield Slider    RS/MEDQ  D:  10/08/2010  T:  10/08/2010  Job:  [id number]  Electronically Signed by Zachery Dauer M.D. on 10/28/2010 09:15:25 PM Electronically Signed by [name]  on 11/03/2010 11:44:21 AM

## 2011-01-20 LAB — POCT PREGNANCY, URINE: Operator id: 297281

## 2011-01-28 LAB — GLUCOSE, CAPILLARY: Glucose-Capillary: 96 mg/dL (ref 70–99)

## 2011-03-11 ENCOUNTER — Ambulatory Visit: Payer: Medicare Other | Admitting: Family Medicine

## 2011-03-16 ENCOUNTER — Ambulatory Visit: Payer: Medicare Other | Admitting: Family Medicine

## 2011-06-03 ENCOUNTER — Ambulatory Visit: Payer: Medicare Other | Admitting: Family Medicine

## 2011-06-06 ENCOUNTER — Encounter: Payer: Self-pay | Admitting: Family Medicine

## 2011-06-06 ENCOUNTER — Ambulatory Visit (INDEPENDENT_AMBULATORY_CARE_PROVIDER_SITE_OTHER): Payer: Medicare Other | Admitting: Family Medicine

## 2011-06-06 DIAGNOSIS — B351 Tinea unguium: Secondary | ICD-10-CM | POA: Diagnosis not present

## 2011-06-06 DIAGNOSIS — L930 Discoid lupus erythematosus: Secondary | ICD-10-CM

## 2011-06-06 LAB — POCT SKIN KOH: Skin KOH, POC: NEGATIVE

## 2011-06-06 MED ORDER — TERBINAFINE HCL 250 MG PO TABS: 250.0000 mg | ORAL_TABLET | Freq: Every day | ORAL | Status: AC

## 2011-06-06 NOTE — Patient Instructions (Addendum)
If your lab results are normal, I will send you a letter with the results. If abnormal, someone at the clinic will get in touch with you.   Take the oral medication (terbinafine) for suspect toe nail fungus. Follow-up in 4-6 weeks.   Call and give the fax number to Dr. Hippe's office so we can send results.  We will refer you to an eye doctor. Let me know if you don't hear back from [place] in 1 week.   It was nice to see you today!  Follow-up at your convenience for the Pap smear and to discuss management of your fibroids.

## 2011-06-06 NOTE — Assessment & Plan Note (Signed)
Looks consistent with subungual onychomycosis and faint non-specific plantar foot rash on right foot. However, negative KOH prep. Checking baseline CMET today and starting terbinafine x 12 weeks. Follow-up in 4-6 weeks prn.

## 2011-06-06 NOTE — Assessment & Plan Note (Signed)
Patient doing well without recent flares on current regimen.   Followed by Dr. Elizabeth Sauer at [facility].  Said if okay with Dr. Elizabeth Sauer, okay to get monitoring labs here and will fax to Dr. Elizabeth Sauer.  Checking CBC and CMET today since CMET needed to start terbinafine.   Referring to opthalmologist due to long-term Plaquenil use.

## 2011-06-06 NOTE — Progress Notes (Signed)
  Subjective:    Patient ID: [name], female    DOB: [birth year], 46 y.o.   MRN: [id number]  HPI Follow-up.  1. Lupus No flares recently Pain under control Followed by rheumatology at [facility] who prescribes medications Patient asking if blood work can be checked at our clinic  Recently re-started methotrexate   2. Requesting opthalmology referral since on methotrexate  3. Right foot with cracked toe nails since starting methotrexate and with rash Does not hurt or itch Notices discoloration and frailty of toe nails (but only on right foot) associated with taking methotrexate since improves when off this medication Wondering if she has fungus  Review of Systems Per HPI    Objective:   Physical Exam Gen: NAD Psych: appropriate, pleasant Neuro: able to walk to bathroom without problem CV: RRR Pulm: CTAB without w/r/r MSK: no joint swelling, erythema, warmth or tenderness in hands/arms, ankles/knees Ext: no edema Skin:   RF: toe nails cracked with linear line down first toe nail and cracked and darkened 4th and 5th toe nails; medial plantar aspect with hyperpigmented, palpable, punctate scattered few lesions; no erythema, warmth, scales    Assessment & Plan:

## 2011-06-07 ENCOUNTER — Encounter: Payer: Self-pay | Admitting: Family Medicine

## 2011-06-07 LAB — CBC WITH DIFFERENTIAL/PLATELET
Basophils Relative: 0 % (ref 0–1)
Eosinophils Absolute: 0.1 10*3/uL (ref 0.0–0.7)
HCT: 36.9 % (ref 36.0–46.0)
Hemoglobin: 10.1 g/dL — ABNORMAL LOW (ref 12.0–15.0)
Lymphs Abs: 0.9 10*3/uL (ref 0.7–4.0)
MCH: 17.6 pg — ABNORMAL LOW (ref 26.0–34.0)
MCHC: 27.4 g/dL — ABNORMAL LOW (ref 30.0–36.0)
MCV: 64.2 fL — ABNORMAL LOW (ref 78.0–100.0)
Monocytes Absolute: 0.5 10*3/uL (ref 0.1–1.0)
Monocytes Relative: 5 % (ref 3–12)
RBC: 5.75 MIL/uL — ABNORMAL HIGH (ref 3.87–5.11)

## 2011-06-07 LAB — COMPREHENSIVE METABOLIC PANEL
ALT: <8 U/L (ref 0–35)
AST: 13 U/L (ref 0–37)
Calcium: 9.1 mg/dL (ref 8.4–10.5)
Chloride: 105 mEq/L (ref 96–112)
Creat: 0.85 mg/dL (ref 0.50–1.10)
Potassium: 4.4 mEq/L (ref 3.5–5.3)
Sodium: 137 mEq/L (ref 135–145)

## 2011-06-10 ENCOUNTER — Telehealth: Payer: Self-pay | Admitting: Family Medicine

## 2011-06-10 ENCOUNTER — Ambulatory Visit (INDEPENDENT_AMBULATORY_CARE_PROVIDER_SITE_OTHER): Payer: Medicare Other | Admitting: Family Medicine

## 2011-06-10 ENCOUNTER — Encounter: Payer: Self-pay | Admitting: Family Medicine

## 2011-06-10 ENCOUNTER — Ambulatory Visit
Admission: RE | Admit: 2011-06-10 | Discharge: 2011-06-10 | Disposition: A | Discharge status: Home / Self Care | Payer: Medicare Other | Source: Ambulatory Visit | Attending: Family Medicine | Admitting: Family Medicine

## 2011-06-10 VITALS — BP 136/83 | Temp 98.1°F | Ht 63.0 in | Wt 241.0 lb

## 2011-06-10 DIAGNOSIS — M79661 Pain in right lower leg: Secondary | ICD-10-CM | POA: Insufficient documentation

## 2011-06-10 DIAGNOSIS — M79609 Pain in unspecified limb: Secondary | ICD-10-CM | POA: Diagnosis not present

## 2011-06-10 MED ORDER — KETOROLAC TROMETHAMINE 30 MG/ML IJ SOLN
30.0000 mg | Freq: Once | INTRAMUSCULAR | Status: AC
  Administered 2011-06-10: 30 mg via INTRAMUSCULAR

## 2011-06-10 MED ORDER — NAPROXEN 500 MG PO TABS: 500.0000 mg | ORAL_TABLET | Freq: Two times a day (BID) | ORAL | Status: DC

## 2011-06-10 NOTE — Patient Instructions (Signed)
It was a pleasure to see you today.  I believe the pain in your right shin is muscular in origin.  We gave you a shot of ketorolac (Toradol) 30mg  today in the office.   I would like you to use Naproxen 500mg  every 12 hours with food, as needed for the pain, for the next 2 to 3 days.   I am ordering an x-ray of the long bones of the right leg; I will call your cell [phone] when the x-ray is back.

## 2011-06-10 NOTE — Telephone Encounter (Signed)
Will forward to Dr.Breen as he saw her today. Soma Lizak, Maryjo Rochester

## 2011-06-10 NOTE — Telephone Encounter (Signed)
Patient is returning a call but did not know who it was that called her and I did not see notes as to who called her.

## 2011-06-10 NOTE — Progress Notes (Signed)
  Subjective:    Patient ID: [name], female    DOB: 1966-02-15, 46 y.o.   MRN: [id number]  HPI Patient seen for acute visit for recent onset R shin pain, which began gradually about 1 week ago.  FOcal over the lateral aspect of the shin, does not involve the knee or the ankle.  Patient with SLE seen at [facility], is taking plaquenil for this.  She denies fevers or chills, states taht her usual SLE-associated joint pain usually involves knees and elbows.  Last noc the pain was so intense she almost went to ED.   Worse when standing, or shifting position.    Review of Systems See HPI    Objective:   Physical Exam Alert, in some mild distress with movement in chair.   MSK/NEURO: Tenderness without asymmetry along the R lateral shin; no fluctuance or erythema, no calor.  Patient allows me to passively dorsiflex/plantarflex R ankle, but notes pain with active dorsiflexion against resistance.  Strength grossly intact. DP pulses present in both feet.  Full ROM knees passively and actively.        Assessment & Plan:

## 2011-06-10 NOTE — Assessment & Plan Note (Signed)
R shin pain, lateral, involves dorsiflexion, appears clearly muscular in nature. No popliteal pain, no cords or calf swelling/erythema.  Patient given IM Toradol during this visit.  She is sent for x-ray to exclude bony etiology.   Addendum: I reviewed x-ray report, negative for bony abnormality.  I called patient and left voice mail to this effect, gave call-back number for questions.

## 2011-06-13 DIAGNOSIS — M329 Systemic lupus erythematosus, unspecified: Secondary | ICD-10-CM | POA: Diagnosis not present

## 2011-06-13 NOTE — Telephone Encounter (Signed)
I returned call to patient, to report negative findings on her leg x-ray from Friday (no abnormal findings).  I left another voice mail. If she calls back, this is the reason for my call, please relay message.  Thank you.  Paula Compton, M.D.

## 2011-06-17 ENCOUNTER — Encounter: Payer: Self-pay | Admitting: Family Medicine

## 2011-06-17 DIAGNOSIS — R7309 Other abnormal glucose: Secondary | ICD-10-CM

## 2011-06-17 NOTE — Assessment & Plan Note (Signed)
Documentation only. Received notification from Rheumatologist UA showed 4+ glucose. Patient may need to be re-started on metformin.

## 2011-07-24 ENCOUNTER — Encounter: Payer: Self-pay | Admitting: Family Medicine

## 2011-07-24 DIAGNOSIS — L930 Discoid lupus erythematosus: Secondary | ICD-10-CM

## 2011-07-24 NOTE — Assessment & Plan Note (Signed)
Documentation only. Seen by [facility] Rhe[facility] 02/18. MTX increased to 15 weekly 2/2 persistent joint pain in PIP/wrists, decrease prednisone 1mg  q2wks, continue plaquenil, ibuprofen prn.

## 2011-07-29 ENCOUNTER — Ambulatory Visit (INDEPENDENT_AMBULATORY_CARE_PROVIDER_SITE_OTHER): Payer: Medicare Other | Admitting: Family Medicine

## 2011-07-29 ENCOUNTER — Encounter: Payer: Self-pay | Admitting: Family Medicine

## 2011-07-29 VITALS — BP 143/88 | HR 90 | Temp 98.4°F | Ht 63.0 in | Wt 236.0 lb

## 2011-07-29 DIAGNOSIS — L930 Discoid lupus erythematosus: Secondary | ICD-10-CM | POA: Diagnosis not present

## 2011-07-29 DIAGNOSIS — M129 Arthropathy, unspecified: Secondary | ICD-10-CM | POA: Diagnosis not present

## 2011-07-29 DIAGNOSIS — G5700 Lesion of sciatic nerve, unspecified lower limb: Secondary | ICD-10-CM

## 2011-07-29 DIAGNOSIS — R7309 Other abnormal glucose: Secondary | ICD-10-CM | POA: Diagnosis not present

## 2011-07-29 LAB — COMPREHENSIVE METABOLIC PANEL
ALT: 8 U/L (ref 0–35)
CO2: 22 mEq/L (ref 19–32)
Calcium: 8.9 mg/dL (ref 8.4–10.5)
Chloride: 107 mEq/L (ref 96–112)
Creat: 0.82 mg/dL (ref 0.50–1.10)
Glucose, Bld: 122 mg/dL — ABNORMAL HIGH (ref 70–99)
Total Bilirubin: 0.2 mg/dL — ABNORMAL LOW (ref 0.3–1.2)
Total Protein: 6.8 g/dL (ref 6.0–8.3)

## 2011-07-29 MED ORDER — LORAZEPAM 1 MG PO TABS: 1.0000 mg | ORAL_TABLET | Freq: Every evening | ORAL | Status: DC | PRN

## 2011-07-29 MED ORDER — PREDNISONE 20 MG PO TABS: 60.0000 mg | ORAL_TABLET | Freq: Every day | ORAL | Status: DC

## 2011-07-29 MED ORDER — OXYCODONE-ACETAMINOPHEN 5-325 MG PO TABS: 1.0000 | ORAL_TABLET | Freq: Three times a day (TID) | ORAL | Status: DC | PRN

## 2011-07-29 NOTE — Patient Instructions (Signed)
For your sciatica: -Try the Ativan at bedtime as needed. -Try the percocet as needed during the day. DO NOT TAKE ATIVAN AND PERCOCET TOGETHER.  -Take prednisone 60mg  for 5 days.  Follow-up with me in 1 week.

## 2011-07-29 NOTE — Assessment & Plan Note (Signed)
Documentation only. Rheumatologist concerned about glucose in recent urinalysis. However, patient checks blood sugars at home, and has been ranging 110-140s. Anticipate will go up since on prednisone burst for next 5 days. However, for Lupus, patient is weaning down on chronic prednisone. She is currently in 7mg  daily.

## 2011-07-29 NOTE — Assessment & Plan Note (Signed)
Previously on right-side when diagnosed initially 05/2010, but now symptoms consistent with sciatica on the left.  Will treat with prednisone burst, Ativan at nighttime to help relax muscles, and percocet prn.  Follow-up in 1 week to re-evaluation. Consider MRI L-spine to confirm diagnosis at that time if patient more comfortable.

## 2011-07-29 NOTE — Progress Notes (Signed)
  Subjective:    Patient ID: [name], female    DOB: November 12, 1965, 46 y.o.   MRN: [id number]  HPI 1. Acute visit for sciatica flare. Diagnosed with sciatica based on history/physical exam about 1 year here at clinic. She never had imaging done.  She underwent physical therapy for about 6 weeks and her symptoms improved. She is here today due to exacerbation of sciatic pain, however, instead of being on right side like it was previously, it is now on her left. Patient reports pain from left back to calf associated with tingling/decreased sensation. Denies changes in bladder/bowel function or numbness.  Started several days ago.  Exacerbated by: sitting/laying Alleviated by: standing up Medications tried: tried gabapentin previously and did not like it (dizziness/made her feel loopy)  2. Patient requesting labs be drawn from rheumatologist here today.   Review of Systems Per HPI    Objective:   Physical Exam Gen: uncomfortable, standing up; accompanied by boyfriend Psych: appropriate to questions; calm, not anxious-appearing MSK:    Back: tenderness to palpation over left lumbar spine Neuro:    Gait: limps on left-side     Sensation intact throughout   Strength 4-5 bilateral lower extremities   Maneuvers:     Straight-leg raises: positive on left; pain not elicited on left side with right-leg raises    Assessment & Plan:

## 2011-07-29 NOTE — Assessment & Plan Note (Signed)
Documentation only. Will get labs here and send to Dr. Elizabeth Sauer (fax: [phone]).

## 2011-07-30 LAB — CBC
HCT: 38.5 % (ref 36.0–46.0)
MCV: 68.8 fL — ABNORMAL LOW (ref 78.0–100.0)
Platelets: 375 10*3/uL (ref 150–400)
RBC: 5.6 MIL/uL — ABNORMAL HIGH (ref 3.87–5.11)
RDW: 20.3 % — ABNORMAL HIGH (ref 11.5–15.5)
WBC: 8.7 10*3/uL (ref 4.0–10.5)

## 2011-08-01 ENCOUNTER — Encounter: Payer: Self-pay | Admitting: Family Medicine

## 2011-08-05 ENCOUNTER — Ambulatory Visit (INDEPENDENT_AMBULATORY_CARE_PROVIDER_SITE_OTHER): Payer: Medicare Other | Admitting: Family Medicine

## 2011-08-05 ENCOUNTER — Encounter: Payer: Self-pay | Admitting: Family Medicine

## 2011-08-05 VITALS — BP 150/80 | Temp 98.9°F | Ht 63.0 in | Wt 236.0 lb

## 2011-08-05 DIAGNOSIS — G5700 Lesion of sciatic nerve, unspecified lower limb: Secondary | ICD-10-CM

## 2011-08-05 DIAGNOSIS — M25559 Pain in unspecified hip: Secondary | ICD-10-CM

## 2011-08-05 DIAGNOSIS — M25552 Pain in left hip: Secondary | ICD-10-CM

## 2011-08-05 MED ORDER — OXYCODONE-ACETAMINOPHEN 5-325 MG PO TABS: 1.0000 | ORAL_TABLET | Freq: Three times a day (TID) | ORAL | Status: AC | PRN

## 2011-08-05 MED ORDER — LORAZEPAM 1 MG PO TABS: 1.0000 mg | ORAL_TABLET | Freq: Every evening | ORAL | Status: AC | PRN

## 2011-08-05 NOTE — Progress Notes (Signed)
  Subjective:    Patient ID: [name], female    DOB: [birth year], 46 y.o.   MRN: [id number]  HPI Follow-up: left back/hip pain  Patient seen here 1 week ago, last Friday. Started prednisone, Ativan and percocet prn. Patient reports feeling very good Saturday and was more active than usual (shopping) that day. However, on Sunday, she started having the same significant pain in her left back/hip radiating behind her left thigh to her calf.  The patient reports her symptoms are not are severe but are still a significant, dull pain.  Medications tried:    Ativan: taking 1-1.5 tablets daily. Helps the most.   Percocet: taking 2-3 tablets daily. Helps but only lasts 30 minutes.    Prednisone: finished 5-day burst Exacerbated by: sitting up. Laying and standing help the pain.  Alleviated by: standing up hurts the least  Review of Systems Per HPI with following: Denies fevers/chills    Objective:   Physical Exam Gen: standing up when I entered room; appears mildly uncomfortable MSK:   Back: mild-moderate TTP palpation of left lumbar musculature; no redness, swelling, warmth   Right hip: no tenderness, no swelling, ROM intact, pain does not radiate to left side with right straight leg raises   Left hip:     Significant TTP of trochanter; no swelling, erythema, or warmth     ROM: limited due to pain with flexion and internal rotation of hip     Positive sitting and standing straight-leg-raises    Assessment & Plan:

## 2011-08-05 NOTE — Assessment & Plan Note (Addendum)
Patient's pain seems more pronounced in her hip than her back today.  I saw her last week and attributed her pain to sciatic pain, which she has had before about a year (but on her right side), and which responded well to conservative treatment (Neurontin and stretching).  I think her pain may be improved after treatment for sciatica with prednisone and prn Ativan/percocet, but it is still significant.  Currently differential includes lumbar nerve impingement/muscle spasm, fracture/avascular necrosis due to her history of lupus and being on chronic prednisone, bursitis  Will get MRI of lumbar spine and pelvis to help narrow differential>>>Scheduled for next week. In the meantime, may continue Ativan and percocet, which are helping. Also, should avoid NSAID with methotrexate (Risk D). Continue stretches (for sciatica and possible bursitis).

## 2011-08-05 NOTE — Patient Instructions (Addendum)
We will schedule you for an MRI of your hip and spine. I will call you with the results.   Try applying warm compresses to your thigh.  Try alternating percocet and Tramadol for your pain.  Continue the Ativan as needed.

## 2011-08-11 DIAGNOSIS — A1850 Tuberculosis of eye, unspecified: Secondary | ICD-10-CM | POA: Diagnosis not present

## 2011-08-11 DIAGNOSIS — Z79899 Other long term (current) drug therapy: Secondary | ICD-10-CM | POA: Diagnosis not present

## 2011-08-12 ENCOUNTER — Other Ambulatory Visit: Payer: Self-pay | Admitting: Family Medicine

## 2011-08-12 ENCOUNTER — Ambulatory Visit (HOSPITAL_COMMUNITY)
Admission: RE | Admit: 2011-08-12 | Discharge: 2011-08-12 | Disposition: A | Discharge status: Home / Self Care | Payer: Medicare Other | Source: Ambulatory Visit | Attending: Family Medicine | Admitting: Family Medicine

## 2011-08-12 DIAGNOSIS — M5126 Other intervertebral disc displacement, lumbar region: Secondary | ICD-10-CM | POA: Diagnosis not present

## 2011-08-12 DIAGNOSIS — M25552 Pain in left hip: Secondary | ICD-10-CM

## 2011-08-12 DIAGNOSIS — D259 Leiomyoma of uterus, unspecified: Secondary | ICD-10-CM | POA: Diagnosis not present

## 2011-08-12 DIAGNOSIS — M47817 Spondylosis without myelopathy or radiculopathy, lumbosacral region: Secondary | ICD-10-CM | POA: Diagnosis not present

## 2011-08-12 MED ORDER — GADOBENATE DIMEGLUMINE 529 MG/ML IV SOLN
20.0000 mL | Freq: Once | INTRAVENOUS | Status: AC
  Administered 2011-08-12: 20 mL via INTRAVENOUS

## 2011-08-15 ENCOUNTER — Telehealth: Payer: Self-pay | Admitting: Family Medicine

## 2011-08-15 DIAGNOSIS — M25552 Pain in left hip: Secondary | ICD-10-CM

## 2011-08-15 MED ORDER — GABAPENTIN 300 MG PO CAPS: 300.0000 mg | ORAL_CAPSULE | Freq: Three times a day (TID) | ORAL | Status: DC

## 2011-08-15 NOTE — Telephone Encounter (Signed)
Message copied by [facility], Etta Quill on Mon Aug 15, 2011  1:23 PM ------      Message from: Pound, Estill Batten      Created: Sun Aug 14, 2011 11:09 AM                   ----- Message -----         From: Rad Results In Interface         Sent: 08/13/2011   8:37 AM           To: Carney Living, MD

## 2011-08-15 NOTE — Telephone Encounter (Signed)
Discussed MRI results with patient showing herniated disc with S1 impingement on left.   Patient still uncomfortable despite Ativan (which seems help the most) and percocet. Start gabapentin. 300mg  qhs, titrate up as needed.   Will pick up Rx Ativan and percocet Wednesday.  Follow-up 1-2 weeks.

## 2011-08-17 MED ORDER — OXYCODONE-ACETAMINOPHEN 5-325 MG PO TABS: 1.0000 | ORAL_TABLET | Freq: Three times a day (TID) | ORAL | Status: DC | PRN

## 2011-08-17 MED ORDER — LORAZEPAM 1 MG PO TABS: 1.0000 mg | ORAL_TABLET | Freq: Every evening | ORAL | Status: DC | PRN

## 2011-08-17 NOTE — Telephone Encounter (Signed)
Rx percocet and ativan put in front office.

## 2011-09-05 ENCOUNTER — Ambulatory Visit: Payer: Medicare Other | Admitting: Family Medicine

## 2011-10-10 ENCOUNTER — Encounter: Payer: Self-pay | Admitting: Family Medicine

## 2011-10-10 ENCOUNTER — Ambulatory Visit (INDEPENDENT_AMBULATORY_CARE_PROVIDER_SITE_OTHER): Payer: Medicare Other | Admitting: Family Medicine

## 2011-10-10 VITALS — BP 130/80 | HR 80 | Temp 98.4°F | Ht 63.0 in | Wt 236.0 lb

## 2011-10-10 DIAGNOSIS — E669 Obesity, unspecified: Secondary | ICD-10-CM | POA: Diagnosis not present

## 2011-10-10 DIAGNOSIS — G5700 Lesion of sciatic nerve, unspecified lower limb: Secondary | ICD-10-CM | POA: Diagnosis not present

## 2011-10-10 DIAGNOSIS — G609 Hereditary and idiopathic neuropathy, unspecified: Secondary | ICD-10-CM

## 2011-10-10 DIAGNOSIS — M129 Arthropathy, unspecified: Secondary | ICD-10-CM

## 2011-10-10 DIAGNOSIS — M25552 Pain in left hip: Secondary | ICD-10-CM

## 2011-10-10 DIAGNOSIS — M25559 Pain in unspecified hip: Secondary | ICD-10-CM

## 2011-10-10 MED ORDER — TRAMADOL HCL 50 MG PO TABS: 50.0000 mg | ORAL_TABLET | Freq: Four times a day (QID) | ORAL | Status: DC | PRN

## 2011-10-10 MED ORDER — GABAPENTIN 300 MG PO CAPS: ORAL_CAPSULE | ORAL | Status: DC

## 2011-10-10 MED ORDER — CYCLOBENZAPRINE HCL 10 MG PO TABS: 10.0000 mg | ORAL_TABLET | Freq: Three times a day (TID) | ORAL | Status: AC | PRN

## 2011-10-10 MED ORDER — PREDNISONE 1 MG PO TABS: 1.0000 mg | ORAL_TABLET | Freq: Every day | ORAL | Status: DC

## 2011-10-10 NOTE — Progress Notes (Signed)
  Subjective:    Patient ID: [name], female    DOB: Sep 19, 1965, 46 y.o.   MRN: [id number]  HPI Acute visit: worsening of right-sided back/hip/leg pain.  Patient has history of L5-S1 disc herniation encroaching on left S1 root. She had been seen in April most recently for this. The pain improved on this side to the point where she did not need the gabapentin for a few weeks.  Then a few weeks ago, she started having an achey, constant pain on her right side.  It does not radiate.  Denies tingling/numbness, bowel/bladder dysfunction.  Gabapentin 1-3 tablets tid helps but makes her sleepy.  Alleviated by: gabapentin Exacerbated by: laying down; worse at night but no difficulty sleeping because gabapentin helps her to go to sleep  Recently, she was weaned to prednisone 1 mg.   Review of Systems Per HPI.  Past Medical History, Family History, Social History, Allergies, and Medications reviewed. Significant for lupus.    Objective:   Physical Exam Gen: NAD; overweight PSYCH: pleasant, engaged, appropriate to questions, alert and oriented PULM: NI WOB ABD: soft, NT, obese  BACK Range of motion at  the waist: Flexion 160, extension 45  No echymosis or edema Rises to examination table with mild difficulty  Gait: minimally antalgic  Inspection/Deformity: N Paraspinus Tenderness: none but acute tenderness palpation of left buttock area (adductors)  Straight-leg-raise: negative sitting, ?positive right-side at about 90 deg laying  Mild-moderate TTP over right trocanteric bursa region  B Ankle Dorsiflexion (L5,4): 5/5 B Great Toe Dorsiflexion (L5,4): 5/5 Heel Walk (L5): WNL Toe Walk (S1): WNL Rise/Squat (L4): WNL, mild pain  SENSORY B Medial Foot (L4): WNL B Dorsum (L5): WNL B Lateral (S1): WNL Light Touch: WNL    Assessment & Plan:

## 2011-10-10 NOTE — Assessment & Plan Note (Signed)
Piriformis syndrome now on right-side.  Will avoid prednisone at this time. However her tapering off the prednisone may be contributing. She has underlying L5-S1 disc herniation encroaching on left S1. She may have been compensating and now with muscle spasms/piriformis syndrome on contralateral side.  Discussed options.  Patient would like to try medication management with muscle relaxants and Tramadol, warm compresses, and stretches.  Follow-up next week. Re-evaluate trocanteric tenderness and consider injection versus referral to orthopedics for management (epidural injection).

## 2011-10-10 NOTE — Patient Instructions (Addendum)
For your back pain: -Try flexeril (muscle relaxant) up to three times a day. -You may continue gabapentin. -Use Tramadol as needed for breakthrough pain. May take up to 4 x a day. Prescription sent to pharmacy.   Stretch  Use warm compresses to help loosen muscles  Follow-up in 1 week.

## 2011-10-21 ENCOUNTER — Ambulatory Visit: Payer: Medicare Other | Admitting: Family Medicine

## 2011-11-21 DIAGNOSIS — M129 Arthropathy, unspecified: Secondary | ICD-10-CM | POA: Diagnosis not present

## 2011-11-21 DIAGNOSIS — R05 Cough: Secondary | ICD-10-CM | POA: Diagnosis not present

## 2011-11-21 DIAGNOSIS — R5381 Other malaise: Secondary | ICD-10-CM | POA: Diagnosis not present

## 2011-11-21 DIAGNOSIS — Z79899 Other long term (current) drug therapy: Secondary | ICD-10-CM | POA: Diagnosis not present

## 2011-11-21 DIAGNOSIS — R6883 Chills (without fever): Secondary | ICD-10-CM | POA: Diagnosis not present

## 2011-11-21 DIAGNOSIS — M329 Systemic lupus erythematosus, unspecified: Secondary | ICD-10-CM | POA: Diagnosis not present

## 2011-11-21 DIAGNOSIS — R062 Wheezing: Secondary | ICD-10-CM | POA: Diagnosis not present

## 2011-11-21 DIAGNOSIS — I999 Unspecified disorder of circulatory system: Secondary | ICD-10-CM | POA: Diagnosis not present

## 2011-11-21 DIAGNOSIS — R059 Cough, unspecified: Secondary | ICD-10-CM | POA: Diagnosis not present

## 2011-11-21 DIAGNOSIS — L659 Nonscarring hair loss, unspecified: Secondary | ICD-10-CM | POA: Diagnosis not present

## 2012-02-27 DIAGNOSIS — Z79899 Other long term (current) drug therapy: Secondary | ICD-10-CM | POA: Diagnosis not present

## 2012-02-27 DIAGNOSIS — M329 Systemic lupus erythematosus, unspecified: Secondary | ICD-10-CM | POA: Diagnosis not present

## 2012-02-27 DIAGNOSIS — R059 Cough, unspecified: Secondary | ICD-10-CM | POA: Diagnosis not present

## 2012-02-27 DIAGNOSIS — R05 Cough: Secondary | ICD-10-CM | POA: Diagnosis not present

## 2012-03-04 ENCOUNTER — Encounter: Payer: Self-pay | Admitting: Family Medicine

## 2012-03-04 NOTE — Progress Notes (Signed)
From rheumatologist.  Persistent productive cough when seen 11/04. Continue to hold methotrexate (already held 2 weeks). Starting azithromycin x 5 days and checking CXR.   When recovered, restart MTX 20 mg po week. Contiue Plaquenil.   Checking CBC, LFT, Cr at the visit.

## 2012-06-11 DIAGNOSIS — M129 Arthropathy, unspecified: Secondary | ICD-10-CM | POA: Diagnosis not present

## 2012-06-11 DIAGNOSIS — IMO0002 Reserved for concepts with insufficient information to code with codable children: Secondary | ICD-10-CM | POA: Diagnosis not present

## 2012-06-11 DIAGNOSIS — R21 Rash and other nonspecific skin eruption: Secondary | ICD-10-CM | POA: Diagnosis not present

## 2012-06-11 DIAGNOSIS — M329 Systemic lupus erythematosus, unspecified: Secondary | ICD-10-CM | POA: Diagnosis not present

## 2012-06-11 DIAGNOSIS — Z79899 Other long term (current) drug therapy: Secondary | ICD-10-CM | POA: Diagnosis not present

## 2012-06-11 DIAGNOSIS — I999 Unspecified disorder of circulatory system: Secondary | ICD-10-CM | POA: Diagnosis not present

## 2012-11-12 DIAGNOSIS — F632 Kleptomania: Secondary | ICD-10-CM | POA: Diagnosis not present

## 2012-11-22 DIAGNOSIS — F632 Kleptomania: Secondary | ICD-10-CM | POA: Diagnosis not present

## 2012-11-29 DIAGNOSIS — F632 Kleptomania: Secondary | ICD-10-CM | POA: Diagnosis not present

## 2012-12-14 DIAGNOSIS — F632 Kleptomania: Secondary | ICD-10-CM | POA: Diagnosis not present

## 2012-12-26 DIAGNOSIS — F632 Kleptomania: Secondary | ICD-10-CM | POA: Diagnosis not present

## 2013-01-03 DIAGNOSIS — F632 Kleptomania: Secondary | ICD-10-CM | POA: Diagnosis not present

## 2013-01-10 DIAGNOSIS — F632 Kleptomania: Secondary | ICD-10-CM | POA: Diagnosis not present

## 2013-01-17 DIAGNOSIS — F632 Kleptomania: Secondary | ICD-10-CM | POA: Diagnosis not present

## 2013-01-31 DIAGNOSIS — F632 Kleptomania: Secondary | ICD-10-CM | POA: Diagnosis not present

## 2013-02-15 DIAGNOSIS — F632 Kleptomania: Secondary | ICD-10-CM | POA: Diagnosis not present

## 2013-02-18 DIAGNOSIS — Z23 Encounter for immunization: Secondary | ICD-10-CM | POA: Diagnosis not present

## 2013-02-18 DIAGNOSIS — M1990 Unspecified osteoarthritis, unspecified site: Secondary | ICD-10-CM | POA: Insufficient documentation

## 2013-02-18 DIAGNOSIS — F17200 Nicotine dependence, unspecified, uncomplicated: Secondary | ICD-10-CM | POA: Diagnosis not present

## 2013-02-18 DIAGNOSIS — M329 Systemic lupus erythematosus, unspecified: Secondary | ICD-10-CM | POA: Diagnosis not present

## 2013-02-27 DIAGNOSIS — IMO0002 Reserved for concepts with insufficient information to code with codable children: Secondary | ICD-10-CM | POA: Insufficient documentation

## 2013-02-27 DIAGNOSIS — M9979 Connective tissue and disc stenosis of intervertebral foramina of abdomen and other regions: Secondary | ICD-10-CM | POA: Insufficient documentation

## 2013-02-27 HISTORY — DX: Reserved for concepts with insufficient information to code with codable children: IMO0002

## 2013-02-27 HISTORY — DX: Connective tissue and disc stenosis of intervertebral foramina of abdomen and other regions: M99.79

## 2013-02-28 DIAGNOSIS — F632 Kleptomania: Secondary | ICD-10-CM | POA: Diagnosis not present

## 2013-05-07 ENCOUNTER — Ambulatory Visit (INDEPENDENT_AMBULATORY_CARE_PROVIDER_SITE_OTHER): Payer: Medicare Other | Admitting: Family Medicine

## 2013-05-07 ENCOUNTER — Encounter: Payer: Self-pay | Admitting: Family Medicine

## 2013-05-07 VITALS — BP 157/91 | HR 94 | Temp 98.2°F | Wt 260.0 lb

## 2013-05-07 DIAGNOSIS — E669 Obesity, unspecified: Secondary | ICD-10-CM | POA: Diagnosis not present

## 2013-05-07 DIAGNOSIS — I10 Essential (primary) hypertension: Secondary | ICD-10-CM | POA: Diagnosis not present

## 2013-05-07 MED ORDER — LORAZEPAM 0.5 MG PO TABS: 0.5000 mg | ORAL_TABLET | Freq: Two times a day (BID) | ORAL | Status: DC | PRN

## 2013-05-07 NOTE — Assessment & Plan Note (Addendum)
First elevated BP in office today, discussed with her that she this is not a diagnosis of hypertension but that with age and increased weight it is likely that she should be put on antihypertensive medications. There are several office visits with high BP dating back to 2013/2012. She says she has always had good BP and that she doesn't want to start on BP meds.

## 2013-05-07 NOTE — Progress Notes (Signed)
   Subjective:    Patient ID: [name], female    DOB: 08/14/1965, 48 y.o.   MRN: [id number]  HPI  CC: wants to try belviq for weight loss  # Weight loss - has gained +24lbs since last visit in 2013. - She has tried: eating less, eating lower calorie foods, smaller portions and eating more frequently. - specific changes she has made include eating baked chicken instead of fried food (though has not cut out all fried food), has enjoyed using more olive oil in cooking - only gets about 1 serving of vegetables a day. Doesn't like most vegetables, but does like string beans, broccoli, greens. - identifies problems with diet including sweets being a "nemesis", especially with late night cravings. - foods she enjoys that she identifies as healthier for snacks: unsweetened applesauce, raisins, graham crackers. - has been reading a lot about Belviq and thinks that she would do very well with it - current exercise is limited: walks 6 blocks 3 times a week. She does get short of breath and has pain in joints that limits her walks.  # Anxiety medication for dental procedure - [facility] [employer] [facility] [employer], recommended by initial dental evaluation to get "tranquilizer" from PCP  Review of Systems Weight gain, no change in energy, no change in sleep, no CP, no SOB, no N/V/D, endorses joint pains    Objective:   Physical Exam BP 157/91  Pulse 94  Temp(Src) 98.2 F (36.8 C) (Oral)  Wt 260 lb (117.935 kg)  LMP 03/25/2013  General: NAD HEENT: NCAT, PERRL, EOMI CV: RRR, normal heart sounds Resp: CTAB, normal effort Abd: soft, obese, mild tenderness to palpation of LUQ. No organomegaly Ext: trace edema bilaterally. 2+ PT pulses bilaterally      Assessment & Plan:  See Problem List documentation

## 2013-05-07 NOTE — Patient Instructions (Signed)
It was nice to meet you today.  Weight loss: work on what we discussed today in clinic. It sounds like you are making some good choices with substituting foods that you know are unhealthy (baked instead of fried chicken). You need to work on trying to substitute healthier foods for snacks rather than sweets, especially at night. Start with small goals, such as increasing from 1 serving of vegetable a day to 2.   Dr. Wyona AlmasJeannie Sykes, our nutritionist, will contact you to schedule an appointment.   Hypertension: at this point we can re-evaluate this after a month of trying some diet and exercise strategies. Please keep in mind that I still will probably recommend starting a medication if your blood pressure has not gotten better.

## 2013-05-07 NOTE — Assessment & Plan Note (Signed)
Comes to office requesting Belviq prescription. Discussed dietary changes and exercise as areas to work on and that weight loss medications often aren't effective. She would like to meet with Dr. Gerilyn PilgrimSykes to learn more about dietary changes, will forward information to her.

## 2013-05-16 DIAGNOSIS — F632 Kleptomania: Secondary | ICD-10-CM | POA: Diagnosis not present

## 2013-05-27 DIAGNOSIS — M329 Systemic lupus erythematosus, unspecified: Secondary | ICD-10-CM | POA: Diagnosis not present

## 2013-05-27 DIAGNOSIS — Z79899 Other long term (current) drug therapy: Secondary | ICD-10-CM | POA: Diagnosis not present

## 2013-05-27 DIAGNOSIS — M1990 Unspecified osteoarthritis, unspecified site: Secondary | ICD-10-CM | POA: Diagnosis not present

## 2013-05-27 DIAGNOSIS — Z880 Allergy status to penicillin: Secondary | ICD-10-CM | POA: Diagnosis not present

## 2013-05-29 ENCOUNTER — Telehealth: Payer: Self-pay | Admitting: Family Medicine

## 2013-05-29 DIAGNOSIS — E669 Obesity, unspecified: Secondary | ICD-10-CM

## 2013-05-29 DIAGNOSIS — F632 Kleptomania: Secondary | ICD-10-CM | POA: Diagnosis not present

## 2013-05-29 NOTE — Telephone Encounter (Signed)
Called patient and gave her Dr. Gerilyn PilgrimSykes phone number to call and see if she could be scheduled for a visit. -Greig CastillaAndrew

## 2013-05-29 NOTE — Telephone Encounter (Signed)
Message copied by Nani RavensWIGHT, Janee Ureste M on Wed May 29, 2013  2:44 PM ------      Message from: Linna DarnerSYKES, JEAN C      Created: Fri May 10, 2013  2:16 PM       Greig CastillaAndrew,       The best thing to do is to give her my card (available at nurse's station), and ask her to call me.  And yes, if you really want them to get in, contact me.  My referral list is LONG, and I am scheduling into March now, so if I don't get a prompt, it's not likely I will call someone to schedule.        We'll still need a referral in Epic.  You want to look for "ambulatory nutrition," I think.  Unfortunately, I am not the best person to ask b/c I never refer to myself!      Gwendel Hansonhx,       Jeannie      ----- Message -----         From: Tawni CarnesAndrew Jahayra Mazo, MD         Sent: 05/07/2013   8:25 PM           To: Linna DarnerJean C Sykes, RD            Jonelle SportsHi Jeannie, my patient here was interested in seeing you for advice on diet and weight loss. I haven't referred anyone to you before so I forgot exactly how you like this to be done, I was told to send you a message in epic and that you would try to contact the patient? Thanks!       ------

## 2013-05-29 NOTE — Addendum Note (Signed)
Addended by: Nani RavensWIGHT, Trenia Tennyson M on: 05/29/2013 03:02 PM   Modules accepted: Orders

## 2013-09-19 ENCOUNTER — Other Ambulatory Visit: Payer: Self-pay | Admitting: Family Medicine

## 2013-09-20 ENCOUNTER — Other Ambulatory Visit: Payer: Self-pay | Admitting: Family Medicine

## 2013-09-23 DIAGNOSIS — Z79899 Other long term (current) drug therapy: Secondary | ICD-10-CM | POA: Diagnosis not present

## 2013-09-23 DIAGNOSIS — Z880 Allergy status to penicillin: Secondary | ICD-10-CM | POA: Diagnosis not present

## 2013-09-23 DIAGNOSIS — M20099 Other deformity of finger(s), unspecified finger(s): Secondary | ICD-10-CM | POA: Diagnosis not present

## 2013-09-23 DIAGNOSIS — M1990 Unspecified osteoarthritis, unspecified site: Secondary | ICD-10-CM | POA: Diagnosis not present

## 2013-09-23 DIAGNOSIS — M25579 Pain in unspecified ankle and joints of unspecified foot: Secondary | ICD-10-CM | POA: Diagnosis not present

## 2013-09-23 DIAGNOSIS — M329 Systemic lupus erythematosus, unspecified: Secondary | ICD-10-CM | POA: Diagnosis not present

## 2014-01-07 ENCOUNTER — Ambulatory Visit (INDEPENDENT_AMBULATORY_CARE_PROVIDER_SITE_OTHER): Payer: Medicare Other | Admitting: Family Medicine

## 2014-01-07 ENCOUNTER — Encounter: Payer: Self-pay | Admitting: Family Medicine

## 2014-01-07 VITALS — BP 136/71 | HR 106 | Temp 97.8°F | Wt 237.2 lb

## 2014-01-07 DIAGNOSIS — M79609 Pain in unspecified limb: Secondary | ICD-10-CM | POA: Diagnosis not present

## 2014-01-07 DIAGNOSIS — IMO0002 Reserved for concepts with insufficient information to code with codable children: Secondary | ICD-10-CM

## 2014-01-07 DIAGNOSIS — L03012 Cellulitis of left finger: Secondary | ICD-10-CM

## 2014-01-07 DIAGNOSIS — M79645 Pain in left finger(s): Secondary | ICD-10-CM

## 2014-01-07 MED ORDER — FLUCONAZOLE 150 MG PO TABS: 150.0000 mg | ORAL_TABLET | Freq: Once | ORAL | Status: DC

## 2014-01-07 MED ORDER — DOXYCYCLINE HYCLATE 100 MG PO TABS: 100.0000 mg | ORAL_TABLET | Freq: Two times a day (BID) | ORAL | Status: DC

## 2014-01-07 MED ORDER — IBUPROFEN 800 MG PO TABS: 800.0000 mg | ORAL_TABLET | Freq: Three times a day (TID) | ORAL | Status: DC | PRN

## 2014-01-07 MED ORDER — SULFAMETHOXAZOLE-TMP DS 800-160 MG PO TABS: 1.0000 | ORAL_TABLET | Freq: Two times a day (BID) | ORAL | Status: DC

## 2014-01-07 NOTE — Progress Notes (Signed)
Subjective:    [name] is a 48 y.o. female who presents to [facility] today for 2 issues:  1.  Left middle finger paronychia:  Present for about 10 days.  Did become much more swollen, painful to 9/10 on scale, and with fluctuance by description about 5 days ago.  Improving since then, but still red and painful, about 6/10.  Has not tried anything for relief except OTC ibuprofen, inconsistent relief.  NO fevers or chills.   2.  Left thumb pain:  Present about 2 weeks, slightly more than middle finger.  States at base of thumb, palmar aspect.  NO trauma/injury.  No swelling.  Has started dropping things due to pain.     ROS as above per HPI, otherwise neg.    The following portions of the patient's history were reviewed and updated as appropriate: allergies, current medications, past medical history, family and social history, and problem list. Patient is a nonsmoker.    PMH reviewed.  No past medical history on file. No past surgical history on file.  Medications reviewed. Current Outpatient Prescriptions  Medication Sig Dispense Refill  . Calcium Carbonate-Vitamin D (CALTRATE 600+D) 600-400 MG-UNIT per tablet Take 1 tablet by mouth two times a day per Dr Kellie Simmering while on prednsione       . cyclobenzaprine (FLEXERIL) 10 MG tablet TAKE 1 TABLET BY MOUTH 3 TIMES A DAY AS NEEDED FOR MUSCLE SPASM  90 tablet  2  . gabapentin (NEURONTIN) 300 MG capsule Take 1-3 tablets up to three times a day as needed for back pain.  90 capsule  0  . Hydroxychloroquine Sulfate (PLAQUENIL PO) Take by mouth 2 (two) times daily.        Marland Kitchen LORazepam (ATIVAN) 0.5 MG tablet Take 1 tablet (0.5 mg total) by mouth 2 (two) times daily as needed for anxiety (take 30 minutes before your dental procedure. if you are still feeling anxious then take the second before the procedure.).  2 tablet  0  . methotrexate (RHEUMATREX) 2.5 MG tablet Take 2.5 mg by mouth once a week. Caution:Chemotherapy. Protect from light. UNSURE OF  DOSAGE (prescribed by Dr. Jovita Gamma at [facility])      . predniSONE (DELTASONE) 1 MG tablet Take 1 tablet (1 mg total) by mouth daily.  30 tablet  0  . traMADol (ULTRAM) 50 MG tablet Take 1 tablet (50 mg total) by mouth every 6 (six) hours as needed for pain. For pain  60 tablet  0   No current facility-administered medications for this visit.     Objective:   Physical Exam BP 136/71  Pulse 106  Temp(Src) 97.8 F (36.6 C) (Oral)  Wt 237 lb 3.2 oz (107.593 kg) Gen:  Alert, cooperative patient who appears stated age in no acute distress.  Vital signs reviewed. Ext:  Left 3rd digit with medial paronychia.  No fluctuance.  Does have some redness at base.  TTP directly at this spot, but nowhere else.  Sensation fully intact throughout finger and distal tip.   Left thumb:  No swelling, no redness.  Strength 4/5, Right is 5/5 opposition.  TTP directly at base of thumb.  No tenderness of snuff box or any part of dorsal aspect of thumb.  No thenar atrophy.    No results found for this or any previous visit (from the past 72 hour(s)).  '

## 2014-01-07 NOTE — Patient Instructions (Signed)
Take the yeast infection medicine in 2-3 days.  Then take it again at the end.    Do NOT pick up the Bactrim.  Take the Doxycyline instead twice a day for 10 days.   Take the Ibuprofen up to three times a day for pain relief and inflammation.    Make an appointment for Sports Medicine on your way out.

## 2014-01-08 DIAGNOSIS — M79645 Pain in left finger(s): Secondary | ICD-10-CM | POA: Insufficient documentation

## 2014-01-08 DIAGNOSIS — IMO0002 Reserved for concepts with insufficient information to code with codable children: Secondary | ICD-10-CM | POA: Insufficient documentation

## 2014-01-08 NOTE — Assessment & Plan Note (Signed)
Likely tendonitis/overuse injury.  No red flags. However, I'm not sure exact etiology.  Sending her to Sports Med for eval and recommendations.   Ibuprofen 800 mg for relief.

## 2014-01-08 NOTE — Assessment & Plan Note (Signed)
Nothing to drain currently. Wrote for Bactrim -- but then patient described a blistering reaction she had NOT from a medication but from initial lupus diagnosis.   LIkely low risk of cross-reaction to sulfa drugs -- but to be safe switch to doxy to treat.  Doxy has better strep coverage anyway.   10 days coverage.  FU PRN at that time.

## 2014-02-12 ENCOUNTER — Ambulatory Visit: Payer: Medicare Other | Admitting: Sports Medicine

## 2014-02-17 ENCOUNTER — Encounter: Payer: Self-pay | Admitting: Family Medicine

## 2014-02-17 ENCOUNTER — Ambulatory Visit (INDEPENDENT_AMBULATORY_CARE_PROVIDER_SITE_OTHER): Payer: Medicare Other | Admitting: Family Medicine

## 2014-02-17 VITALS — BP 138/86 | HR 93 | Temp 98.4°F | Wt 232.0 lb

## 2014-02-17 DIAGNOSIS — R300 Dysuria: Secondary | ICD-10-CM

## 2014-02-17 LAB — POCT URINALYSIS DIPSTICK
Bilirubin, UA: NEGATIVE
Glucose, UA: 500
KETONES UA: NEGATIVE
Leukocytes, UA: NEGATIVE
Nitrite, UA: NEGATIVE
Protein, UA: NEGATIVE
UROBILINOGEN UA: 0.2
pH, UA: 6

## 2014-02-17 LAB — POCT UA - MICROSCOPIC ONLY: RBC, urine, microscopic: >20

## 2014-02-17 MED ORDER — FLUCONAZOLE 150 MG PO TABS: 150.0000 mg | ORAL_TABLET | Freq: Once | ORAL | Status: DC

## 2014-02-17 NOTE — Progress Notes (Signed)
Patient ID: [name], female   DOB: 09/04/1965, 48 y.o.   MRN: [id number]  HPI:  Pt presents for a same day appointment to discuss possible yeast infection.  Thinks she may have a yeast infxn. Was treated with doxycycline course for paronychia a few weeks ago. Was treated prophylactically with diflucan to prevent yeast vaginitis. Is now having itching and burning in vulvar area. No dysuria. No fevers. Having vaginal discharge, initially mucousy and now white/creamy/thick. Had UTI a long time ago, is not sure if this is similar. No new back pain or abdominal pain.  ROS: See HPI  PMFSH: lupus, HTN, peripheral neuropathy, recent paronychia  PHYSICAL EXAM: BP 138/86  Pulse 93  Temp(Src) 98.4 F (36.9 C) (Oral)  Wt 232 lb (105.235 kg) Gen: NAD HEENT: NCAT Heart: RRR Lungs: CTAB Abdomen: soft, NTTP Neuro: grossly nonfocal, speech normal GU: exam deferred  ASSESSMENT/PLAN:  1. Yeast vaginitis - typical history with recent risk factor of systemic antibiotic treatment. GU exam deferred given typical nature of the history. Will treat with diflucan 150mg  x1, repeat in 3 days if not improved. Will also check UA today to ensure not UTI.  FOLLOW UP: F/u as needed if symptoms worsen or do not improve.   GrenadaBrittany J. Pollie MeyerMcIntyre, MD [facility]

## 2014-02-17 NOTE — Patient Instructions (Signed)
Return if not better with the yeast infection medicine. Be well, Dr. Pollie MeyerMcIntyre    Monilial Vaginitis Vaginitis in a soreness, swelling and redness (inflammation) of the vagina and vulva. Monilial vaginitis is not a sexually transmitted infection. CAUSES  Yeast vaginitis is caused by yeast (candida) that is normally found in your vagina. With a yeast infection, the candida has overgrown in number to a point that upsets the chemical balance. SYMPTOMS   White, thick vaginal discharge.  Swelling, itching, redness and irritation of the vagina and possibly the lips of the vagina (vulva).  Burning or painful urination.  Painful intercourse. DIAGNOSIS  Things that may contribute to monilial vaginitis are:  Postmenopausal and virginal states.  Pregnancy.  Infections.  Being tired, sick or stressed, especially if you had monilial vaginitis in the past.  Diabetes. Good control will help lower the chance.  Birth control pills.  Tight fitting garments.  Using bubble bath, feminine sprays, douches or deodorant tampons.  Taking certain medications that kill germs (antibiotics).  Sporadic recurrence can occur if you become ill. TREATMENT  Your caregiver will give you medication.  There are several kinds of anti monilial vaginal creams and suppositories specific for monilial vaginitis. For recurrent yeast infections, use a suppository or cream in the vagina 2 times a week, or as directed.  Anti-monilial or steroid cream for the itching or irritation of the vulva may also be used. Get your caregiver's permission.  Painting the vagina with methylene blue solution may help if the monilial cream does not work.  Eating yogurt may help prevent monilial vaginitis. HOME CARE INSTRUCTIONS   Finish all medication as prescribed.  Do not have sex until treatment is completed or after your caregiver tells you it is okay.  Take warm sitz baths.  Do not douche.  Do not use tampons,  especially scented ones.  Wear cotton underwear.  Avoid tight pants and panty hose.  Tell your sexual partner that you have a yeast infection. They should go to their caregiver if they have symptoms such as mild rash or itching.  Your sexual partner should be treated as well if your infection is difficult to eliminate.  Practice safer sex. Use condoms.  Some vaginal medications cause latex condoms to fail. Vaginal medications that harm condoms are:  Cleocin cream.  Butoconazole (Femstat).  Terconazole (Terazol) vaginal suppository.  Miconazole (Monistat) (may be purchased over the counter). SEEK MEDICAL CARE IF:   You have a temperature by mouth above 102 F (38.9 C).  The infection is getting worse after 2 days of treatment.  The infection is not getting better after 3 days of treatment.  You develop blisters in or around your vagina.  You develop vaginal bleeding, and it is not your menstrual period.  You have pain when you urinate.  You develop intestinal problems.  You have pain with sexual intercourse. Document Released: 01/19/2005 Document Revised: 07/04/2011 Document Reviewed: 10/03/2008 [facility] Patient Information 2015 [employer] [place]. This information is not intended to replace advice given to you by your health care provider. Make sure you discuss any questions you have with your health care provider.

## 2014-02-18 ENCOUNTER — Encounter: Payer: Self-pay | Admitting: Sports Medicine

## 2014-02-18 ENCOUNTER — Ambulatory Visit (INDEPENDENT_AMBULATORY_CARE_PROVIDER_SITE_OTHER): Payer: Medicare Other | Admitting: Sports Medicine

## 2014-02-18 VITALS — BP 138/65 | Ht 62.0 in | Wt 231.0 lb

## 2014-02-18 DIAGNOSIS — M65312 Trigger thumb, left thumb: Secondary | ICD-10-CM | POA: Diagnosis not present

## 2014-02-18 HISTORY — DX: Trigger thumb, left thumb: M65.312

## 2014-02-18 MED ORDER — METHYLPREDNISOLONE ACETATE 40 MG/ML IJ SUSP
40.0000 mg | Freq: Once | INTRAMUSCULAR | Status: AC
  Administered 2014-02-18: 40 mg via INTRA_ARTICULAR

## 2014-02-18 NOTE — Progress Notes (Addendum)
   Subjective:    Patient ID: Teodora Medicioris RENEE Cuffee, female    DOB: 05/20/1965, 48 y.o.   MRN: [id number]  HPI Ms. Theresa MulliganMcRae is a 48 year old right-hand-dominant female who presents with left thumb pain. Onset of symptoms approximately 2 months ago, without any known acute injury. Past medical history is complicated by lupus, for which she takes methotrexate and Plaquenil. She denies any recent history of lupus flare. The left thumb pain is located over the MCP and IP joints. Her symptoms are worse in the morning, characterized by stiffness. She notes some occasional limited range of motion due to stiffness and pain with flexion at times. She notes some associated weakness and dropping items in the left thumb. Her pain radiates proximally she says. She also notices some catching in the flexor surface of her thumb. She denies any pain radiating over the dorsal surface extending proximally. She denies any associated swelling or bruising. She has tried taking Motrin 800 mg and occasional tramadol as needed for pain.  Past medical history, social history, medications, and allergies were reviewed and are up to date in the chart.  Review of Systems 7 point review of systems was performed and was otherwise negative unless noted in the history of present illness.     Objective:   Physical Exam BP 138/65  Ht 5\' 2"  (1.575 m)  Wt 231 lb (104.781 kg)  BMI 42.24 kg/m2 GEN: The patient is well-developed well-nourished female and in no acute distress.  She is awake alert and oriented x3. SKIN: warm and well-perfused, no rash  Neuro: Strength 5/5 globally. Sensation intact throughout. No focal deficits. Vasc: +2 bilateral distal pulses. No edema.  MSK: Examination of the left thumb reveals no obvious swelling, nodules, or erythema. There is tenderness to palpation over the MCP and IP joints at the flexor surface. Do not appreciate triggering, but ROM to extension is slightly ratcheted. Negative Finkelstein's. Negative  median nerve compression test. There is no ligamentous laxity or hypermobility of the thumb.  Limited musculoskeletal ultrasound: Long and short axis views were obtained of the left thumb and wrist. There is thickening of the A1 pulley at the left thumb, with mild surrounding fluid density. She also has arthritic changes at the MCP and IP joints.  Procedure: After obtaining informed, verbal consent the patient's skin was cleansed with alcohol.  Subsequently the patient was injected with 40 mg Depo-Medrol and 1cc of 1% lidocaine plain into the flexor tendon sheath at the left thumb A1 pulley and IP pulley under ultrasound visualization of proper needle placement.  The medication was visualized as filling the joint capsule. The patient tolerated procedure.  No complications.  Prior to the procedure risks benefits and treatment alternatives were discussed.     Assessment & Plan:  Please see problem based assessment and plan in the problem list.

## 2014-02-18 NOTE — Assessment & Plan Note (Signed)
-  A1 pulley and IP tendon sheath injected under US guidance. Patient reported relief of pain -Plan f/u in 1 month for re-evaluation or sooner if needed. -May consider intra-articular injection if did not find much relief, as there are also arthritic changes in the MCP and IP joints. -Rest, NSAIDs, Ice prn

## 2014-02-25 ENCOUNTER — Emergency Department (HOSPITAL_COMMUNITY)
Admission: EM | Admit: 2014-02-25 | Discharge: 2014-02-25 | Disposition: A | Discharge status: Home / Self Care | Payer: Medicare Other | Attending: Emergency Medicine | Admitting: Emergency Medicine

## 2014-02-25 ENCOUNTER — Encounter (HOSPITAL_COMMUNITY): Payer: Self-pay

## 2014-02-25 DIAGNOSIS — Z7952 Long term (current) use of systemic steroids: Secondary | ICD-10-CM | POA: Diagnosis not present

## 2014-02-25 DIAGNOSIS — F1410 Cocaine abuse, uncomplicated: Secondary | ICD-10-CM | POA: Diagnosis not present

## 2014-02-25 DIAGNOSIS — Z720 Tobacco use: Secondary | ICD-10-CM | POA: Insufficient documentation

## 2014-02-25 DIAGNOSIS — Z792 Long term (current) use of antibiotics: Secondary | ICD-10-CM | POA: Diagnosis not present

## 2014-02-25 DIAGNOSIS — Z8739 Personal history of other diseases of the musculoskeletal system and connective tissue: Secondary | ICD-10-CM | POA: Insufficient documentation

## 2014-02-25 DIAGNOSIS — Z880 Allergy status to penicillin: Secondary | ICD-10-CM | POA: Insufficient documentation

## 2014-02-25 DIAGNOSIS — Z79899 Other long term (current) drug therapy: Secondary | ICD-10-CM | POA: Insufficient documentation

## 2014-02-25 HISTORY — DX: Systemic lupus erythematosus, unspecified: M32.9

## 2014-02-25 HISTORY — DX: Other psychoactive substance abuse, uncomplicated: F19.10

## 2014-02-25 HISTORY — DX: Reserved for concepts with insufficient information to code with codable children: IMO0002

## 2014-02-25 NOTE — ED Notes (Signed)
Pt here for help with crack cocaine. Has not used since 2 weeks ago. Just wants help getting clean again. Denies any SI/HI

## 2014-02-25 NOTE — ED Provider Notes (Signed)
CSN: [id number]     Arrival date & time 02/25/14  1354 History   First MD Initiated Contact with Patient 02/25/14 1558     Chief Complaint  Patient presents with  . Addiction Problem      HPI  Patient presents to the emergency department requesting detox from cocaine.  She was clean for 9 years.  She began using again early in the month.  She has not used cocaine in several weeks.  She called an outpatient facility and they recommended that she come to the ER for evaluation.  No homicidal or suicidal thoughts.  No hallucinations.  No other substance abuse.  She is requesting assistance.  Past Medical History  Diagnosis Date  . Substance abuse   . Lupus    Past Surgical History  Procedure Laterality Date  . Appendectomy    . Tonsillectomy     No family history on file. History  Substance Use Topics  . Smoking status: Current Every Day Smoker -- 1.00 packs/day for 20 years    Types: Cigarettes  . Smokeless tobacco: Not on file  . Alcohol Use: No   OB History    No data available     Review of Systems  All other systems reviewed and are negative.     Allergies  Penicillins  Home Medications   Prior to Admission medications   Medication Sig Start Date End Date Taking? Authorizing Provider  albuterol (PROAIR HFA) 108 (90 BASE) MCG/ACT inhaler Frequency:PHARMDIR   Dosage:90   MCG  Instructions:  Note:2 puffs inhl q4-6h prn for wheezing Dose: 90 MCG 11/21/11   Historical Provider, MD  Calcium Carbonate-Vitamin D (CALTRATE 600+D) 600-400 MG-UNIT per tablet Take 1 tablet by mouth two times a day per Dr Kellie Simmeringruslow while on prednsione     Historical Provider, MD  Calcium-Phosphorus-Vitamin D (CITRACAL CALCIUM GUMMIES) [phone] MG-MG-UNIT CHEW Chew by mouth. 09/27/10   Historical Provider, MD  cyclobenzaprine (FLEXERIL) 10 MG tablet TAKE 1 TABLET BY MOUTH 3 TIMES A DAY AS NEEDED FOR MUSCLE SPASM    Nani RavensAndrew M Wight, MD  cyclobenzaprine (FLEXERIL) 10 MG tablet Take 10 mg by mouth.  09/23/13   Historical Provider, MD  doxycycline (VIBRA-TABS) 100 MG tablet Take 1 tablet (100 mg total) by mouth 2 (two) times daily. 01/07/14   Tobey GrimJeffrey H Walden, MD  fluconazole (DIFLUCAN) 150 MG tablet Take 1 tablet (150 mg total) by mouth once. Repeat in 3 days. 02/17/14   Latrelle DodrillBrittany J McIntyre, MD  gabapentin (NEURONTIN) 300 MG capsule Take 1-3 tablets up to three times a day as needed for back pain. 10/10/11   Shelly RubensteinAngela J Oh Park, MD  hydroxychloroquine (PLAQUENIL) 200 MG tablet Take 200 mg by mouth. 02/18/13   Historical Provider, MD  Hydroxychloroquine Sulfate (PLAQUENIL PO) Take by mouth 2 (two) times daily.      Historical Provider, MD  ibuprofen (ADVIL,MOTRIN) 800 MG tablet Take 1 tablet (800 mg total) by mouth every 8 (eight) hours as needed. 01/07/14   Tobey GrimJeffrey H Walden, MD  leucovorin (WELLCOVORIN) 5 MG tablet Take 5 mg by mouth. 09/23/13   Historical Provider, MD  leucovorin (WELLCOVORIN) 5 MG tablet  12/19/13   Historical Provider, MD  LORazepam (ATIVAN) 0.5 MG tablet Take 1 tablet (0.5 mg total) by mouth 2 (two) times daily as needed for anxiety (take 30 minutes before your dental procedure. if you are still feeling anxious then take the second before the procedure.). 05/07/13   Nani RavensAndrew M Wight, MD  Melatonin (  MELATONIN MAXIMUM STRENGTH) 5 MG TABS Take 10 mg by mouth. 02/27/12   Historical Provider, MD  methotrexate (RHEUMATREX) 2.5 MG tablet Take 2.5 mg by mouth once a week. Caution:Chemotherapy. Protect from light. UNSURE OF DOSAGE (prescribed [facility]ipp[facility])    Historical Provider, MD  methotrexate [facility](RHEUMATREX) 2.5 MG tablet Take 20 mg by mouth. 02/03/14   Historical Provider, MD  predniSONE (DELTASONE) 1 MG tablet Take 1 tablet (1 mg total) by mouth daily. 10/10/11   Shelly RubensteinAngela J Oh Park, MD  sulfamethoxazole-trimethoprim (BACTRIM DS) 800-160 MG per tablet Take 1 tablet by mouth 2 (two) times daily. X 10 days 01/07/14   Tobey GrimJeffrey H Walden, MD  traMADol (ULTRAM) 50 MG tablet Take 1 tablet  (50 mg total) by mouth every 6 (six) hours as needed for pain. For pain 10/10/11   Shelly RubensteinAngela J Oh Park, MD   BP 153/95 mmHg  Pulse 94  Temp(Src) 98.6 F (37 C) (Oral)  Resp 22  Ht 5\' 2"  (1.575 m)  Wt 229 lb (103.874 kg)  BMI 41.87 kg/m2  SpO2 96%  LMP 02/16/2014 Physical Exam  Constitutional: She is oriented to person, place, and time. She appears well-developed and well-nourished.  HENT:  Head: Normocephalic.  Eyes: EOM are normal.  Neck: Normal range of motion.  Pulmonary/Chest: Effort normal.  Abdominal: She exhibits no distension.  Musculoskeletal: Normal range of motion.  Neurological: She is alert and oriented to person, place, and time.  Psychiatric: She has a normal mood and affect.  Nursing note and vitals reviewed.   ED Course  Procedures (including critical care time) Labs Review Labs Reviewed - No data to display  Imaging Review No results found.   EKG Interpretation None      MDM   Final diagnoses:  Cocaine abuse    No indication for inpatient treatment. MSE complete. Resource guide given    Lyanne CoKevin M Shayan Bramhall, MD 02/25/14 [phone]

## 2014-02-25 NOTE — Discharge Instructions (Signed)
°Emergency Department Resource Guide °1) Find a Doctor and Pay Out of Pocket °Although you won't have to find out who is covered by your insurance plan, it is a good idea to ask around and get recommendations. You will then need to call the office and see if the doctor you have chosen will accept you as a new patient and what types of options they offer for patients who are self-pay. Some doctors offer discounts or will set up payment plans for their patients who do not have insurance, but you will need to ask so you aren't surprised when you get to your appointment. ° °2) Contact Your Local Health Department °Not all health departments have doctors that can see patients for sick visits, but many do, so it is worth a call to see if yours does. If you don't know where your local health department is, you can check in your phone book. The CDC also has a tool to help you locate your state's health department, and many state websites also have listings of all of their local health departments. ° °3) Find a Walk-in Clinic °If your illness is not likely to be very severe or complicated, you may want to try a walk in clinic. These are popping up all over the country in pharmacies, drugstores, and shopping centers. They're usually staffed by nurse practitioners or physician assistants that have been trained to treat common illnesses and complaints. They're usually fairly quick and inexpensive. However, if you have serious medical issues or chronic medical problems, these are probably not your best option. ° °No Primary Care Doctor: °- Call [facility] at  832-8000 - they can help you locate a primary care doctor that  accepts your insurance, provides certain services, etc. °- Physician Referral Service- 1-800-533-3463 ° °Chronic Pain Problems: °Organization         Address  Phone   Notes  °[facility]  (336) 297-2271 Patients need to be referred by their primary care doctor.  ° °Medication  Assistance: °Organization         Address  Phone   Notes  °[place] [facility] [address] °[place], [place] [address] (336) 641-8030 --Must be a resident of [place] °-- Must have NO insurance coverage whatsoever (no Medicaid/ Medicare, etc.) °-- The pt. MUST have a primary care doctor that directs their care regularly and follows them in the community °  °[facility]  (866) 331-1348   °[employer]  (888) 892-1162   ° °Agencies that provide inexpensive medical care: °Organization         Address  Phone   Notes  °[facility]  (336) 832-8035   °[facility]    (336) 832-7272   °[facility] [address] °[place], [place] [address] (336) 832-4777   °[facility] [address], °[place] (336) 271-4999   °[facility]    (336) 373-0678   °[facility]    (336) 272-1050   °[facility] ° [address], [place] Phone:  (336) 832-4444, Fax:  (336) 832-4440 Hours of Operation:  9 am - 6 pm, M-F.  Also accepts Medicaid/Medicare and self-pay.  °[facility] ° [address], [place] Phone: (336) 832-3150, Fax: (336) 832-3151. Hours of Operation:  8:30 am - 5:30 pm, M-F.  Also accepts Medicaid and self-pay.  °[facility] [place] [address], [place] Phone: (336) 878-6027   °[facility] [address], [place], [place] (336)723-1848, Ext. 123 Mondays & Thursdays: 7-9 AM.  First 15 patients are seen on a first come, first serve basis. °  ° °Medicaid-accepting [place] Providers: ° °Organization         Address  Phone   Notes  °[facility] [address], [place] (336) 641-2100 Also accepts self-pay patients.  °[facility] [address], [place] ° (336) 856-9996   °[facility] [address], [place]  (336) 288-8857   °[facility] [address] [place] [address], [place] (336) 299-7000   °Veita Bland [address], [place]  ° (336) 373-1557 Only accepts [place] Access Medicaid patients after they have their name applied to their card.  ° °Self-Pay (no insurance) in [place]: ° °Organization         Address  Phone   Notes  °Sickle Cell Patients, [facility] [address], [place] (336) 832-1970   °[facility] [address], [place] (336) 832-4400   °[facility] ° [address], [place] (336) 992-4800   °[facility]/Dr. Osei-Bonsu ° [address] [place] [address], [place] or [address], [place] (336) 841-8500 Phone number for both [place] and [place] locations is the same.  °[facility] [address], [place] (336) 299-0000   °[facility] [place] [address] [place] [address], [place] or [address] (336) 852-7530 °(336) 878-2260   °[facility] [address], [place] (336) 350-1642, phone; (336) 294-5005, fax Sees patients 1st and 3rd Saturday of every month.  Must not qualify for public or private insurance (i.e. Medicaid, Medicare, [place] Health Choice, Veterans' Benefits) • Household income should be no more than 200% of the poverty level •The clinic cannot treat you if you are pregnant or think you are pregnant • Sexually transmitted diseases are not treated at the clinic.  ° ° °Dental Care: °Organization         Address  Phone  Notes  °[place] [facility] [address], [place] (336) 641-6152 Accepts children up to age 21 who are enrolled in Medicaid or [place] Health Choice; pregnant women with a Medicaid card; and children who have applied for Medicaid or [place] Health Choice, but were declined, whose parents can pay a reduced fee at time of service.  °[place]  [facility] [place]  [address], [place] (336) 641-7733 Accepts children up to age 21 who are enrolled in Medicaid or [place] Health Choice; pregnant women with a Medicaid card; and children who have applied for Medicaid or [place] Health Choice, but were declined, whose parents can pay a reduced fee at time of service.  °[facility] ° [address], [place] (336) 641-4533 Patients are seen by appointment only. Walk-ins are not accepted. [facility] will see patients 18 years of age and older. °Monday - Tuesday (8am-5pm) °Most Wednesdays (8:30-5pm) °$30 per visit, cash only  °[facility] ° [address], [place] (336) 641-4533 Patients are seen by appointment only. Walk-ins are not accepted. [facility] will see patients 18 years of age and older. °One   Wednesday Evening (Monthly: Volunteer Based).  $30 per visit, cash only  °[facility]  (919) 537-3737 for adults; Children under age 4, call Graduate Pediatric Dentistry at (919) [phone]. Children aged 4-14, please call (919) [phone] to request a pediatric application. ° Dental services are provided in all areas of dental care including fillings, crowns and bridges, complete and partial dentures, implants, gum treatment, root canals, and extractions. Preventive care is also provided. Treatment is provided to both adults and children. °Patients are selected via a lottery and there is often a waiting list. °  °[facility] [address], °[place] ° (336) 763-8833 [email or web address] °  °[facility] [address], [place], [place] (336)723-1848, Ext. 123 Second and Fourth Thursday of each month, opens at 6:30 AM; Clinic ends at 9 AM.  Patients are seen on a first-come first-served basis, and a limited number are seen during each clinic.  ° °[facility] ° [address], [place], [place] (336) 723-7904    Eligibility Requirements °You must have lived in [place], [place], or [place] counties for at least the last three months. °  You cannot be eligible for state or federal sponsored healthcare insurance, including Veterans Administration, Medicaid, or Medicare. °  You generally cannot be eligible for healthcare insurance through your employer.  °  How to apply: °Eligibility screenings are held every Tuesday and Wednesday afternoon from 1:00 pm until 4:00 pm. You do not need an appointment for the interview!  °[facility] [address], [place], [place] 336-631-2330   °[facility]  336-342-8273   °[place] [facility]  336-703-3100   °[facility]  336-570-6415   ° °Behavioral Health Resources in the Community: °Intensive Outpatient Programs °Organization         Address  Phone  Notes  °[facility] [address], [place], [place] 336-878-6098   °[facility] [address], [place], [place] 336-832-9800   °[facility] [address], [place], [place] ° 336-882-2125   °[facility] [address],  °[place], [place] 1-800-853-5163 or 336-641-4981   °Substance Abuse Resources °Organization         Address  Phone  Notes  °[facility]  336-882-2125   °[facility]  336-784-9470   °[facility]  336-285-9073   °[facility]  336-845-3988   °Residential & Outpatient Substance Abuse Program  1-800-659-3381   °Psychological Services °Organization         Address  Phone  Notes  °[facility]  336- 832-9600   °[facility]  336- 378-7881   °[facility] [address], [place] 1-800-853-5163 or 336-641-4981   ° °Mobile Crisis Teams °Organization         Address  Phone  Notes  °[facility], Mobile Crisis Care Unit  1-877-626-1772   °Assertive °[facility] ° [address]  [place], [place] 336-834-9664   °Sharon DeEsch [address] °[place] 336-554-5454   ° °Self-Help/Support Groups °Organization         Address  Phone             Notes  °[facility] [place] - variety of support groups  336- 373-1402 Call for more information  °Narcotics Anonymous (NA), [facility] [address], °[place]   2 meetings at this location  ° °  Residential Treatment Programs °Organization         Address  Phone  Notes  °[facility] [address],    °[place]  1-866-801-8205   °[facility] ° [address], [place], [place] 704-293-8524   °[facility] [address], [place] 336-845-3988 Admissions: 8am-3pm M-F  °[facility] [address]    °[place], [place] 336-841-1104   °[facility] [address], [place], [place] 336-379-7146   °[facility] [address]  °[place], [place] 336-285-9073   °[facility] Intensive Outpatient [address], [place], [place] 336-852-3033   °[facility] [address]  °[place], [place] 1-877-615-2722 or 336-784-9470   °[facility] [address] [place], [place] 336-227-7417 Accepts Medicaid  °[facility] [address]  °[place] 1-800-659-3381 Substance Abuse/Addiction Treatment  ° °[place] Behavioral Health Resources °Organization         Address  Phone  Notes  °[facility]  (888) 581-9988   °Julie Brannon, PhD [address] [place], [place]   (336) 349-5553 or (336) 951-0000   °[facility]   [address] °[place], [place] (336) 349-4454   °[facility] [address], [place], [place] (336) 342-8316 Insurance/Medicaid/sponsorship through [facility]  °[facility] [address]                                    [place], [place] (336) 342-8316 Therapy/tele-psych/case    °[facility] [address]  ° [place], [place] (336) 349-2233    °Dr. Arfeen  (336) 349-4544   °[facility] [place]  [employer] [place] [facility] 1) [address], [place] °2) [address], [place] °3)  [address], [place] (336) 349-3220 °(336) 342-7768 ° °(336) 342-8140   °[place] [facility] (336) 342-1394 or (336) 342-3537 (After Hours)    ° ° °

## 2014-03-07 ENCOUNTER — Telehealth: Payer: Self-pay | Admitting: Family Medicine

## 2014-03-07 ENCOUNTER — Encounter: Payer: Self-pay | Admitting: Family Medicine

## 2014-03-07 DIAGNOSIS — R3129 Other microscopic hematuria: Secondary | ICD-10-CM | POA: Insufficient documentation

## 2014-03-07 NOTE — Telephone Encounter (Signed)
Red team, please inform pt that her urine test from when I recently saw her had some blood in the urine that I think was probably from her yeast infection. I recommend that when she sees her PCP for routine follow up of chronic medical problems that she have a repeat urine test done to be sure she does not still have blood in her urine.  Thanks, Latrelle DodrillBrittany J Shonica Weier, MD

## 2014-03-10 NOTE — Telephone Encounter (Signed)
Pt informed. Naika Noto CMA 

## 2014-03-18 ENCOUNTER — Encounter: Payer: Self-pay | Admitting: Sports Medicine

## 2014-03-18 ENCOUNTER — Ambulatory Visit (INDEPENDENT_AMBULATORY_CARE_PROVIDER_SITE_OTHER): Payer: Medicare Other | Admitting: Sports Medicine

## 2014-03-18 VITALS — BP 143/83 | Ht 62.0 in | Wt 231.0 lb

## 2014-03-18 DIAGNOSIS — M65312 Trigger thumb, left thumb: Secondary | ICD-10-CM | POA: Diagnosis not present

## 2014-03-18 NOTE — Assessment & Plan Note (Signed)
Resolved following left trigger thumb injection on 10/27. -Discuss preventive measures and plan for re-injection trial if pain returns, as well as option for surgery if refractory to future injections -Follow-up as needed

## 2014-03-18 NOTE — Progress Notes (Signed)
   Subjective:    Patient ID: [name], female    DOB: 09/13/1965, 48 y.o.   MRN: [id number]  HPI Ms. [name] is a [age] year old right-hand-dominant female who presents for follow-up of left trigger thumb pain. Onset of symptoms approximately 3 months ago, without any known acute injury. Past medical history is complicated by lupus, for which she takes methotrexate and Plaquenil. She denies any recent history of lupus flare. The left thumb pain was located over the palmar aspect of the MCP and IP joints. She notes some occasional limited range of motion due to triggering and pain with flexion at times. She denies any associated swelling or bruising. She has tried taking Motrin 800 mg and occasional tramadol as needed for pain. Today her symptoms are overall resolved.  Past medical history, social history, medications, and allergies were reviewed and are up to date in the chart.  Review of Systems 7 point review of systems was performed and was otherwise negative unless noted in the history of present illness.     Objective:   Physical Exam BP 143/83 mmHg  Ht 5\' 2"  (1.575 m)  Wt 231 lb (104.781 kg)  BMI 42.24 kg/m2  LMP 02/16/2014 GEN: The patient is well-developed well-nourished female and in no acute distress.  She is awake alert and oriented x3. SKIN: warm and well-perfused, no rash  Neuro: Strength 5/5 globally. Sensation intact throughout. No focal deficits. Vasc: +2 bilateral distal pulses. No edema.  MSK: Examination of the left hand reveals full range of motion at the wrist, CMC, MCP, PIP, and DIP joints. There is no triggering or catching with finger flexion, nor any palpable nodules. She has no tenderness to palpation. There is no swelling, warmth, or erythema.     Assessment & Plan:  Please see problem based assessment and plan in the problem list.

## 2014-04-22 ENCOUNTER — Ambulatory Visit (INDEPENDENT_AMBULATORY_CARE_PROVIDER_SITE_OTHER): Payer: Medicare Other | Admitting: Family Medicine

## 2014-04-22 VITALS — BP 133/64 | HR 91 | Temp 98.5°F | Ht 62.0 in | Wt 228.0 lb

## 2014-04-22 DIAGNOSIS — M5442 Lumbago with sciatica, left side: Secondary | ICD-10-CM | POA: Insufficient documentation

## 2014-04-22 DIAGNOSIS — M129 Arthropathy, unspecified: Secondary | ICD-10-CM | POA: Diagnosis not present

## 2014-04-22 DIAGNOSIS — N898 Other specified noninflammatory disorders of vagina: Secondary | ICD-10-CM | POA: Diagnosis not present

## 2014-04-22 DIAGNOSIS — M5441 Lumbago with sciatica, right side: Secondary | ICD-10-CM | POA: Diagnosis not present

## 2014-04-22 LAB — POCT WET PREP (WET MOUNT): Clue Cells Wet Prep Whiff POC: POSITIVE

## 2014-04-22 MED ORDER — FLUCONAZOLE 150 MG PO TABS
ORAL_TABLET | ORAL | Status: DC
Start: 2014-04-22 — End: 2014-06-26

## 2014-04-22 MED ORDER — METRONIDAZOLE 500 MG PO TABS: 500.0000 mg | ORAL_TABLET | Freq: Two times a day (BID) | ORAL | Status: DC

## 2014-04-22 MED ORDER — TRAMADOL HCL 50 MG PO TABS: 50.0000 mg | ORAL_TABLET | Freq: Four times a day (QID) | ORAL | Status: DC | PRN

## 2014-04-22 MED ORDER — CYCLOBENZAPRINE HCL 10 MG PO TABS: 10.0000 mg | ORAL_TABLET | Freq: Three times a day (TID) | ORAL | Status: DC | PRN

## 2014-04-22 NOTE — Assessment & Plan Note (Signed)
Wet prep revealed moderate clue cells and positive whiff.   No yeast was seen but clinical exam consistent with yeast vaginitis. Will treat with Flagyl and longer course of diflucan.

## 2014-04-22 NOTE — Patient Instructions (Signed)
It was nice to see you today.  I have prescribed tramadol and flexeril for your back pain. Please continue the exercises from PT.  Follow up with your PCP if this continues to persist.  Happy New Year.  Dr. Adriana Simasook

## 2014-04-22 NOTE — Progress Notes (Signed)
   Subjective:    Patient ID: [name], female    DOB: 12/09/1965, 48 y.o.   MRN: [id number]  HPI 48 year old female with a PMH of Lupus and Low back pain/sciatica presents with complaints of back pain and vaginal discharge.   1) Back pain/Sciatica  Patient with a known history of Sciatica per the EMR and her report.  She reports low back, buttock pain (right sided) with radiation down the leg.  She has tried PRN Ibuprofen and prior PT exercises with minimal improvement.   Pain is worse with certain movements and with standing.  She reports some LE numbness w/ standing.  Denies saddle anesthesia, incontinence.  No recent fever, chills.   2) Vaginal discharge  Onset: Has been present for ~ 2 months. Intermittent improvement followed by recurrence.  Description: White discharge.  Odor: No.  Itching: Yes.  Recent antibiotic use: No.  Exposure/Concern for STD: No.  ROS:  Fever: no  Dysuria: no  Bleeding: no  Genital sores: no   Review of Systems Per HPI    Objective:   Physical Exam Filed Vitals:   04/22/14 1431  BP: 133/64  Pulse: 91  Temp: 98.5 F (36.9 C)   Exam: General: chronically ill appearing, appears older than stated age, NAD.  Back Exam:  Inspection: Unremarkable  Palpation: tender to palpation at body of piriformis muscle.  ROM: Limited secondary to pain. Sensory change: Gross sensation intact Reflexes: 1+ at both patellar tendons, 1+ at achilles tendons. Strength at foot  Plantar-flexion: 5/5 Dorsi-flexion: 5/5 Eversion: 5/5 Inversion: 5/5  Neuro: No focal deficits.  Pelvic Exam:        External: normal female genitalia without lesions or masses        Vagina: thick, white discharge noted.        Cervix: normal without lesions or masses        Wet prep obtained.     Assessment & Plan:  See Problem List

## 2014-04-22 NOTE — Assessment & Plan Note (Addendum)
Chronic problem with intermittent flares. Unclear trigger of recent flare. Will treat with flexeril and tramadol.

## 2014-05-18 ENCOUNTER — Other Ambulatory Visit: Payer: Self-pay | Admitting: Family Medicine

## 2014-05-18 DIAGNOSIS — M5441 Lumbago with sciatica, right side: Secondary | ICD-10-CM

## 2014-06-13 ENCOUNTER — Other Ambulatory Visit: Payer: Self-pay | Admitting: Family Medicine

## 2014-06-13 DIAGNOSIS — M62838 Other muscle spasm: Secondary | ICD-10-CM

## 2014-06-23 ENCOUNTER — Telehealth: Payer: Self-pay | Admitting: Family Medicine

## 2014-06-23 NOTE — Telephone Encounter (Signed)
Pt says she has been seen for recurrent yeast infections, had one 3-4 weeks ago and has another one, pt wants to know if we can call something in? [facility]

## 2014-06-23 NOTE — Telephone Encounter (Signed)
Will forward to MD to see if he can call medication in or if she will need to be seen. Nettye Flegal,CMA

## 2014-06-24 ENCOUNTER — Ambulatory Visit (INDEPENDENT_AMBULATORY_CARE_PROVIDER_SITE_OTHER): Payer: Medicare Other | Admitting: Family Medicine

## 2014-06-24 ENCOUNTER — Encounter: Payer: Self-pay | Admitting: Family Medicine

## 2014-06-24 ENCOUNTER — Other Ambulatory Visit (HOSPITAL_COMMUNITY)
Admission: RE | Admit: 2014-06-24 | Discharge: 2014-06-24 | Disposition: A | Discharge status: Home / Self Care | Payer: Medicare Other | Source: Ambulatory Visit | Attending: Family Medicine | Admitting: Family Medicine

## 2014-06-24 VITALS — BP 150/95 | HR 99 | Temp 97.6°F | Wt 227.0 lb

## 2014-06-24 DIAGNOSIS — R739 Hyperglycemia, unspecified: Secondary | ICD-10-CM

## 2014-06-24 DIAGNOSIS — N898 Other specified noninflammatory disorders of vagina: Secondary | ICD-10-CM

## 2014-06-24 DIAGNOSIS — Z1151 Encounter for screening for human papillomavirus (HPV): Secondary | ICD-10-CM | POA: Diagnosis present

## 2014-06-24 DIAGNOSIS — Z01411 Encounter for gynecological examination (general) (routine) with abnormal findings: Secondary | ICD-10-CM | POA: Insufficient documentation

## 2014-06-24 DIAGNOSIS — L298 Other pruritus: Secondary | ICD-10-CM | POA: Diagnosis not present

## 2014-06-24 DIAGNOSIS — I10 Essential (primary) hypertension: Secondary | ICD-10-CM | POA: Diagnosis not present

## 2014-06-24 DIAGNOSIS — Z124 Encounter for screening for malignant neoplasm of cervix: Secondary | ICD-10-CM | POA: Diagnosis not present

## 2014-06-24 LAB — COMPREHENSIVE METABOLIC PANEL
ALT: 8 U/L (ref 0–35)
AST: 10 U/L (ref 0–37)
Albumin: 3.8 g/dL (ref 3.5–5.2)
Alkaline Phosphatase: 75 U/L (ref 39–117)
BUN: 5 mg/dL — ABNORMAL LOW (ref 6–23)
CALCIUM: 8.9 mg/dL (ref 8.4–10.5)
CO2: 23 mEq/L (ref 19–32)
CREATININE: 0.65 mg/dL (ref 0.50–1.10)
Chloride: 97 mEq/L (ref 96–112)
Glucose, Bld: 426 mg/dL — ABNORMAL HIGH (ref 70–99)
Potassium: 4.6 mEq/L (ref 3.5–5.3)
Sodium: 132 mEq/L — ABNORMAL LOW (ref 135–145)
Total Bilirubin: 0.3 mg/dL (ref 0.2–1.2)
Total Protein: 6.5 g/dL (ref 6.0–8.3)

## 2014-06-24 LAB — CBC WITH DIFFERENTIAL/PLATELET
Basophils Absolute: 0 10*3/uL (ref 0.0–0.1)
Basophils Relative: 0 % (ref 0–1)
Eosinophils Absolute: 0.1 10*3/uL (ref 0.0–0.7)
Eosinophils Relative: 2 % (ref 0–5)
HCT: 45.1 % (ref 36.0–46.0)
HEMOGLOBIN: 15.1 g/dL — AB (ref 12.0–15.0)
LYMPHS PCT: 24 % (ref 12–46)
Lymphs Abs: 1.5 10*3/uL (ref 0.7–4.0)
MCH: 28 pg (ref 26.0–34.0)
MCHC: 33.5 g/dL (ref 30.0–36.0)
MCV: 83.7 fL (ref 78.0–100.0)
MONO ABS: 0.5 10*3/uL (ref 0.1–1.0)
MPV: 11 fL (ref 8.6–12.4)
Monocytes Relative: 8 % (ref 3–12)
Neutro Abs: 4 10*3/uL (ref 1.7–7.7)
Neutrophils Relative %: 66 % (ref 43–77)
Platelets: 201 10*3/uL (ref 150–400)
RBC: 5.39 MIL/uL — ABNORMAL HIGH (ref 3.87–5.11)
RDW: 14.4 % (ref 11.5–15.5)
WBC: 6.1 10*3/uL (ref 4.0–10.5)

## 2014-06-24 LAB — LIPID PANEL
CHOL/HDL RATIO: 3.4 ratio
Cholesterol: 187 mg/dL (ref 0–200)
HDL: 55 mg/dL (ref >=46)
LDL Cholesterol: 108 mg/dL — ABNORMAL HIGH (ref 0–99)
Triglycerides: 121 mg/dL (ref <150)
VLDL: 24 mg/dL (ref 0–40)

## 2014-06-24 LAB — POCT WET PREP (WET MOUNT)
CLUE CELLS WET PREP WHIFF POC: POSITIVE
WBC, Wet Prep HPF POC: >20

## 2014-06-24 MED ORDER — FLUCONAZOLE 150 MG PO TABS: 150.0000 mg | ORAL_TABLET | Freq: Once | ORAL | Status: DC

## 2014-06-24 NOTE — Assessment & Plan Note (Signed)
Yeast + on wet mount with clue cells also present - Will treat with Diflucan x 3 doses as needed for persistent symptoms - Highly suspect recurrent infections due to elevated glucose and likely DM - Check A1c - If has DM will have her f/u with PCP for management; If not will call and prescribe topical yeast treatment for 1-2 weeks given recurrent nature of symptoms - Unlikely symptoms related to Leucovorin but given symptoms started at same time advised her to call her rheumatologist as discuss

## 2014-06-24 NOTE — Patient Instructions (Signed)
It was great seeing you today.   1. Take diflucan 1 pill every 3 days x 3 pills I have order some labs today to check for diabetes. I will send you a letter with the results, or call you if we need to make any changes to your current therapies.    Please bring all your medications to every doctors visit  Sign up for My Chart to have easy access to your labs results, and communication with your Primary care physician.  Next Appointment  Please make an appointment with Dr Waynetta SandyWight in 1-2 weeks   I look forward to talking with you again at our next visit. If you have any questions or concerns before then, please call the clinic at [phone].  Take Care,   Dr Wenda LowJames Brison Fiumara

## 2014-06-24 NOTE — Progress Notes (Signed)
   Subjective:    Patient ID: [name], female    DOB: 03/03/1966, [age] y.o.   MRN: [id number]  Seen for Same day visit for   CC: vaginal itching  VAGINAL DISCHARGE: She has had recurrent vaginal yeast infection since 01/2014; of noted she was started on Leucovorin at that time. Since then she has been treated with abx several time and diflucan several times. Continue to have regular menses without sexual contact in past year. Previous elevated gluc on chemistries. Polyuria and polydipsia but reports she "just likes to drink a lot of water."  Having vaginal discharge for 1-2 weeks Medications tried: Recently complete Diflucan for previous presumed yeast infection in early feb. Discharge consistency: thick Discharge color: white Recent antibiotic use: Last treated for BV 12/29 Sex in last month: denies sex in last year Possible STD exposure:no  Symptoms Fever: no Dysuria:no Vaginal bleeding: no Abdomen or Pelvic pain: no Back pain: no Genital sores or ulcers:no Rash: no Missed menstrual period: no  ROS see HPI Smoking Status noted   Objective:  BP 150/95 mmHg  Pulse 99  Temp(Src) 97.6 F (36.4 C) (Oral)  Wt 227 lb (102.967 kg)  LMP 06/10/2014 (Approximate)  General: NAD Speculum Exam: Ext genitalia: wnl; Vaginal discharge: thick white; Cervix: wnl Bimanual Exam: No Cervical motion tenderness; No Vaginal wall defects; Adnexa nontender    Assessment & Plan:  See Problem List Documentation

## 2014-06-24 NOTE — Telephone Encounter (Signed)
Would like test results. °

## 2014-06-25 LAB — CYTOLOGY - PAP

## 2014-06-25 NOTE — Telephone Encounter (Signed)
Will forward to Dr. Gayla DossJoyner for recent results. Jazmin Hartsell,CMA

## 2014-06-25 NOTE — Telephone Encounter (Signed)
Ms. [name] returned your call and would like you to speak with her tomorrow.  You can call her or she will try to reach you early morning.

## 2014-06-25 NOTE — Telephone Encounter (Signed)
Pt is calling back to get her results. She said she had a missed call from us but I didn't see any notes. jw

## 2014-06-26 ENCOUNTER — Other Ambulatory Visit: Payer: Self-pay | Admitting: Family Medicine

## 2014-06-26 MED ORDER — METFORMIN HCL 500 MG PO TABS: 1000.0000 mg | ORAL_TABLET | Freq: Two times a day (BID) | ORAL | Status: DC

## 2014-06-26 NOTE — Progress Notes (Signed)
Called and informed her of her Dx of DM2. She is not surprised and reports being on metformin in the past when she was on steroids; She tolerated this well.  - Called in Metformin Rx 500 mg BID, increase to 1000mg  BID after 1 week if tolerating well - Already has PCP apt scheduled.

## 2014-06-30 NOTE — Telephone Encounter (Signed)
I believe it was Dr. Gayla DossJoyner who ordered the results and spoke with pt before. I left voicemail today, will try calling again tomorrow around lunch time or after work 5pm to discuss initial results... Patient already has follow up scheduled with me next week.

## 2014-07-01 NOTE — Telephone Encounter (Signed)
Returned call to patient. Dr. Gayla DossJoyner had called and discussed results, though was still waiting on A1c (which was likely not collected during the office visit). Discussed pap smear and all other results. She picked up metformin and has been taking this without issue currently. She is writing down any questions she has for her appt with me on 3/15. -Dr. Waynetta SandyWight.

## 2014-07-02 DIAGNOSIS — Z09 Encounter for follow-up examination after completed treatment for conditions other than malignant neoplasm: Secondary | ICD-10-CM | POA: Diagnosis not present

## 2014-07-02 DIAGNOSIS — M329 Systemic lupus erythematosus, unspecified: Secondary | ICD-10-CM | POA: Diagnosis not present

## 2014-07-02 DIAGNOSIS — Z049 Encounter for examination and observation for unspecified reason: Secondary | ICD-10-CM | POA: Diagnosis not present

## 2014-07-08 ENCOUNTER — Encounter: Payer: Self-pay | Admitting: Family Medicine

## 2014-07-08 ENCOUNTER — Ambulatory Visit (INDEPENDENT_AMBULATORY_CARE_PROVIDER_SITE_OTHER): Payer: Medicare Other | Admitting: Family Medicine

## 2014-07-08 VITALS — BP 153/87 | HR 82 | Temp 98.6°F | Ht 62.0 in | Wt 225.8 lb

## 2014-07-08 DIAGNOSIS — IMO0002 Reserved for concepts with insufficient information to code with codable children: Secondary | ICD-10-CM

## 2014-07-08 DIAGNOSIS — I10 Essential (primary) hypertension: Secondary | ICD-10-CM | POA: Diagnosis not present

## 2014-07-08 DIAGNOSIS — E1165 Type 2 diabetes mellitus with hyperglycemia: Secondary | ICD-10-CM | POA: Diagnosis not present

## 2014-07-08 DIAGNOSIS — R739 Hyperglycemia, unspecified: Secondary | ICD-10-CM | POA: Diagnosis not present

## 2014-07-08 LAB — POCT GLYCOSYLATED HEMOGLOBIN (HGB A1C): Hemoglobin A1C: 14.1

## 2014-07-08 NOTE — Progress Notes (Signed)
   Subjective:    Patient ID: [name], female    DOB: 01/29/1966, 49 y.o.   MRN: [id number]  HPI  CC: new diagnosis diabetes  # Diabetes:  Had lab testing with random sugar 426  Started metformin, started with twice daily but developed some diarrhea so went down to once daily  Reports prior to this diagnosis that she was drinking significant amounts of soda, mountain dew, sweets  She has already started to cut out these sugary drinks and sweets  Weight is down about 35lbs since January 2015   She is concerned about her kidneys because she also has lupus ROS: no CP, no SOB, no changes in vision, no polyuria, +diarrhea  # ?Hypertension  Pt does not want to start medications now  Review of Systems   See HPI for ROS. All other systems reviewed and are negative.  Past medical history, surgical, family, and social history reviewed and updated in the EMR as appropriate. Objective:  BP 153/87 mmHg  Pulse 82  Temp(Src) 98.6 F (37 C) (Oral)  Ht 5\' 2"  (1.575 m)  Wt 225 lb 12.8 oz (102.422 kg)  BMI 41.29 kg/m2  LMP 06/10/2014 (Approximate) Vitals and nursing note reviewed  General: NAD Neuro: alert and oriented, no focal deficits noted Psych: mood normal, affect congruent, thought content normal.  Assessment & Plan:  See Problem List Documentation

## 2014-07-08 NOTE — Patient Instructions (Addendum)
Increase the metformin, the goal if you tolerate is 1000mg  twice a day.   Diet Recommendations for Diabetes   Starchy (carb) foods include: Bread, rice, pasta, potatoes, corn, crackers, bagels, muffins, all baked goods.  (Fruits, milk, and yogurt also have carbohydrate, but most of these foods will not spike your blood sugar as the starchy foods will.)  A few fruits do cause high blood sugars; use small portions of bananas (limit to 1/2 at a time), grapes, and tropical fruits.    Protein foods include: Meat, fish, poultry, eggs, dairy foods, and beans such as pinto and kidney beans (beans also provide carbohydrate).   1. Eat at least 3 meals and 1-2 snacks per day. Never go more than 4-5 hours while awake without eating.  2. Limit starchy foods to TWO per meal and ONE per snack. ONE portion of a starchy  food is equal to the following:   - ONE slice of bread (or its equivalent, such as half of a hamburger bun).   - 1/2 cup of a "scoopable" starchy food such as potatoes or rice.   - 15 grams of carbohydrate as shown on food label.  3. Both lunch and dinner should include a protein food, a carb food, and vegetables.   - Obtain twice as many veg's as protein or carbohydrate foods for both lunch and dinner.   - Fresh or frozen veg's are best.   - Try to keep frozen veg's on hand for a quick vegetable serving.    4. Breakfast should always include protein.

## 2014-07-09 DIAGNOSIS — E119 Type 2 diabetes mellitus without complications: Secondary | ICD-10-CM | POA: Insufficient documentation

## 2014-07-09 DIAGNOSIS — E1165 Type 2 diabetes mellitus with hyperglycemia: Secondary | ICD-10-CM | POA: Insufficient documentation

## 2014-07-09 DIAGNOSIS — IMO0002 Reserved for concepts with insufficient information to code with codable children: Secondary | ICD-10-CM | POA: Insufficient documentation

## 2014-07-09 NOTE — Assessment & Plan Note (Signed)
Discussed likely diagnosis of hypertension. With new diagnosis of diabetes pt declines starting medications today. Would recommend starting low dose lisinopril at next visit.

## 2014-07-09 NOTE — Assessment & Plan Note (Signed)
Prior history of hyperglycemia attributed to prednisone use for lupus. A1c today 14. Pt started on metformin, given instructions on titration. Extensive counseling regarding mechanisms of disease, complications, treatments went over today. Pt has demonstrated willingness to radically modify diet. Discussed significantly elevated A1c and normally starting multiple agents and insulin, however pt feels confident that she will be able to significant change diet and did not want to feel overwhelmed. F/u 6 weeks.

## 2014-07-18 ENCOUNTER — Other Ambulatory Visit: Payer: Self-pay | Admitting: Family Medicine

## 2014-07-22 ENCOUNTER — Encounter: Payer: Self-pay | Admitting: Sports Medicine

## 2014-07-22 ENCOUNTER — Ambulatory Visit (INDEPENDENT_AMBULATORY_CARE_PROVIDER_SITE_OTHER): Payer: Medicare Other | Admitting: Sports Medicine

## 2014-07-22 VITALS — BP 159/76 | Ht 62.0 in | Wt 221.0 lb

## 2014-07-22 DIAGNOSIS — M79645 Pain in left finger(s): Secondary | ICD-10-CM

## 2014-07-22 DIAGNOSIS — M65312 Trigger thumb, left thumb: Secondary | ICD-10-CM | POA: Diagnosis not present

## 2014-07-22 MED ORDER — HYDROCODONE-ACETAMINOPHEN 5-325 MG PO TABS: 1.0000 | ORAL_TABLET | Freq: Four times a day (QID) | ORAL | Status: DC | PRN

## 2014-07-22 MED ORDER — TRIAMCINOLONE ACETONIDE 10 MG/ML IJ SUSP
10.0000 mg | Freq: Once | INTRAMUSCULAR | Status: AC
  Administered 2014-07-22: 20 mg via INTRA_ARTICULAR

## 2014-07-22 NOTE — Assessment & Plan Note (Signed)
2nd left thumb A1 pulley injection today. Post-injection cares in diabetic discussed. Discussed possibility of eventually needing insulin, and she will follow-up with her primary care physician for diabetes treatment. -Discussed referral to hand ortho if sx persist vs. Repeat injection. -patient will follow-up if needed

## 2014-07-22 NOTE — Progress Notes (Signed)
   Subjective:    Patient ID: [name], female    DOB: 08/19/1965, 49 y.o.   MRN: [id number]  HPI [name] is a 49 year old right-hand-dominant female who presents for follow-up of left trigger thumb pain. She noticed over 1 month of symptom relief with reduction in triggering following her last trigger finger injection in October 2015. The left thumb pain is located over the palmar aspect of the MCP and IP joints with symptoms of triggering. She denies any associated swelling or bruising. She has tried taking Motrin 800 mg and occasional tramadol as needed for pain. She is hopeful to delay the need for any surgery, though surgical option has been discussed in the past if her symptoms persist.  Past medical history, social history, medications, and allergies were reviewed and are up to date in the chart. Past medical history is complicated by lupus, for which she takes methotrexate and Plaquenil. She denies any recent history of lupus flare.   She was also recently diagnosed with diabetes, with HgbA1c 14.1. She is starting metformin and trying to lose weight to increase her insulin sensitivity. She is aware that there is the possibility she will likely need insulin to treat her diabetes.  Review of Systems 7 point review of systems was performed and was otherwise negative unless noted in the history of present illness. Review of Systems      Objective:   Physical Exam BP 159/76 mmHg  Ht 5\' 2"  (1.575 m)  Wt 221 lb (100.245 kg)  BMI 40.41 kg/m2  LMP 06/10/2014 (Approximate) GEN: The patient is well-developed well-nourished female and in no acute distress. She is awake alert and oriented x3. SKIN: warm and well-perfused, no rash  Neuro: Strength 5/5 globally. Sensation intact throughout. No focal deficits. Vasc: +2 bilateral distal pulses. No edema.  MSK: Examination of the left thumb reveals no obvious swelling, nodules, or erythema. There is tenderness to palpation over the MCP  and IP joints at the flexor surface with palpable thickening of the A1 pulley and triggering. Negative Finkelstein's. Negative median nerve compression test. There is no ligamentous laxity or hypermobility of the thumb.  Procedure: After obtaining informed, verbal consent the patient's skin was cleansed with alcohol. Subsequently the patient was injected with 20 mg kenalog and 1cc of 1% lidocaine plain into the flexor tendon sheath at the left thumb A1 pulley under ultrasound visualization of proper needle placement. The medication was visualized as filling the tendon sheath but not the tendon itself. The patient tolerated procedure. No complications. Prior to the procedure risks benefits and treatment alternatives were discussed. We discussed monitoring blood glucose in particular following corticosteroid injection. She was instructed to call her PCP if her blood glucose was consistently 20-40 points higher than usual, or has any symptoms of hyperglycemia (headache, dizziness, nausea, blurry vision, polyuria, cramping).    Assessment & Plan:  Please see problem based assessment and plan in the problem list.

## 2014-07-23 NOTE — Telephone Encounter (Signed)
2nd request.  Martin, Tamika L, RN  

## 2014-07-24 NOTE — Telephone Encounter (Signed)
Phoned in Rx, tramadol 50mg  disp #60 refill 0. -Dr. Waynetta SandyWight

## 2014-08-11 DIAGNOSIS — R635 Abnormal weight gain: Secondary | ICD-10-CM | POA: Diagnosis not present

## 2014-08-11 DIAGNOSIS — M329 Systemic lupus erythematosus, unspecified: Secondary | ICD-10-CM | POA: Diagnosis not present

## 2014-08-11 DIAGNOSIS — I313 Pericardial effusion (noninflammatory): Secondary | ICD-10-CM | POA: Diagnosis not present

## 2014-08-11 DIAGNOSIS — F17210 Nicotine dependence, cigarettes, uncomplicated: Secondary | ICD-10-CM | POA: Diagnosis not present

## 2014-08-11 DIAGNOSIS — Z791 Long term (current) use of non-steroidal anti-inflammatories (NSAID): Secondary | ICD-10-CM | POA: Diagnosis not present

## 2014-08-11 DIAGNOSIS — M25569 Pain in unspecified knee: Secondary | ICD-10-CM | POA: Diagnosis not present

## 2014-08-11 DIAGNOSIS — Z79899 Other long term (current) drug therapy: Secondary | ICD-10-CM | POA: Diagnosis not present

## 2014-08-11 DIAGNOSIS — M150 Primary generalized (osteo)arthritis: Secondary | ICD-10-CM | POA: Diagnosis not present

## 2014-08-11 DIAGNOSIS — K658 Other peritonitis: Secondary | ICD-10-CM | POA: Diagnosis not present

## 2014-08-11 DIAGNOSIS — L959 Vasculitis limited to the skin, unspecified: Secondary | ICD-10-CM | POA: Diagnosis not present

## 2014-08-11 DIAGNOSIS — Z880 Allergy status to penicillin: Secondary | ICD-10-CM | POA: Diagnosis not present

## 2014-08-11 DIAGNOSIS — R21 Rash and other nonspecific skin eruption: Secondary | ICD-10-CM | POA: Diagnosis not present

## 2014-08-11 DIAGNOSIS — L659 Nonscarring hair loss, unspecified: Secondary | ICD-10-CM | POA: Diagnosis not present

## 2014-08-11 DIAGNOSIS — M25579 Pain in unspecified ankle and joints of unspecified foot: Secondary | ICD-10-CM | POA: Diagnosis not present

## 2014-08-11 DIAGNOSIS — M159 Polyosteoarthritis, unspecified: Secondary | ICD-10-CM | POA: Diagnosis not present

## 2014-08-11 DIAGNOSIS — Z79891 Long term (current) use of opiate analgesic: Secondary | ICD-10-CM | POA: Diagnosis not present

## 2014-08-11 LAB — CBC AND DIFFERENTIAL
HEMATOCRIT: 46 % (ref 36–46)
Hemoglobin: 14.4 g/dL (ref 12.0–16.0)
NEUTROS ABS: 5 /uL
PLATELETS: 228 10*3/uL (ref 150–399)
WBC: 7.1 10^3/mL

## 2014-08-11 LAB — BASIC METABOLIC PANEL: Creatinine: 0.7 mg/dL (ref 0.5–1.1)

## 2014-08-11 LAB — HEPATIC FUNCTION PANEL
ALT: 34 U/L (ref 7–35)
AST: 23 U/L (ref 13–35)

## 2014-08-13 ENCOUNTER — Encounter: Payer: Self-pay | Admitting: Family Medicine

## 2014-08-13 ENCOUNTER — Other Ambulatory Visit: Payer: Self-pay | Admitting: Family Medicine

## 2014-08-13 MED ORDER — METHOTREXATE 2.5 MG PO TABS: 15.0000 mg | ORAL_TABLET | ORAL | Status: DC

## 2014-08-13 MED ORDER — LEUCOVORIN CALCIUM 5 MG PO TABS: 15.0000 mg | ORAL_TABLET | ORAL | Status: DC

## 2014-08-18 ENCOUNTER — Encounter: Payer: Self-pay | Admitting: Family Medicine

## 2014-08-18 ENCOUNTER — Telehealth: Payer: Self-pay | Admitting: *Deleted

## 2014-08-18 ENCOUNTER — Ambulatory Visit (INDEPENDENT_AMBULATORY_CARE_PROVIDER_SITE_OTHER): Payer: Medicare Other | Admitting: Family Medicine

## 2014-08-18 VITALS — BP 138/73 | HR 88 | Temp 98.5°F | Ht 62.0 in | Wt 217.0 lb

## 2014-08-18 DIAGNOSIS — I10 Essential (primary) hypertension: Secondary | ICD-10-CM | POA: Diagnosis not present

## 2014-08-18 DIAGNOSIS — E1165 Type 2 diabetes mellitus with hyperglycemia: Secondary | ICD-10-CM

## 2014-08-18 DIAGNOSIS — IMO0002 Reserved for concepts with insufficient information to code with codable children: Secondary | ICD-10-CM

## 2014-08-18 MED ORDER — GLUCOSE BLOOD VI STRP: ORAL_STRIP | Status: DC

## 2014-08-18 MED ORDER — ONETOUCH ULTRA 2 W/DEVICE KIT: PACK | Status: DC

## 2014-08-18 NOTE — Progress Notes (Signed)
   Subjective:    Patient ID: [name] [name], female    DOB: 12/21/1965, 49 y.o.   MRN: [id number]  HPI  CC: diabetes follow up  # Diabetes:  Uncontrolled at new diagnosis 1 month ago  24 hour diet recall: Breakfast coffee, whole wheat bagel low fat cream cheese. Lunch hotdog on a bun. Dinner: baked chicken, stuffing, pinto beans, greens. Breakfast this morning: bacon, egg white, coffee.   Exercise: walking about 1 mile/day up to 2 mile/day. Keeps track with a FitBit  Weight loss: 225lbs down to 217lbs in 1 month  Medication: taking 1000mg  twice a day (for past 2 weeks), had initial GI side effects but have now resolved  Has been checking CBG, highest fasting was 144 and most around 110-130.  ROS: no polyuria, no polydipsia  # Hypertension  Stable  Not currently taking any medications  ROS: no CP, no SOB, no HA  Review of Systems   See HPI for ROS. All other systems reviewed and are negative.  Past medical history, surgical, family, and social history reviewed and updated in the EMR as appropriate. Objective:  BP 138/73 mmHg  Pulse 88  Temp(Src) 98.5 F (36.9 C) (Oral)  Ht 5\' 2"  (1.575 m)  Wt 217 lb (98.431 kg)  BMI 39.68 kg/m2 Vitals and nursing note reviewed  General: NAD CV: RRR nl s1s2 no mrg. 2+ radial, PT pulses bl Resp: CTAB nl effort Ext: no edema.  Neuro: alert and oriented, no deficits noted. Monofilament testing normal both feet, see Quality Metrics tab.  Assessment & Plan:  See Problem List Documentation

## 2014-08-18 NOTE — Telephone Encounter (Signed)
Received [facility] call from [facility] needing a provider that is enrolled in [employer] (medicare) to write Rx for test strips.  Dr. Leveda AnnaHensel has agreed to write for test strips: glucose blood (ONE TOUCH ULTRA TEST) test strip: Check twice daily, fasting in morning and after dinner.  ICD10 E11.65; #100; 3 refills.  [name], Tamika L, RN

## 2014-08-18 NOTE — Telephone Encounter (Signed)
Done

## 2014-08-18 NOTE — Patient Instructions (Signed)
Continue metformin 1000mg  twice daily.  Check your blood sugar twice a day. Fasting in the morning and after dinner. If you have anything consistently over 200, you need to call the clinic and discuss starting an additional medication.  When you feel you are ready, Dr. Gerilyn PilgrimSykes our Dietician is available for visits. Remember that this visit may take 1-2 months to get scheduled.   Diet Recommendations for Diabetes   Starchy (carb) foods include: Bread, rice, pasta, potatoes, corn, crackers, bagels, muffins, all baked goods.  (Fruits, milk, and yogurt also have carbohydrate, but most of these foods will not spike your blood sugar as the starchy foods will.)  A few fruits do cause high blood sugars; use small portions of bananas (limit to 1/2 at a time), grapes, and tropical fruits.    Protein foods include: Meat, fish, poultry, eggs, dairy foods, and beans such as pinto and kidney beans (beans also provide carbohydrate).   1. Eat at least 3 meals and 1-2 snacks per day. Never go more than 4-5 hours while awake without eating.  2. Limit starchy foods to TWO per meal and ONE per snack. ONE portion of a starchy  food is equal to the following:   - ONE slice of bread (or its equivalent, such as half of a hamburger bun).   - 1/2 cup of a "scoopable" starchy food such as potatoes or rice.   - 15 grams of carbohydrate as shown on food label.  3. Both lunch and dinner should include a protein food, a carb food, and vegetables.   - Obtain twice as many veg's as protein or carbohydrate foods for both lunch and dinner.   - Fresh or frozen veg's are best.   - Try to keep frozen veg's on hand for a quick vegetable serving.    4. Breakfast should always include protein.

## 2014-08-19 NOTE — Assessment & Plan Note (Addendum)
Improved but did not do repeat A1c today. Pt has been reporting fasting CBGs that are in excellent range, 110-140. She reports good compliance with metformin. Discussed additional oral agent today however she felt she was doing well with her diet and weight loss and did not want to start. I sent in rx for glucometer and strips, asked to check twice daily and call clinic with any significant elevations >200 and we would then discuss adding another med like glipizide. Also again discussed MNT referral but pt will hold off on this for now. She has a weight loss goal of getting to 175lbs within the next year, which is a little less than 1lb/week. Plan f/u 2 months for repeat A1c.

## 2014-08-19 NOTE — Assessment & Plan Note (Signed)
Stable. Not on medications. At goal today and pt still declines to have additional medication added. Hopeful she may not need to be started on any if she can meet her weight loss goal (already down ~8lbs in a month). F/u 2 months.

## 2014-08-28 ENCOUNTER — Other Ambulatory Visit: Payer: Self-pay | Admitting: Family Medicine

## 2014-10-22 ENCOUNTER — Encounter: Payer: Self-pay | Admitting: Family Medicine

## 2014-10-22 ENCOUNTER — Ambulatory Visit (INDEPENDENT_AMBULATORY_CARE_PROVIDER_SITE_OTHER): Payer: Medicare Other | Admitting: Family Medicine

## 2014-10-22 VITALS — BP 149/84 | HR 86 | Temp 98.6°F | Ht 62.0 in | Wt 213.0 lb

## 2014-10-22 DIAGNOSIS — M5441 Lumbago with sciatica, right side: Secondary | ICD-10-CM

## 2014-10-22 DIAGNOSIS — E1165 Type 2 diabetes mellitus with hyperglycemia: Secondary | ICD-10-CM

## 2014-10-22 DIAGNOSIS — I10 Essential (primary) hypertension: Secondary | ICD-10-CM | POA: Diagnosis not present

## 2014-10-22 DIAGNOSIS — IMO0002 Reserved for concepts with insufficient information to code with codable children: Secondary | ICD-10-CM

## 2014-10-22 LAB — POCT GLYCOSYLATED HEMOGLOBIN (HGB A1C): HEMOGLOBIN A1C: 6.6

## 2014-10-22 MED ORDER — PREGABALIN 50 MG PO CAPS: 50.0000 mg | ORAL_CAPSULE | Freq: Every day | ORAL | Status: DC

## 2014-10-22 MED ORDER — CYCLOBENZAPRINE HCL 10 MG PO TABS: ORAL_TABLET | ORAL | Status: DC

## 2014-10-22 MED ORDER — HYDROCODONE-ACETAMINOPHEN 5-325 MG PO TABS: 1.0000 | ORAL_TABLET | Freq: Four times a day (QID) | ORAL | Status: DC | PRN

## 2014-10-22 NOTE — Progress Notes (Signed)
Patient is due for a screening mammogram.  Information on imaging center given to patient to call and schedule mammogram appointment.  Amos Gaber Dawn, CMA   

## 2014-10-22 NOTE — Progress Notes (Signed)
   Subjective:    Patient ID: [name], female    DOB: 02/07/1966, 49 y.o.   MRN: [id number]  HPI  CC: diabetes follow up  # Diabetes:  Concerned she has recently not been eating as well  Tolerating metformin  CBG: doesn't have any >200. Mostly under 150  Continues to lose some weight ROS: no CP, SOB, HA  # Sciatica  Recently started working again and pain has increased ("7 days on, 7 days off. 2-3 of work days has worsened pain")  Tramadol, exercises she learned at rehab have not been helping entirely anymore. Takes ibuprofen some but not much improvement  While at work says she wears supportive shoes  Has been on gabapentin in the past but did not like how it made her feel,  ROS: no bowel/bladder incontinence  # Blood pressure  Not on any medicines currently  Doesn't want to start medicines if she doesn't have to  Review of Systems   See HPI for ROS. All other systems reviewed and are negative.  Past medical history, surgical, family, and social history reviewed and updated in the EMR as appropriate. Objective:  BP 149/84 mmHg  Pulse 86  Temp(Src) 98.6 F (37 C) (Oral)  Ht 5\' 2"  (1.575 m)  Wt 213 lb (96.616 kg)  BMI 38.95 kg/m2  LMP 09/10/2014 Vitals and nursing note reviewed  General: NAD CV: RRR, nl s1s2, no mrg. 2+ radial pulses bl Resp: CTAB nl effort Ext: SLR testing positive on both sides, left > right.  Assessment & Plan:  See Problem List Documentation

## 2014-10-22 NOTE — Patient Instructions (Addendum)
Diabetes: Your A1c was 6.6 today, that is fantastic! Continue metformin   Pregabalin: Take 1 tablet daily. On days your leg is hurting more, you can take up to 3 times a day. Take the Hydrocodone only when you absolutely cannot walk on the leg because of the pain. This medication will make your pain worse in the long run.  Schedule a nursing visit in 2 weeks for your Blood Pressure. Discuss getting your pneumonia shot at that time!

## 2014-10-24 NOTE — Assessment & Plan Note (Signed)
Exacerbatino since restarting work at [employer]. Continue exercises. Given very short course of hydrocodone (#10) and advised this will contribute to worse pain and to only use when absolutely needs it (ie can't walk or feels like she needs to go to ED). Trial of pregabalin (had been on gabapentin in the past but did not tolerate this) at low dose and will titrate if does okay. F/u as needed.

## 2014-10-24 NOTE — Assessment & Plan Note (Signed)
Not at goal. BP likely elevated in clinic because of anxiety surrounding A1c measurement (first since diagnosis). Pt reluctant to start medicine, she still feels she can improve this with weight loss. Given option to recheck BP with nurse in a few weeks, will call and discuss medicines at that point if elevated. F/u 3 months otherwise.

## 2014-10-24 NOTE — Assessment & Plan Note (Signed)
Significant improvement in A1c from 14 to 6.6 since diagnosis 3 months ago. Pt following dietary recommendations, losing some weight, and on metformin only. Encouraged multiple times during visit to continue current trend with weight loss and diet. No additional agents right now. Pt does seem to be motivated to get off metformin entirely (discussed this would be a less likely event, but not out of the realm of possibilty). F/u 3 months.

## 2014-10-29 ENCOUNTER — Other Ambulatory Visit: Payer: Self-pay | Admitting: Family Medicine

## 2014-11-24 DIAGNOSIS — Z880 Allergy status to penicillin: Secondary | ICD-10-CM | POA: Diagnosis not present

## 2014-11-24 DIAGNOSIS — M064 Inflammatory polyarthropathy: Secondary | ICD-10-CM | POA: Diagnosis not present

## 2014-11-24 DIAGNOSIS — Z79899 Other long term (current) drug therapy: Secondary | ICD-10-CM | POA: Diagnosis not present

## 2014-11-24 DIAGNOSIS — M2550 Pain in unspecified joint: Secondary | ICD-10-CM | POA: Diagnosis not present

## 2014-11-24 DIAGNOSIS — M329 Systemic lupus erythematosus, unspecified: Secondary | ICD-10-CM | POA: Diagnosis not present

## 2014-11-24 DIAGNOSIS — M150 Primary generalized (osteo)arthritis: Secondary | ICD-10-CM | POA: Diagnosis not present

## 2014-11-24 DIAGNOSIS — Z794 Long term (current) use of insulin: Secondary | ICD-10-CM | POA: Diagnosis not present

## 2014-11-24 DIAGNOSIS — E119 Type 2 diabetes mellitus without complications: Secondary | ICD-10-CM | POA: Diagnosis not present

## 2014-11-24 DIAGNOSIS — I313 Pericardial effusion (noninflammatory): Secondary | ICD-10-CM | POA: Diagnosis not present

## 2014-11-24 DIAGNOSIS — M1991 Primary osteoarthritis, unspecified site: Secondary | ICD-10-CM | POA: Diagnosis not present

## 2014-12-17 ENCOUNTER — Other Ambulatory Visit: Payer: Self-pay | Admitting: Family Medicine

## 2014-12-17 MED ORDER — METHOTREXATE 2.5 MG PO TABS: 12.5000 mg | ORAL_TABLET | ORAL | Status: DC

## 2015-01-08 ENCOUNTER — Other Ambulatory Visit: Payer: Self-pay | Admitting: Family Medicine

## 2015-01-08 DIAGNOSIS — IMO0002 Reserved for concepts with insufficient information to code with codable children: Secondary | ICD-10-CM

## 2015-01-08 DIAGNOSIS — E1165 Type 2 diabetes mellitus with hyperglycemia: Secondary | ICD-10-CM

## 2015-01-08 NOTE — Telephone Encounter (Signed)
Patient requests refill for test strips and if it is possible more than one hundred to [facility]. Please, follow up.

## 2015-01-09 NOTE — Telephone Encounter (Signed)
Lm for patient to call back. Jazmin Hartsell,CMA  

## 2015-01-09 NOTE — Telephone Encounter (Signed)
We had tried sending in for 100 test strips. Her diagnosis does not qualify her for more than once a day testing, and it would be my recommendation to test just once a day as well. Can you please call her and let her know this? Thanks.

## 2015-01-12 ENCOUNTER — Telehealth: Payer: Self-pay | Admitting: Family Medicine

## 2015-01-12 NOTE — Telephone Encounter (Signed)
Please see previous note. Jazmin Hartsell,CMA

## 2015-01-12 NOTE — Telephone Encounter (Signed)
Pt called and said to send in whatever amount you can on the test strips. She said that sometimes she does test more or the strip gets messed up. jw

## 2015-01-12 NOTE — Telephone Encounter (Signed)
Pt called and said to send in whatever amount you can on the test strips. She said that sometimes she does test more or the strip gets messed up. jw 

## 2015-01-12 NOTE — Telephone Encounter (Signed)
Pt called and said to send in whatever amount you can on the test strips. She said that sometimes she does test more or the strip gets messed up. jw  Will forward to MD to see if he can just call in 30 day supply of these strips for patient. Jazmin Hartsell,CMA

## 2015-01-22 ENCOUNTER — Other Ambulatory Visit: Payer: Self-pay | Admitting: Family Medicine

## 2015-01-23 MED ORDER — METFORMIN HCL 1000 MG PO TABS: 1000.0000 mg | ORAL_TABLET | Freq: Two times a day (BID) | ORAL | Status: DC

## 2015-03-03 ENCOUNTER — Ambulatory Visit (INDEPENDENT_AMBULATORY_CARE_PROVIDER_SITE_OTHER): Payer: Medicare Other | Admitting: Family Medicine

## 2015-03-03 ENCOUNTER — Encounter: Payer: Self-pay | Admitting: Family Medicine

## 2015-03-03 VITALS — BP 154/88 | HR 86 | Temp 97.8°F | Ht 62.0 in | Wt 208.1 lb

## 2015-03-03 DIAGNOSIS — Z1231 Encounter for screening mammogram for malignant neoplasm of breast: Secondary | ICD-10-CM | POA: Diagnosis not present

## 2015-03-03 DIAGNOSIS — Z1211 Encounter for screening for malignant neoplasm of colon: Secondary | ICD-10-CM | POA: Diagnosis not present

## 2015-03-03 DIAGNOSIS — E118 Type 2 diabetes mellitus with unspecified complications: Secondary | ICD-10-CM

## 2015-03-03 DIAGNOSIS — Z1239 Encounter for other screening for malignant neoplasm of breast: Secondary | ICD-10-CM | POA: Diagnosis not present

## 2015-03-03 DIAGNOSIS — E1165 Type 2 diabetes mellitus with hyperglycemia: Secondary | ICD-10-CM

## 2015-03-03 LAB — POCT GLYCOSYLATED HEMOGLOBIN (HGB A1C): HEMOGLOBIN A1C: 6.2

## 2015-03-03 MED ORDER — VARENICLINE TARTRATE 1 MG PO TABS: 1.0000 mg | ORAL_TABLET | Freq: Two times a day (BID) | ORAL | Status: DC

## 2015-03-03 NOTE — Progress Notes (Addendum)
Patient ID: [name], female   DOB: 03/08/1966, 49 y.o.   MRN: [id number] Subjective:  [name] is a 49 y.o. female here for diabetes follow up. Patient's PMH significant for Lupus and pre-diabetes.  [facility] and she is currently tapering off the methotrexate.  Patient was diagnosed with pre-diabetes earlier this year and is currently controlling diabetes with diet, exercise and Metformin.  Patient checks her blood glucose at home at least twice a day.  She states her highest number in the past 30 days was 143 and her lowest was 103.  She states her fasting average is 120.  Patient does not monitor her blood pressure at home.  Patient states she tries to avoid carbohydrates in her diet and walks "at least," a mile each day for exercise.  Patient states if she eats more carbs than usual, she increases her walking time.  Patient is trying to quit smoking and states she has gone from smoking one pack of cigarettes a day to 4-6 cigarettes a day.  Patient has been using Chantix samples and has been taking 1 mg Chantix twice a day for the past 3 weeks.  Patient denies suicidal thoughts or depression, but states she has had vivid dreams while taking Chantix.  Patient complains of occasional joint pain and aches, which she associates with her Lupus.  Patient denies unwanted weight loss, dizziness, vision changes, syncope, polyuria, nocturia, numbness, polydipsia, chest pain, new wounds, N/V/D, headache, shortness of breath, wheezing, hematuria, dysuria and abdominal or chest pain.  ROS per HPI  Objective: BP 154/88 mmHg  Pulse 86  Temp(Src) 97.8 F (36.6 C) (Oral)  Ht 5\' 2"  (1.575 m)  Wt 208 lb 2 oz (94.405 kg)  BMI 38.06 kg/m2  LMP  (LMP Unknown)  Gen: Pleasant, well-appearing 49 y.o.female in no distress, alert, oriented and cooperative. HEENT: Normocephalic, sclerae/conjunctivae clear, PERRL, MMM, posterior oropharynx clear, adequate dentition Neck:  Neck supple, no masses or lymphadenopathy; thyroid not enlarged  Pulm: Non-labored; CTAB, no wheezes  CV: Regular rate, no murmur appreciated; No LE edema, No JVD GI: Normoactive BS; soft, non-tender, non-distended, no HSM Skin: No wounds or rashes, no acanthosis nigricans Neuro: Sensation intact to light touch, steady gait.   Assessment & Plan: [name] is a 49 y.o. female here for diabetes follow-up.  Patient is very motivated to control her diabetes with diet and exercise.  She controls her carbohydrate intake and walks daily, increasing her activity level if she eats more carbs than usual.  Patient also states she recently began taking Chantix in an attempt to quit smoking and is now down to 4-6 cigarettes a day.  Patient's Lupus is [facility] and they are tapering her methotrexate down currently with the eventual goal of stopping that medication.  Patient's A1C is 6.2 today, which is down slightly from her last reading in June 2016 and she has lost a little over 4 pounds since that time.  Patient's BP was mildly elevated in clinic (@150  SBP).  Discussed this with patient and she is going to ask her roommate to check her BP every other day at home and record the readings.  She was asked to let clinic know if her BP is >140/90 consistently throughout the next several weeks.    For now, we will continue Metformin as prescribed.  Encouraged patient to continue diet and exercise for DM control and congratulated her efforts at smoking cessation.  Discussed  several health maintenance topics with patient today.  She declined both the flu and PNA vaccines, but agreed to have the PNA vaccination next visit.  She also agreed to the Hep C screening, which we will do next time she needs labs drawn.   1. Type 2 diabetes mellitus  Type II diabetes mellitus: Hb A1c:  Lab Results  Component Value Date   HGBA1C 6.2 03/03/2015   - POCT glycosylated hemoglobin (Hb A1C)  2. Encounter  for screening mammogram for breast cancer - Screening for Breast Cancer Referral and instructions given - MM DIGITAL SCREENING BILATERAL; Future  3. Screening for colon cancer - Referral to Gastroenterology for colonoscopy  [facility] (Internal)  Nelly Rout, NP [facility]  Resident attestation: I have seen and examined the patient. The above note reflects any changes I have made. The assessment and plan was developed by me with discussion from [name].   I'm very thrilled with how well [name] is doing. She is extremely motivated with her diabetes and hopes that she can get off the metformin in the future. Discussed that her blood pressure is again elevated, she is able to measure this at home and will call the clinic if it is consistently elevated -- at that time we may need to start medicines. She is also working on quitting smoking, started samples of chantix and has significantly decreased her cigarette use. I have sent in a new prescription for this. I would like to see her back in about 3 months unless her blood pressures do not normalize.   Tawni Carnes, MD 03/04/2015, 11:22 PM PGY-3, [facility]

## 2015-03-03 NOTE — Patient Instructions (Addendum)
Blood pressure: check over the next month, if consistently elevated 140 over 90 you need to come back the clinic. Check every day or every other day. Write the numbers down.  Take the blood pressure sitting, at heart level.  Your A1c today is 6.2, which is "prediabetic" range.  We will refer to colonoscopy.  Get your mammogram.  Pneumonia vaccine next visit.

## 2015-05-26 ENCOUNTER — Ambulatory Visit (INDEPENDENT_AMBULATORY_CARE_PROVIDER_SITE_OTHER): Payer: Medicare Other | Admitting: Family Medicine

## 2015-05-26 ENCOUNTER — Encounter: Payer: Self-pay | Admitting: Family Medicine

## 2015-05-26 ENCOUNTER — Encounter (HOSPITAL_COMMUNITY): Payer: Self-pay | Admitting: Cardiology

## 2015-05-26 ENCOUNTER — Observation Stay (HOSPITAL_COMMUNITY): Payer: Medicare Other

## 2015-05-26 ENCOUNTER — Observation Stay (HOSPITAL_COMMUNITY)
Admission: EM | Admit: 2015-05-26 | Discharge: 2015-05-27 | Disposition: A | Discharge status: Home / Self Care | Payer: Medicare Other | Attending: Family Medicine | Admitting: Family Medicine

## 2015-05-26 ENCOUNTER — Other Ambulatory Visit: Payer: Self-pay

## 2015-05-26 ENCOUNTER — Ambulatory Visit: Payer: Medicare Other | Admitting: Family Medicine

## 2015-05-26 ENCOUNTER — Ambulatory Visit (HOSPITAL_COMMUNITY)
Admission: RE | Admit: 2015-05-26 | Discharge: 2015-05-26 | Disposition: A | Discharge status: Home / Self Care | Payer: Medicare Other | Source: Ambulatory Visit | Attending: Family Medicine | Admitting: Family Medicine

## 2015-05-26 ENCOUNTER — Emergency Department (HOSPITAL_COMMUNITY): Payer: Medicare Other

## 2015-05-26 VITALS — BP 150/83 | HR 89 | Temp 98.1°F | Wt 216.0 lb

## 2015-05-26 DIAGNOSIS — Z79899 Other long term (current) drug therapy: Secondary | ICD-10-CM | POA: Insufficient documentation

## 2015-05-26 DIAGNOSIS — E119 Type 2 diabetes mellitus without complications: Secondary | ICD-10-CM | POA: Diagnosis not present

## 2015-05-26 DIAGNOSIS — I10 Essential (primary) hypertension: Secondary | ICD-10-CM | POA: Diagnosis present

## 2015-05-26 DIAGNOSIS — M329 Systemic lupus erythematosus, unspecified: Secondary | ICD-10-CM | POA: Diagnosis not present

## 2015-05-26 DIAGNOSIS — R7989 Other specified abnormal findings of blood chemistry: Secondary | ICD-10-CM

## 2015-05-26 DIAGNOSIS — M25512 Pain in left shoulder: Secondary | ICD-10-CM

## 2015-05-26 DIAGNOSIS — F17210 Nicotine dependence, cigarettes, uncomplicated: Secondary | ICD-10-CM | POA: Insufficient documentation

## 2015-05-26 DIAGNOSIS — E1165 Type 2 diabetes mellitus with hyperglycemia: Secondary | ICD-10-CM | POA: Diagnosis present

## 2015-05-26 DIAGNOSIS — R0602 Shortness of breath: Secondary | ICD-10-CM | POA: Insufficient documentation

## 2015-05-26 DIAGNOSIS — R079 Chest pain, unspecified: Secondary | ICD-10-CM | POA: Diagnosis not present

## 2015-05-26 DIAGNOSIS — IMO0002 Reserved for concepts with insufficient information to code with codable children: Secondary | ICD-10-CM | POA: Diagnosis present

## 2015-05-26 DIAGNOSIS — R0600 Dyspnea, unspecified: Secondary | ICD-10-CM

## 2015-05-26 DIAGNOSIS — R0789 Other chest pain: Secondary | ICD-10-CM | POA: Diagnosis present

## 2015-05-26 DIAGNOSIS — E669 Obesity, unspecified: Secondary | ICD-10-CM | POA: Diagnosis present

## 2015-05-26 DIAGNOSIS — Z880 Allergy status to penicillin: Secondary | ICD-10-CM | POA: Insufficient documentation

## 2015-05-26 DIAGNOSIS — F17200 Nicotine dependence, unspecified, uncomplicated: Secondary | ICD-10-CM | POA: Diagnosis present

## 2015-05-26 DIAGNOSIS — L930 Discoid lupus erythematosus: Secondary | ICD-10-CM

## 2015-05-26 DIAGNOSIS — M549 Dorsalgia, unspecified: Secondary | ICD-10-CM | POA: Diagnosis not present

## 2015-05-26 HISTORY — DX: Type 2 diabetes mellitus without complications: E11.9

## 2015-05-26 LAB — URINE MICROSCOPIC-ADD ON

## 2015-05-26 LAB — CBC
HCT: 43.4 % (ref 36.0–46.0)
Hemoglobin: 14.7 g/dL (ref 12.0–15.0)
MCH: 28.3 pg (ref 26.0–34.0)
MCHC: 33.9 g/dL (ref 30.0–36.0)
MCV: 83.6 fL (ref 78.0–100.0)
PLATELETS: 256 10*3/uL (ref 150–400)
RBC: 5.19 MIL/uL — ABNORMAL HIGH (ref 3.87–5.11)
RDW: 12.9 % (ref 11.5–15.5)
WBC: 8 10*3/uL (ref 4.0–10.5)

## 2015-05-26 LAB — BASIC METABOLIC PANEL
Anion gap: 10 (ref 5–15)
BUN: 9 mg/dL (ref 6–20)
CALCIUM: 9.8 mg/dL (ref 8.9–10.3)
CO2: 26 mmol/L (ref 22–32)
CREATININE: 0.85 mg/dL (ref 0.44–1.00)
Chloride: 104 mmol/L (ref 101–111)
GFR calc Af Amer: >60 mL/min (ref >60)
GFR calc non Af Amer: >60 mL/min (ref >60)
GLUCOSE: 119 mg/dL — AB (ref 65–99)
Potassium: 4.6 mmol/L (ref 3.5–5.1)
Sodium: 140 mmol/L (ref 135–145)

## 2015-05-26 LAB — URINALYSIS, ROUTINE W REFLEX MICROSCOPIC
BILIRUBIN URINE: NEGATIVE
GLUCOSE, UA: NEGATIVE mg/dL
Hgb urine dipstick: NEGATIVE
KETONES UR: NEGATIVE mg/dL
Nitrite: NEGATIVE
PROTEIN: NEGATIVE mg/dL
Specific Gravity, Urine: 1.025 (ref 1.005–1.030)
pH: 5 (ref 5.0–8.0)

## 2015-05-26 LAB — I-STAT TROPONIN, ED: TROPONIN I, POC: 0 ng/mL (ref 0.00–0.08)

## 2015-05-26 LAB — GLUCOSE, CAPILLARY: Glucose-Capillary: 145 mg/dL — ABNORMAL HIGH (ref 65–99)

## 2015-05-26 LAB — D-DIMER, QUANTITATIVE: D-Dimer, Quant: 0.78 ug/mL-FEU — ABNORMAL HIGH (ref 0.00–0.50)

## 2015-05-26 LAB — TROPONIN I

## 2015-05-26 MED ORDER — NITROGLYCERIN 0.3 MG SL SUBL
0.4000 mg | SUBLINGUAL_TABLET | Freq: Once | SUBLINGUAL | Status: AC
  Administered 2015-05-26: 0.3 mg via SUBLINGUAL

## 2015-05-26 MED ORDER — GI COCKTAIL ~~LOC~~: 30.0000 mL | Freq: Four times a day (QID) | ORAL | Status: DC | PRN

## 2015-05-26 MED ORDER — INSULIN ASPART 100 UNIT/ML ~~LOC~~ SOLN: 0.0000 [IU] | Freq: Every day | SUBCUTANEOUS | Status: DC

## 2015-05-26 MED ORDER — ASPIRIN 325 MG PO TABS
325.0000 mg | ORAL_TABLET | Freq: Once | ORAL | Status: AC
  Administered 2015-05-26: 325 mg via ORAL

## 2015-05-26 MED ORDER — INSULIN ASPART 100 UNIT/ML ~~LOC~~ SOLN: 0.0000 [IU] | Freq: Three times a day (TID) | SUBCUTANEOUS | Status: DC

## 2015-05-26 MED ORDER — ASPIRIN EC 325 MG PO TBEC
325.0000 mg | DELAYED_RELEASE_TABLET | Freq: Every day | ORAL | Status: DC
  Administered 2015-05-27: 325 mg via ORAL
  Filled 2015-05-26: qty 1

## 2015-05-26 MED ORDER — MORPHINE SULFATE (PF) 2 MG/ML IV SOLN
2.0000 mg | INTRAVENOUS | Status: DC | PRN
  Administered 2015-05-26 – 2015-05-27 (×4): 2 mg via INTRAVENOUS
  Filled 2015-05-26 (×4): qty 1

## 2015-05-26 MED ORDER — HYDROXYCHLOROQUINE SULFATE 200 MG PO TABS
200.0000 mg | ORAL_TABLET | Freq: Two times a day (BID) | ORAL | Status: DC
  Administered 2015-05-26 – 2015-05-27 (×2): 200 mg via ORAL
  Filled 2015-05-26 (×2): qty 1

## 2015-05-26 MED ORDER — ONDANSETRON HCL 4 MG/2ML IJ SOLN: 4.0000 mg | Freq: Four times a day (QID) | INTRAMUSCULAR | Status: DC | PRN

## 2015-05-26 MED ORDER — ACETAMINOPHEN 325 MG PO TABS: 650.0000 mg | ORAL_TABLET | ORAL | Status: DC | PRN

## 2015-05-26 MED ORDER — NICOTINE 14 MG/24HR TD PT24
14.0000 mg | MEDICATED_PATCH | Freq: Every day | TRANSDERMAL | Status: DC
  Filled 2015-05-26: qty 1

## 2015-05-26 MED ORDER — HEPARIN SODIUM (PORCINE) 5000 UNIT/ML IJ SOLN
5000.0000 [IU] | Freq: Three times a day (TID) | INTRAMUSCULAR | Status: DC
  Administered 2015-05-26 – 2015-05-27 (×2): 5000 [IU] via SUBCUTANEOUS
  Filled 2015-05-26 (×3): qty 1

## 2015-05-26 MED ORDER — IOHEXOL 350 MG/ML SOLN
90.0000 mL | Freq: Once | INTRAVENOUS | Status: AC | PRN
  Administered 2015-05-26: 90 mL via INTRAVENOUS

## 2015-05-26 NOTE — ED Provider Notes (Addendum)
CSN: [id number]     Arrival date & time 05/26/15  1704 History   First MD Initiated Contact with Patient 05/26/15 2025     Chief Complaint  Patient presents with  . Chest Pain     (Consider location/radiation/quality/duration/timing/severity/associated sxs/prior Treatment) HPI Complains of left-sided posterior chest pain which started yesterday. Pain moved to left anterior chest today. Associated symptoms include shortness of breath with 3 pillow orthopnea since yesterday. She denies cough. She was evaluated at family practice center earlier today, treated with aspirin and sublingual nitroglycerin with relief. Presently she only complains of left-sided parathoracic posterior back pain. No other associated symptoms. Pain is worse with lying supine improved with sitting upright no anterior chest pain presently. Past Medical History  Diagnosis Date  . Substance abuse   . Lupus (HCC)    diabetes hypertension Past Surgical History  Procedure Laterality Date  . Appendectomy    . Tonsillectomy     History reviewed. No pertinent family history. Social History  Substance Use Topics  . Smoking status: Current Every Day Smoker -- 1.00 packs/day for 20 years    Types: Cigarettes  . Smokeless tobacco: None  . Alcohol Use: No   OB History    No data available     Review of Systems  Constitutional: Negative.   HENT: Negative.   Respiratory: Positive for shortness of breath.   Cardiovascular: Positive for chest pain.  Gastrointestinal: Negative.   Musculoskeletal: Negative.   Skin: Negative.   Allergic/Immunologic: Positive for immunocompromised state.       Diabetic  Neurological: Negative.   Psychiatric/Behavioral: Negative.   All other systems reviewed and are negative.     Allergies  Penicillins  Home Medications   Prior to Admission medications   Medication Sig Start Date End Date Taking? Authorizing Provider  acetaminophen (TYLENOL) 500 MG tablet Take 500 mg by mouth  every 6 (six) hours as needed for mild pain, moderate pain or headache.   Yes Historical Provider, MD  albuterol (PROAIR HFA) 108 (90 BASE) MCG/ACT inhaler Inhale 1-2 puffs into the lungs every 6 (six) hours as needed for wheezing or shortness of breath. Frequency:PHARMDIR   Dosage:90   MCG  Instructions:  Note:2 puffs inhl q4-6h prn for wheezing Dose: 90 MCG 11/21/11  Yes Historical Provider, MD  cyclobenzaprine (FLEXERIL) 10 MG tablet TAKE 1 TABLET (10 MG TOTAL) BY MOUTH 3 (THREE) TIMES DAILY AS NEEDED FOR MUSCLE SPASMS. 10/22/14  Yes Nani Ravens, MD  hydroxychloroquine (PLAQUENIL) 200 MG tablet Take 200 mg by mouth 2 (two) times daily.  06/24/14  Yes Historical Provider, MD  metFORMIN (GLUCOPHAGE) 1000 MG tablet Take 1 tablet (1,000 mg total) by mouth 2 (two) times daily with a meal. 01/23/15  Yes Nani Ravens, MD  pregabalin (LYRICA) 50 MG capsule Take 1 capsule (50 mg total) by mouth daily. Patient taking differently: Take 50 mg by mouth 3 (three) times daily as needed (pain from lupus).  10/22/14  Yes Nani Ravens, MD  fluconazole (DIFLUCAN) 150 MG tablet Take 1 tablet (150 mg total) by mouth once. Repeat in 3 days. Patient not taking: Reported on 05/26/2015 06/24/14   Jamal Collin, MD  HYDROcodone-acetaminophen [facility]) 5-325 MG per tablet Take 1 tablet by mouth every 6 (six) hours as needed for moderate pain. Patient not taking: Reported on 05/26/2015 10/22/14   Nani Ravens, MD  ibuprofen (ADVIL,MOTRIN) 800 MG tablet Take 1 tablet (800 mg total) by mouth every 8 (eight) hours as  needed. Patient not taking: Reported on 05/26/2015 01/07/14   Tobey Grim, MD  methotrexate (RHEUMATREX) 2.5 MG tablet Take 5 tablets (12.5 mg total) by mouth once a week. Patient not taking: Reported on 05/26/2015 12/17/14   Nani Ravens, MD  metroNIDAZOLE (FLAGYL) 500 MG tablet Take 1 tablet (500 mg total) by mouth 2 (two) times daily. Patient not taking: Reported on 05/26/2015 04/22/14   Tommie Sams, DO   varenicline (CHANTIX CONTINUING MONTH PAK) 1 MG tablet Take 1 tablet (1 mg total) by mouth 2 (two) times daily. Patient not taking: Reported on 05/26/2015 03/03/15   Nani Ravens, MD   BP 153/88 mmHg  Pulse 89  Temp(Src) 97.6 F (36.4 C) (Oral)  Resp 17  Wt 216 lb (97.977 kg)  SpO2 100%  LMP 05/15/2015 Physical Exam  Constitutional: She is oriented to person, place, and time. She appears well-developed and well-nourished.  HENT:  Head: Normocephalic and atraumatic.  Eyes: Conjunctivae are normal. Pupils are equal, round, and reactive to light.  Neck: Neck supple. No tracheal deviation present. No thyromegaly present.  Cardiovascular: Normal rate and regular rhythm.   No murmur heard. Pulmonary/Chest: Effort normal and breath sounds normal.  Chest is tender at left side posteriorly, reproducing pain exactly. No anterior chest tenderness  Abdominal: Soft. Bowel sounds are normal. She exhibits no distension. There is no tenderness.  Obese  Musculoskeletal: Normal range of motion. She exhibits no edema or tenderness.  Neurological: She is alert and oriented to person, place, and time. Coordination normal.  Skin: Skin is warm and dry. No rash noted.  Psychiatric: She has a normal mood and affect.  Nursing note and vitals reviewed.   ED Course  Procedures (including critical care time) Labs Review Labs Reviewed  BASIC METABOLIC PANEL - Abnormal; Notable for the following:    Glucose, Bld 119 (*)    All other components within normal limits  CBC - Abnormal; Notable for the following:    RBC 5.19 (*)    All other components within normal limits  D-DIMER, QUANTITATIVE (NOT AT [facility])  Rosezena Sensor, ED    Imaging Review Dg Chest 2 View  05/26/2015  CLINICAL DATA:  Chest pain for 3 days. EXAM: CHEST  2 VIEW COMPARISON:  10/07/2010 FINDINGS: The heart size and mediastinal contours are within normal limits. Both lungs are clear. No pleural effusion or pneumothorax. The visualized  skeletal structures are unremarkable. IMPRESSION: No active cardiopulmonary disease. Electronically Signed   By: Amie Portland M.D.   On: 05/26/2015 17:44   I have personally reviewed and evaluated these images and lab results as part of my medical decision-making.   EKG Interpretation None     ED ECG REPORT   Date: 05/26/2015  Rate: 85  Rhythm: normal sinus rhythm  QRS Axis: left  Intervals: normal  ST/T Wave abnormalities: nonspecific T wave changes  Conduction Disutrbances:none  Narrative Interpretation:   Old EKG Reviewed: none available  I have personally reviewed the EKG tracing and agree with the computerized printout as noted. Chest x-ray viewed by me. Results for orders placed or performed during the hospital encounter of 05/26/15  Basic metabolic panel  Result Value Ref Range   Sodium 140 135 - 145 mmol/L   Potassium 4.6 3.5 - 5.1 mmol/L   Chloride 104 101 - 111 mmol/L   CO2 26 22 - 32 mmol/L   Glucose, Bld 119 (H) 65 - 99 mg/dL   BUN 9 6 - 20 mg/dL  Creatinine, Ser 0.85 0.44 - 1.00 mg/dL   Calcium 9.8 8.9 - 16.1 mg/dL   GFR calc non Af Amer >60 >60 mL/min   GFR calc Af Amer >60 >60 mL/min   Anion gap 10 5 - 15  CBC  Result Value Ref Range   WBC 8.0 4.0 - 10.5 K/uL   RBC 5.19 (H) 3.87 - 5.11 MIL/uL   Hemoglobin 14.7 12.0 - 15.0 g/dL   HCT 09.6 04.5 - 40.9 %   MCV 83.6 78.0 - 100.0 fL   MCH 28.3 26.0 - 34.0 pg   MCHC 33.9 30.0 - 36.0 g/dL   RDW 81.1 91.4 - 78.2 %   Platelets 256 150 - 400 K/uL  I-stat troponin, ED (not at [facility], [facility])  Result Value Ref Range   Troponin i, poc 0.00 0.00 - 0.08 ng/mL   Comment 3           Dg Chest 2 View  05/26/2015  CLINICAL DATA:  Chest pain for 3 days. EXAM: CHEST  2 VIEW COMPARISON:  10/07/2010 FINDINGS: The heart size and mediastinal contours are within normal limits. Both lungs are clear. No pleural effusion or pneumothorax. The visualized skeletal structures are unremarkable. IMPRESSION: No active cardiopulmonary  disease. Electronically Signed   By: Amie Portland M.D.   On: 05/26/2015 17:44   Results for orders placed or performed during the hospital encounter of 05/26/15  Basic metabolic panel  Result Value Ref Range   Sodium 140 135 - 145 mmol/L   Potassium 4.6 3.5 - 5.1 mmol/L   Chloride 104 101 - 111 mmol/L   CO2 26 22 - 32 mmol/L   Glucose, Bld 119 (H) 65 - 99 mg/dL   BUN 9 6 - 20 mg/dL   Creatinine, Ser 9.56 0.44 - 1.00 mg/dL   Calcium 9.8 8.9 - 21.3 mg/dL   GFR calc non Af Amer >60 >60 mL/min   GFR calc Af Amer >60 >60 mL/min   Anion gap 10 5 - 15  CBC  Result Value Ref Range   WBC 8.0 4.0 - 10.5 K/uL   RBC 5.19 (H) 3.87 - 5.11 MIL/uL   Hemoglobin 14.7 12.0 - 15.0 g/dL   HCT 08.6 57.8 - 46.9 %   MCV 83.6 78.0 - 100.0 fL   MCH 28.3 26.0 - 34.0 pg   MCHC 33.9 30.0 - 36.0 g/dL   RDW 62.9 52.8 - 41.3 %   Platelets 256 150 - 400 K/uL  I-stat troponin, ED (not at [facility], [facility])  Result Value Ref Range   Troponin i, poc 0.00 0.00 - 0.08 ng/mL   Comment 3           Dg Chest 2 View  05/26/2015  CLINICAL DATA:  Chest pain for 3 days. EXAM: CHEST  2 VIEW COMPARISON:  10/07/2010 FINDINGS: The heart size and mediastinal contours are within normal limits. Both lungs are clear. No pleural effusion or pneumothorax. The visualized skeletal structures are unremarkable. IMPRESSION: No active cardiopulmonary disease. Electronically Signed   By: Amie Portland M.D.   On: 05/26/2015 17:44    MDM  Heart score equals 4. Final diagnoses:  None   case discussed with Dr.Gottschalk. Plan 23 hour observation to telemetry floor. D-dimer is pending. Will be checked on by [facility] and CT angiogram will be ordered from floor if elevated Dx chest pain at rest     Doug Sou, MD 05/26/15 2118  Doug Sou, MD 05/27/15 2440

## 2015-05-26 NOTE — Patient Instructions (Signed)
It was a pleasure seeing you today in our clinic. Today we discussed your back/chest pain. Here is the treatment plan we have discussed and agreed upon together:   - Report to the ED

## 2015-05-26 NOTE — Progress Notes (Signed)
BACK PAIN Left sided, under "shoulder blade". Started Saturday. Initially thought it was gas, took antacids w/o any relief. Making sleeping difficult. Not waking up from pain. HAS woken up from sleep due to "choking feeling". Some nausea. Some sweating. Decreased appetite.   CHEST PAIN Some chest pain -- worse today. Described as "grabbing/pulling" discomfort. Some SOB. Pain is worse w/ deep breathing, direct pressure to site. Nothing makes this better. Felt "winded" after walking 0.58miles to this appt.   Back/chest pain began 3 days ago. Pain is described as burning, catching when I breathe. Patient has tried antacids. Pain radiates no. History of trauma or injury: no Prior history of similar pain: no History of cancer: no Weak immune system:  Yes--h/o lupus History of IV drug use: no History of steroid use: no  Symptoms Incontinence of bowel or bladder:  no Numbness of leg: no Fever: no Rest or Night pain: no Weight Loss:  no Rash: no  ROS see HPI Smoking Status noted.  Objective: BP 150/83 mmHg  Pulse 89  Temp(Src) 98.1 F (36.7 C) (Oral)  Wt 216 lb (97.977 kg) Gen: NAD, alert, cooperative, and pleasant. HEENT: NCAT, EOMI, PERRL, no JVD/bruits CV: RRR, no murmur Resp: CTAB, no wheezes, non-labored Abd: SNTND, BS present, no guarding or organomegaly Ext: No edema, warm Neuro: Alert and oriented, Speech clear, No gross deficits  Assessment and plan:  Chest pain Patient is here presenting with symptoms of left-sided chest pain x3days. Current etiology unknown at this time. Cannot rule out ACS. Patient is complaining of dyspnea on exertion, orthopnea, nausea, diaphoresis. Patient has significant risk factors including hypertension, diabetes, and smoking. Denies any ripping/tearing chest pain. - EKG obtained in clinic: normal sinus rhythm; reassuring overall however patient had artery received 1 tablet metric glycerin at that time. - Patient provided one tablet  nitroglycerin and one chewable aspirin  - She reports that dyspnea and chest discomfort improved slightly 10 minutes after receiving nitroglycerin. - Patient escorted to ED for further workup.  Shoulder pain, left Patient is here with complaints of left-sided shoulder pain 3 days. Pain is located along the inferior border of the left scapula. No radiation. No history of trauma. Cannot rule out any association with the current chest pain. However, pain is reproducible with palpation of the area of concern. Pain is worsened with deep breathing. Etiology currently unknown however muscle skeletal cause is likely due to reproducible nature along the insertion of the latissimus dorsi/rhombus and/or intercostal muscles. - Sending patient to ED for additional workup of chest pain. - If etiology is muscle skeletal nature patient's pain will likely improve over the next 5-7 days. - Rest, Ice, stretching encouraged.    Orders Placed This Encounter  Procedures  . EKG 12-Lead    Meds ordered this encounter  Medications  . aspirin tablet 325 mg    Sig:   . nitroGLYCERIN (NITROSTAT) SL tablet 0.3 mg    Sig:      Kathee Delton, MD,MS,  PGY2 05/26/2015 4:53 PM

## 2015-05-26 NOTE — ED Notes (Signed)
Pt reports that she started having chest pain on Saturday and it has continued to get worse. Brought up by the family practice clinic. States she was given 3 nitro and 324 ASA. Pt received relief with the nitro.

## 2015-05-26 NOTE — Assessment & Plan Note (Addendum)
Patient is here presenting with symptoms of left-sided chest pain x3days. Current etiology unknown at this time. Cannot rule out ACS. Patient is complaining of dyspnea on exertion, orthopnea, nausea, diaphoresis. Patient has significant risk factors including hypertension, diabetes, and smoking. Denies any ripping/tearing chest pain. - EKG obtained in clinic: normal sinus rhythm; reassuring overall however patient had artery received 1 tablet metric glycerin at that time. - Patient provided one tablet nitroglycerin and one chewable aspirin  - She reports that dyspnea and chest discomfort improved slightly 10 minutes after receiving nitroglycerin. - Patient escorted to ED for further workup.

## 2015-05-26 NOTE — Assessment & Plan Note (Addendum)
Patient is here with complaints of left-sided shoulder pain 3 days. Pain is located along the inferior border of the left scapula. No radiation. No history of trauma. Cannot rule out any association with the current chest pain. However, pain is reproducible with palpation of the area of concern. Pain is worsened with deep breathing. Etiology currently unknown however muscle skeletal cause is likely due to reproducible nature along the insertion of the latissimus dorsi/rhombus and/or intercostal muscles. - Sending patient to ED for additional workup of chest pain. - If etiology is muscle skeletal nature patient's pain will likely improve over the next 5-7 days. - Rest, Ice, stretching encouraged.

## 2015-05-26 NOTE — ED Notes (Signed)
Fulton Mole MD at bedside.

## 2015-05-26 NOTE — H&P (Signed)
Family Medicine Teaching [facility] Admission History and Physical Service Pager: [phone]  Patient name: [name] Medical record number: [id number] Date of birth: August 10, 1965 Age: 50 y.o. Gender: female  Primary Care Provider: Tawni Carnes, MD Consultants: cardiology in am Code Status: DNR (discussed on admission)  Chief Complaint: atypical chest pain  Assessment and Plan: [name] is a 50 y.o. female presenting with atypical chest pain . PMH is significant for HTN, T2DM, tobacco use, SLE  # Atypical angina: iTrop neg, EKG with no evidence of ischemia, CXR neg, BMP and CBC unremarkable.  DDimer elevated 0.78.  Denies urinary complaints.  Sounds MSK (has a h/o SLE pain) in nature but has heart score of 4 with co- morbidities. VSS.   - Place in observation under Dr Pollie Meyer for ACS r/o - Telemetry - VS per floor protocol - Cycle trops - EKG in am - consider cards consult if appropriate - Morphine IV prn CP - O2 prn saturations <92% - CTA chest ordered to evaluate for possible PE - Consider NSAIDs once ACS r/o  # L sided back pain: patient with h/o SLE pain.  Denies dysuria. Though pain is at level of kidneys. +CVA tenderness. No white count. Afebrile.   - UA to evaluate for infection. - Consider renal u/s if this is positive to evaluate for pyelo.  # HTN: BP 126/79 on admission.  Not on antihypertensives outpatient - Consider starting ACE-I - Monitor for now.  # T2DM: on Metformin 1000 bid outpatient.  Reports taking Lyrica PRN. - hold metformin, lyrica for now - Sens SSI - CBG monitoring qac, hs  # SLE: On plaquenil outpatient.  Has not taken dose today. CBC unremarkable - Continue home plaquenil - CBC w/ diff in am  # Tobacco use d/o: smokes 1/2 ppd: previously on chantix but ins stopped paying for it. - Nicotine patches   FEN/GI: HH/Carb mod diet Prophylaxis: sub-q heparin  Disposition: Observation for ACS rule out  History of Present  Illness:  [name] is a 50 y.o. female presenting with atypical chest pain  Patient reports that L sided chest pain began yesterday.  She notes pain began as L sided back pain on Saturday.  Pain is described as "pulling" and is a 10/10 at its worst.  Pain is worse with lying on her back.  Not associated with activity.  Relieved by IV meds given in ED to 4/10.  Endorses orthopnea since yesterday.  She notes that it feels similar to when she had an effusion from her SLE several years ago.  Otherwise, no SOB, cough, fever, nausea, vomiting, diarrhea, dysuria, hematuria, polyuria.    Review Of Systems: Per HPI with the following additions: none Otherwise the remainder of the systems were negative.  Patient Active Problem List   Diagnosis Date Noted  . Chest pain 05/26/2015  . Shoulder pain, left 05/26/2015  . Atypical chest pain 05/26/2015  . Diabetes type 2, uncontrolled (HCC) 07/09/2014  . Low back pain with right-sided sciatica 04/22/2014  . Vaginal discharge 04/22/2014  . Microscopic hematuria 03/07/2014  . Trigger thumb of left hand 02/18/2014  . Paronychia 01/08/2014  . Essential hypertension, benign 05/07/2013  . Left hip pain 08/05/2011  . Pain of right lower leg 06/10/2011  . Piriformis syndrome 06/02/2010  . UNSPECIFIED ARTHROPATHY MULTIPLE SITES 01/25/2010  . PERIPHERAL NEUROPATHY 12/18/2009  . Lupus erythematosus 10/01/2009  . Obesity, unspecified 10/15/2008  . TOBACCO ABUSE 08/04/2008    Past Medical History: Past Medical  History  Diagnosis Date  . Substance abuse   . Lupus [facility])     Past Surgical History: Past Surgical History  Procedure Laterality Date  . Appendectomy    . Tonsillectomy      Social History: Social History  Substance Use Topics  . Smoking status: Current Every Day Smoker -- 1.00 packs/day for 20 years    Types: Cigarettes  . Smokeless tobacco: None  . Alcohol Use: No   Additional social history: smoke 1/2 ppd.  Please also refer  to relevant sections of EMR.  Family History: Family History  Problem Relation Age of Onset  . Hypertension Mother    Allergies and Medications: Allergies  Allergen Reactions  . Penicillins     Other reaction(s): RASH REACTION: Rash and hives as child, per pt's mother  Has patient had a PCN reaction causing immediate rash, facial/tongue/throat swelling, SOB or lightheadedness with hypotension: No Has patient had a PCN reaction causing severe rash involving mucus membranes or skin necrosis: No Has patient had a PCN reaction that required hospitalization No Has patient had a PCN reaction occurring within the last 10 years: No If all of the above answers are "NO", then may proceed with Cep   No current facility-administered medications on file prior to encounter.   Current Outpatient Prescriptions on File Prior to Encounter  Medication Sig Dispense Refill  . albuterol (PROAIR HFA) 108 (90 BASE) MCG/ACT inhaler Inhale 1-2 puffs into the lungs every 6 (six) hours as needed for wheezing or shortness of breath. Frequency:PHARMDIR   Dosage:90   MCG  Instructions:  Note:2 puffs inhl q4-6h prn for wheezing Dose: 90 MCG    . cyclobenzaprine (FLEXERIL) 10 MG tablet TAKE 1 TABLET (10 MG TOTAL) BY MOUTH 3 (THREE) TIMES DAILY AS NEEDED FOR MUSCLE SPASMS. 60 tablet 0  . hydroxychloroquine (PLAQUENIL) 200 MG tablet Take 200 mg by mouth 2 (two) times daily.     . metFORMIN (GLUCOPHAGE) 1000 MG tablet Take 1 tablet (1,000 mg total) by mouth 2 (two) times daily with a meal. 180 tablet 3  . pregabalin (LYRICA) 50 MG capsule Take 1 capsule (50 mg total) by mouth daily. (Patient taking differently: Take 50 mg by mouth 3 (three) times daily as needed (pain from lupus). ) 30 capsule 0  . fluconazole (DIFLUCAN) 150 MG tablet Take 1 tablet (150 mg total) by mouth once. Repeat in 3 days. (Patient not taking: Reported on 05/26/2015) 3 tablet 0  . HYDROcodone-acetaminophen (NORCO) 5-325 MG per tablet Take 1 tablet by  mouth every 6 (six) hours as needed for moderate pain. (Patient not taking: Reported on 05/26/2015) 10 tablet 0  . ibuprofen (ADVIL,MOTRIN) 800 MG tablet Take 1 tablet (800 mg total) by mouth every 8 (eight) hours as needed. (Patient not taking: Reported on 05/26/2015) 30 tablet 0  . methotrexate (RHEUMATREX) 2.5 MG tablet Take 5 tablets (12.5 mg total) by mouth once a week. (Patient not taking: Reported on 05/26/2015) 4 tablet   . metroNIDAZOLE (FLAGYL) 500 MG tablet Take 1 tablet (500 mg total) by mouth 2 (two) times daily. (Patient not taking: Reported on 05/26/2015) 14 tablet 0  . varenicline (CHANTIX CONTINUING MONTH PAK) 1 MG tablet Take 1 tablet (1 mg total) by mouth 2 (two) times daily. (Patient not taking: Reported on 05/26/2015) 60 tablet 3    Objective: BP 126/79 mmHg  Pulse 82  Temp(Src) 97.6 F (36.4 C) (Oral)  Resp 23  Wt 216 lb (97.977 kg)  SpO2 100%  LMP 05/15/2015 Exam: General: awake, alert, NAD, sitting up in bed Eyes: sclera white, EOMI ENTM: o/p clear, MMM Neck: no JVD Cardiovascular: RRR, no murmurs Respiratory: slight bibasilar crackles otherwise CTAB, normal work of breathing on RA, no wheeze or rhonchi Abdomen: obese, soft, NT/ND, +BS MSK: No edema, moves extremities independently, decreased movement of LLE compared to RLE.  RLE with large well healed scar, +left sided TTP to ribs T9-11. No ecchymosis or erythema Skin: scar as above, hyperpigmentation of bilateral shoulders at bra line Neuro: no focal deficits, follows commands Psych: speech normal, mood stable  Labs and Imaging: CBC BMET   Recent Labs Lab 05/26/15 1717  WBC 8.0  HGB 14.7  HCT 43.4  PLT 256    Recent Labs Lab 05/26/15 1717  NA 140  K 4.6  CL 104  CO2 26  BUN 9  CREATININE 0.85  GLUCOSE 119*  CALCIUM 9.8     Dg Chest 2 View  05/26/2015  CLINICAL DATA:  Chest pain for 3 days. EXAM: CHEST  2 VIEW COMPARISON:  10/07/2010 FINDINGS: The heart size and mediastinal contours are within  normal limits. Both lungs are clear. No pleural effusion or pneumothorax. The visualized skeletal structures are unremarkable. IMPRESSION: No active cardiopulmonary disease. Electronically Signed   By: Amie Portland M.D.   On: 05/26/2015 17:44   Raliegh Ip, DO 05/26/2015, 10:13 PM PGY-2, [facility] FPTS Intern pager: [phone], text pages welcome

## 2015-05-27 ENCOUNTER — Observation Stay (HOSPITAL_BASED_OUTPATIENT_CLINIC_OR_DEPARTMENT_OTHER): Payer: Medicare Other

## 2015-05-27 ENCOUNTER — Encounter (HOSPITAL_COMMUNITY): Payer: Self-pay | Admitting: General Practice

## 2015-05-27 ENCOUNTER — Other Ambulatory Visit: Payer: Self-pay

## 2015-05-27 DIAGNOSIS — F17210 Nicotine dependence, cigarettes, uncomplicated: Secondary | ICD-10-CM | POA: Diagnosis not present

## 2015-05-27 DIAGNOSIS — E118 Type 2 diabetes mellitus with unspecified complications: Secondary | ICD-10-CM | POA: Diagnosis not present

## 2015-05-27 DIAGNOSIS — L930 Discoid lupus erythematosus: Secondary | ICD-10-CM | POA: Diagnosis not present

## 2015-05-27 DIAGNOSIS — R079 Chest pain, unspecified: Secondary | ICD-10-CM

## 2015-05-27 DIAGNOSIS — R0602 Shortness of breath: Secondary | ICD-10-CM | POA: Diagnosis not present

## 2015-05-27 DIAGNOSIS — R0789 Other chest pain: Secondary | ICD-10-CM

## 2015-05-27 DIAGNOSIS — E669 Obesity, unspecified: Secondary | ICD-10-CM | POA: Diagnosis not present

## 2015-05-27 DIAGNOSIS — E1165 Type 2 diabetes mellitus with hyperglycemia: Secondary | ICD-10-CM

## 2015-05-27 DIAGNOSIS — I10 Essential (primary) hypertension: Secondary | ICD-10-CM | POA: Diagnosis not present

## 2015-05-27 DIAGNOSIS — E119 Type 2 diabetes mellitus without complications: Secondary | ICD-10-CM | POA: Diagnosis not present

## 2015-05-27 LAB — GLUCOSE, CAPILLARY
GLUCOSE-CAPILLARY: 236 mg/dL — AB (ref 65–99)
Glucose-Capillary: 107 mg/dL — ABNORMAL HIGH (ref 65–99)

## 2015-05-27 LAB — CBC WITH DIFFERENTIAL/PLATELET
BASOS ABS: 0 10*3/uL (ref 0.0–0.1)
Basophils Relative: 0 %
EOS PCT: 4 %
Eosinophils Absolute: 0.2 10*3/uL (ref 0.0–0.7)
HEMATOCRIT: 41.8 % (ref 36.0–46.0)
HEMOGLOBIN: 13.8 g/dL (ref 12.0–15.0)
LYMPHS ABS: 2.5 10*3/uL (ref 0.7–4.0)
LYMPHS PCT: 38 %
MCH: 27.8 pg (ref 26.0–34.0)
MCHC: 33 g/dL (ref 30.0–36.0)
MCV: 84.1 fL (ref 78.0–100.0)
Monocytes Absolute: 0.4 10*3/uL (ref 0.1–1.0)
Monocytes Relative: 7 %
NEUTROS ABS: 3.3 10*3/uL (ref 1.7–7.7)
NEUTROS PCT: 51 %
PLATELETS: 236 10*3/uL (ref 150–400)
RBC: 4.97 MIL/uL (ref 3.87–5.11)
RDW: 13.3 % (ref 11.5–15.5)
WBC: 6.4 10*3/uL (ref 4.0–10.5)

## 2015-05-27 LAB — TROPONIN I: Troponin I: <0.03 ng/mL (ref <0.031)

## 2015-05-27 MED ORDER — IBUPROFEN 800 MG PO TABS: 800.0000 mg | ORAL_TABLET | Freq: Four times a day (QID) | ORAL | Status: DC | PRN

## 2015-05-27 NOTE — Progress Notes (Signed)
  Echocardiogram 2D Echocardiogram has been performed.  [name] 05/27/2015, 2:30 PM

## 2015-05-27 NOTE — Consult Note (Signed)
Reason for Consult:   Chest pain  Requesting Physician: Family Practice Primary Cardiologist New  HPI:   50 y/o obese AA female with a history of DM, HTN, and Lupus. She says she saw a cardiologist (Dr Sharyn Lull) in 2011 for what sounds like diastolic CHF. Echo then showed an EF of 55%. The pt presents now with complaints of pain under her Lt shoulder blade that started this past Saturday. She says she took ASA and "muscle relaxer" with no relief. She says when she lays on her back the symptoms are worse and makes her SOB. She denies any radiation to her jaw, neck or arms. She denies any unusual LE edema or DOE. The pt was concerned her symptoms may be related to "fluid" like she had in 2011.   PMHx:  Past Medical History  Diagnosis Date  . Substance abuse   . Lupus [facility])     Past Surgical History  Procedure Laterality Date  . Appendectomy    . Tonsillectomy      SOCHx:  reports that she has been smoking Cigarettes.  She has a 20 pack-year smoking history. She does not have any smokeless tobacco history on file. She reports that she uses illicit drugs (Cocaine). She reports that she does not drink alcohol.  FAMHx: Family History  Problem Relation Age of Onset  . Hypertension Mother     ALLERGIES: Allergies  Allergen Reactions  . Penicillins     Other reaction(s): RASH REACTION: Rash and hives as child, per pt's mother  Has patient had a PCN reaction causing immediate rash, facial/tongue/throat swelling, SOB or lightheadedness with hypotension: No Has patient had a PCN reaction causing severe rash involving mucus membranes or skin necrosis: No Has patient had a PCN reaction that required hospitalization No Has patient had a PCN reaction occurring within the last 10 years: No If all of the above answers are "NO", then may proceed with Cep    ROS: Review of Systems: General: negative for chills, fever, night sweats or weight changes.  Cardiovascular:  negative for dyspnea on exertion, edema, orthopnea, palpitations, paroxysmal nocturnal dyspnea or shortness of breath HEENT: negative for any visual disturbances, blindness, glaucoma Dermatological: negative for rash Respiratory: negative for cough, hemoptysis, or wheezing Urologic: negative for hematuria or dysuria Abdominal: negative for nausea, vomiting, diarrhea, bright red blood per rectum, melena, or hematemesis Neurologic: negative for visual changes, syncope, or dizziness Musculoskeletal: negative for back pain, joint pain, or swelling Psych: cooperative and appropriate All other systems reviewed and are otherwise negative except as noted above.   HOME MEDICATIONS: Prior to Admission medications   Medication Sig Start Date End Date Taking? Authorizing Provider  acetaminophen (TYLENOL) 500 MG tablet Take 500 mg by mouth every 6 (six) hours as needed for mild pain, moderate pain or headache.   Yes Historical Provider, MD  albuterol (PROAIR HFA) 108 (90 BASE) MCG/ACT inhaler Inhale 1-2 puffs into the lungs every 6 (six) hours as needed for wheezing or shortness of breath. Frequency:PHARMDIR   Dosage:90   MCG  Instructions:  Note:2 puffs inhl q4-6h prn for wheezing Dose: 90 MCG 11/21/11  Yes Historical Provider, MD  cyclobenzaprine (FLEXERIL) 10 MG tablet TAKE 1 TABLET (10 MG TOTAL) BY MOUTH 3 (THREE) TIMES DAILY AS NEEDED FOR MUSCLE SPASMS. 10/22/14  Yes Nani Ravens, MD  hydroxychloroquine (PLAQUENIL) 200 MG tablet Take 200 mg by mouth 2 (two) times daily.  06/24/14  Yes Historical Provider, MD  metFORMIN (GLUCOPHAGE) 1000 MG tablet Take 1 tablet (1,000 mg total) by mouth 2 (two) times daily with a meal. 01/23/15  Yes Nani Ravens, MD  pregabalin (LYRICA) 50 MG capsule Take 1 capsule (50 mg total) by mouth daily. Patient taking differently: Take 50 mg by mouth 3 (three) times daily as needed (pain from lupus).  10/22/14  Yes Nani Ravens, MD  fluconazole (DIFLUCAN) 150 MG tablet Take 1  tablet (150 mg total) by mouth once. Repeat in 3 days. Patient not taking: Reported on 05/26/2015 06/24/14   Jamal Collin, MD  HYDROcodone-acetaminophen [facility]) 5-325 MG per tablet Take 1 tablet by mouth every 6 (six) hours as needed for moderate pain. Patient not taking: Reported on 05/26/2015 10/22/14   Nani Ravens, MD  ibuprofen (ADVIL,MOTRIN) 800 MG tablet Take 1 tablet (800 mg total) by mouth every 8 (eight) hours as needed. Patient not taking: Reported on 05/26/2015 01/07/14   Tobey Grim, MD  methotrexate (RHEUMATREX) 2.5 MG tablet Take 5 tablets (12.5 mg total) by mouth once a week. Patient not taking: Reported on 05/26/2015 12/17/14   Nani Ravens, MD  metroNIDAZOLE (FLAGYL) 500 MG tablet Take 1 tablet (500 mg total) by mouth 2 (two) times daily. Patient not taking: Reported on 05/26/2015 04/22/14   Tommie Sams, DO  varenicline (CHANTIX CONTINUING MONTH PAK) 1 MG tablet Take 1 tablet (1 mg total) by mouth 2 (two) times daily. Patient not taking: Reported on 05/26/2015 03/03/15   Nani Ravens, MD    HOSPITAL MEDICATIONS: I have reviewed the patient's current medications.  VITALS: Blood pressure 110/60, pulse 83, temperature 97.5 F (36.4 C), temperature source Oral, resp. rate 20, height 5\' 2"  (1.575 m), weight 215 lb 1.6 oz (97.569 kg), last menstrual period 05/15/2015, SpO2 99 %.  PHYSICAL EXAM: General appearance: alert, cooperative, no distress and morbidly obese Neck: no carotid bruit and no JVD Lungs: clear to auscultation bilaterally Heart: regular rate and rhythm Abdomen: morbidly obese Extremities: no edema Pulses: 2+ and symmetric Skin: Skin color, texture, turgor normal. No rashes or lesions Neurologic: Grossly normal  Back- she is markedly tender to palpation Lt shoulder blade  LABS: Results for orders placed or performed during the hospital encounter of 05/26/15 (from the past 24 hour(s))  Basic metabolic panel     Status: Abnormal   Collection Time:  05/26/15  5:17 PM  Result Value Ref Range   Sodium 140 135 - 145 mmol/L   Potassium 4.6 3.5 - 5.1 mmol/L   Chloride 104 101 - 111 mmol/L   CO2 26 22 - 32 mmol/L   Glucose, Bld 119 (H) 65 - 99 mg/dL   BUN 9 6 - 20 mg/dL   Creatinine, Ser 7.82 0.44 - 1.00 mg/dL   Calcium 9.8 8.9 - 95.6 mg/dL   GFR calc non Af Amer >60 >60 mL/min   GFR calc Af Amer >60 >60 mL/min   Anion gap 10 5 - 15  CBC     Status: Abnormal   Collection Time: 05/26/15  5:17 PM  Result Value Ref Range   WBC 8.0 4.0 - 10.5 K/uL   RBC 5.19 (H) 3.87 - 5.11 MIL/uL   Hemoglobin 14.7 12.0 - 15.0 g/dL   HCT 21.3 08.6 - 57.8 %   MCV 83.6 78.0 - 100.0 fL   MCH 28.3 26.0 - 34.0 pg   MCHC 33.9 30.0 - 36.0 g/dL   RDW 46.9 62.9 - 52.8 %   Platelets  256 150 - 400 K/uL  I-stat troponin, ED (not at [facility], [facility])     Status: None   Collection Time: 05/26/15  5:28 PM  Result Value Ref Range   Troponin i, poc 0.00 0.00 - 0.08 ng/mL   Comment 3          D-dimer, quantitative (not at [facility])     Status: Abnormal   Collection Time: 05/26/15  8:58 PM  Result Value Ref Range   D-Dimer, Quant 0.78 (H) 0.00 - 0.50 ug/mL-FEU  Glucose, capillary     Status: Abnormal   Collection Time: 05/26/15 10:27 PM  Result Value Ref Range   Glucose-Capillary 145 (H) 65 - 99 mg/dL  Troponin I (q 6hr x 3)     Status: None   Collection Time: 05/26/15 10:39 PM  Result Value Ref Range   Troponin I <0.03 <0.031 ng/mL  Urinalysis, Routine w reflex microscopic (not at [facility])     Status: Abnormal   Collection Time: 05/26/15 11:38 PM  Result Value Ref Range   Color, Urine YELLOW YELLOW   APPearance CLEAR CLEAR   Specific Gravity, Urine 1.025 1.005 - 1.030   pH 5.0 5.0 - 8.0   Glucose, UA NEGATIVE NEGATIVE mg/dL   Hgb urine dipstick NEGATIVE NEGATIVE   Bilirubin Urine NEGATIVE NEGATIVE   Ketones, ur NEGATIVE NEGATIVE mg/dL   Protein, ur NEGATIVE NEGATIVE mg/dL   Nitrite NEGATIVE NEGATIVE   Leukocytes, UA SMALL (A) NEGATIVE  Urine microscopic-add on      Status: Abnormal   Collection Time: 05/26/15 11:38 PM  Result Value Ref Range   Squamous Epithelial / LPF 6-30 (A) NONE SEEN   WBC, UA 6-30 0 - 5 WBC/hpf   RBC / HPF 0-5 0 - 5 RBC/hpf   Bacteria, UA FEW (A) NONE SEEN   Casts HYALINE CASTS (A) NEGATIVE  Troponin I (q 6hr x 3)     Status: None   Collection Time: 05/27/15  4:14 AM  Result Value Ref Range   Troponin I <0.03 <0.031 ng/mL  CBC with Differential/Platelet     Status: None   Collection Time: 05/27/15  4:14 AM  Result Value Ref Range   WBC 6.4 4.0 - 10.5 K/uL   RBC 4.97 3.87 - 5.11 MIL/uL   Hemoglobin 13.8 12.0 - 15.0 g/dL   HCT 16.1 09.6 - 04.5 %   MCV 84.1 78.0 - 100.0 fL   MCH 27.8 26.0 - 34.0 pg   MCHC 33.0 30.0 - 36.0 g/dL   RDW 40.9 81.1 - 91.4 %   Platelets 236 150 - 400 K/uL   Neutrophils Relative % 51 %   Neutro Abs 3.3 1.7 - 7.7 K/uL   Lymphocytes Relative 38 %   Lymphs Abs 2.5 0.7 - 4.0 K/uL   Monocytes Relative 7 %   Monocytes Absolute 0.4 0.1 - 1.0 K/uL   Eosinophils Relative 4 %   Eosinophils Absolute 0.2 0.0 - 0.7 K/uL   Basophils Relative 0 %   Basophils Absolute 0.0 0.0 - 0.1 K/uL    EKG: NSR.  IMAGING: Dg Chest 2 View  05/26/2015  CLINICAL DATA:  Chest pain for 3 days. EXAM: CHEST  2 VIEW COMPARISON:  10/07/2010 FINDINGS: The heart size and mediastinal contours are within normal limits. Both lungs are clear. No pleural effusion or pneumothorax. The visualized skeletal structures are unremarkable. IMPRESSION: No active cardiopulmonary disease. Electronically Signed   By: Amie Portland M.D.   On: 05/26/2015 17:44   Ct  Angio Chest Pe W/cm &/or Wo Cm  05/27/2015  CLINICAL DATA:  Back pain EXAM: CT ANGIOGRAPHY CHEST WITH CONTRAST TECHNIQUE: Multidetector CT imaging of the chest was performed using the standard protocol during bolus administration of intravenous contrast. Multiplanar CT image reconstructions and MIPs were obtained to evaluate the vascular anatomy. CONTRAST:  90mL OMNIPAQUE IOHEXOL 350  MG/ML SOLN COMPARISON:  11/26/2009 FINDINGS: Mediastinum: Normal heart size. No pericardial effusion. The trachea appears patent and is midline. Normal appearance of the esophagus. No axillary or supraclavicular adenopathy. The main pulmonary artery is patent. There is no saddle embolus. No lobar or segmental pulmonary artery filling defects identified. Lungs/Pleura: No pleural fluid noted. No airspace consolidation or atelectasis. Upper Abdomen: No suspicious liver abnormality. The visualized portions of the spleen and adrenal glands are unremarkable. Musculoskeletal: The visualized osseous structures are unremarkable. Review of the MIP images confirms the above findings. IMPRESSION: 1. No evidence for acute pulmonary embolus. Electronically Signed   By: Signa Kell M.D.   On: 05/27/2015 00:08    IMPRESSION: Principal Problem:   Atypical chest pain Active Problems:   Lupus erythematosus   Essential hypertension, benign   Diabetes type 2, uncontrolled (HCC)   Obesity (BMI 39)   TOBACCO ABUSE   RECOMMENDATION: Will review with MD, not sure there is enough here to warrant a Myoview, more likely an echo may be more useful.   Time Spent Directly with Patient: [address] minutes  [place], [place]  [phone] beeper 05/27/2015, 6:51 AM    Patient seen and examined. Agree with assessment and plan.  [name] is a 50 year old African-American female who has a history of morbid obesity, hypertension, not on therapy, recently diagnosed diabetes mellitus with remote hemoglobin A1c > 14, and lupus.  She states that she has lost 70 pounds since being told of being diabetic.  In 2011  she had symptoms suggestive of possible diastolic heart failure. This past weekend she was bending over, lifting boxes.  Several days later she began to notice back discomfort yesterday which I suspect is musculoskeletal in etiology.  She denies any exertionally precipitated chest discomfort.  Her physical examination is  notable for obesity.  She has definite tenderness to palpation over the muscular region of her left back just below the scapula.  Initial laboratory is notable for normal troponins.  She has normal renal function with a BUN of 9 and creatinine of 0.85.  She had a normal hemoglobin and hematocrit.  Her d-dimer was mildly elevated and a subsequent CT scan was negative for PE.  Her ECGs have been stable with normal sinus rhythm.  There is a suggestion of probable mild LVH with aVL greater than 11 mm by voltage criteria.  I do not feel her discomfort is ischemic in etiology.  However, with her history of untreated hypertension, possible remote transient diastolic CHF, and probable LVH, I would recommend a 2-D echo Doppler study for further evaluation.  I suspect the patient will be able to go home later today with plans for outpatient follow-up with her family medicine primary M.D.   Lennette Bihari, MD, [facility] 05/27/2015 9:37 AM

## 2015-05-27 NOTE — Discharge Instructions (Signed)
You were admitted to the hospital for chest pain. We have looked at your heart and consulted the cardiologist, your heart does not show any signs of heart attack or damage. It appears as though your pain is musculoskeletal in origin. For pain, please continue taking Ibuprofen, I have sent a prescription for this medicine to your pharmacy. Please take your medicines as described in this packet.  Please follow up with Dr. Waynetta Sandy for a hospital follow up at the appointment time above.  It was a pleasure caring for you, take care!  Diabetes Mellitus and Food It is important for you to manage your blood sugar (glucose) level. Your blood glucose level can be greatly affected by what you eat. Eating healthier foods in the appropriate amounts throughout the day at about the same time each day will help you control your blood glucose level. It can also help slow or prevent worsening of your diabetes mellitus. Healthy eating may even help you improve the level of your blood pressure and reach or maintain a healthy weight.  General recommendations for healthful eating and cooking habits include:  Eating meals and snacks regularly. Avoid going long periods of time without eating to lose weight.  Eating a diet that consists mainly of plant-based foods, such as fruits, vegetables, nuts, legumes, and whole grains.  Using low-heat cooking methods, such as baking, instead of high-heat cooking methods, such as deep frying. Work with your dietitian to make sure you understand how to use the Nutrition Facts information on food labels. HOW CAN FOOD AFFECT ME? Carbohydrates Carbohydrates affect your blood glucose level more than any other type of food. Your dietitian will help you determine how many carbohydrates to eat at each meal and teach you how to count carbohydrates. Counting carbohydrates is important to keep your blood glucose at a healthy level, especially if you are using insulin or taking certain medicines for  diabetes mellitus. Alcohol Alcohol can cause sudden decreases in blood glucose (hypoglycemia), especially if you use insulin or take certain medicines for diabetes mellitus. Hypoglycemia can be a life-threatening condition. Symptoms of hypoglycemia (sleepiness, dizziness, and disorientation) are similar to symptoms of having too much alcohol.  If your health care provider has given you approval to drink alcohol, do so in moderation and use the following guidelines:  Women should not have more than one drink per day, and men should not have more than two drinks per day. One drink is equal to:  12 oz of beer.  5 oz of wine.  1 oz of hard liquor.  Do not drink on an empty stomach.  Keep yourself hydrated. Have water, diet soda, or unsweetened iced tea.  Regular soda, juice, and other mixers might contain a lot of carbohydrates and should be counted. WHAT FOODS ARE NOT RECOMMENDED? As you make food choices, it is important to remember that all foods are not the same. Some foods have fewer nutrients per serving than other foods, even though they might have the same number of calories or carbohydrates. It is difficult to get your body what it needs when you eat foods with fewer nutrients. Examples of foods that you should avoid that are high in calories and carbohydrates but low in nutrients include:  Trans fats (most processed foods list trans fats on the Nutrition Facts label).  Regular soda.  Juice.  Candy.  Sweets, such as cake, pie, doughnuts, and cookies.  Fried foods. WHAT FOODS CAN I EAT? Eat nutrient-rich foods, which will nourish your  body and keep you healthy. The food you should eat also will depend on several factors, including:  The calories you need.  The medicines you take.  Your weight.  Your blood glucose level.  Your blood pressure level.  Your cholesterol level. You should eat a variety of foods, including:  Protein.  Lean cuts of meat.  Proteins low  in saturated fats, such as fish, egg whites, and beans. Avoid processed meats.  Fruits and vegetables.  Fruits and vegetables that may help control blood glucose levels, such as apples, mangoes, and yams.  Dairy products.  Choose fat-free or low-fat dairy products, such as milk, yogurt, and cheese.  Grains, bread, pasta, and rice.  Choose whole grain products, such as multigrain bread, whole oats, and brown rice. These foods may help control blood pressure.  Fats.  Foods containing healthful fats, such as nuts, avocado, olive oil, canola oil, and fish. DOES EVERYONE WITH DIABETES MELLITUS HAVE THE SAME MEAL PLAN? Because every person with diabetes mellitus is different, there is not one meal plan that works for everyone. It is very important that you meet with a dietitian who will help you create a meal plan that is just right for you.   This information is not intended to replace advice given to you by your health care provider. Make sure you discuss any questions you have with your health care provider.   Document Released: 01/06/2005 Document Revised: 05/02/2014 Document Reviewed: 03/08/2013 [employer] Interactive Patient Education [employer]

## 2015-05-27 NOTE — Discharge Summary (Signed)
Family Medicine Teaching [facility] Discharge Summary  Patient name: [name] Medical record number: [id number] Date of birth: 07-06-1965 Age: 50 y.o. Gender: female Date of Admission: 05/26/2015  Date of Discharge: 05/27/15 Admitting Physician: Latrelle Dodrill, MD  Primary Care Provider: Tawni Carnes, MD Consultants: Cardiology  Indication for Hospitalization: Angina  Discharge Diagnoses/Problem List:  Patient Active Problem List   Diagnosis Date Noted  . Chest pain 05/26/2015  . Shoulder pain, left 05/26/2015  . Atypical chest pain 05/26/2015  . Diabetes type 2, uncontrolled (HCC) 07/09/2014  . Low back pain with right-sided sciatica 04/22/2014  . Vaginal discharge 04/22/2014  . Microscopic hematuria 03/07/2014  . Trigger thumb of left hand 02/18/2014  . Paronychia 01/08/2014  . Essential hypertension, benign 05/07/2013  . Left hip pain 08/05/2011  . Pain of right lower leg 06/10/2011  . Piriformis syndrome 06/02/2010  . UNSPECIFIED ARTHROPATHY MULTIPLE SITES 01/25/2010  . PERIPHERAL NEUROPATHY 12/18/2009  . Lupus erythematosus 10/01/2009  . Obesity (BMI 39) 10/15/2008  . TOBACCO ABUSE 08/04/2008    Disposition: home  Discharge Condition: stable  Discharge Exam: see previous progress note  Brief Hospital Course:  [name] is a 50 y.o. female presenting with atypical chest pain . PMH is significant for HTN, T2DM, tobacco use, SLE  Atypical angina:  ACS rule out protocol initiated. Pt given Morphine IV for pain. Troponins negative x 3, EKG showed no evidence of ischemia, and CXR was negative. BMP and CMP unremarkable. D Dimer was elevated to 0.78, CTA negative for PE. Cardiology was consulted. Echocardiogram was performed and revealed grade 1 diastolic dysfunction with EF of 55-60%. It was determined that pain was likely MSK in origin, was transitioned to Ibuprofen for pain.  L sided back pain:  Patient with h/o SLE pain.Denied dysuria but had  +CVA tenderness. CBC WNL. UA showed bacteria but likely unclean catch. Pain improved with Ibuprofen .  T2DM:  Home Metformin 1000 bid held. Was given sensitive sliding scale insulin was given CBG monitoring qac, hs.  All other chronic medical conditions stable throughout admission and managed with home regimens.  Issues for Follow Up:  1. Ensure that chest pain is controlled with Ibuprofen   Significant Procedures: none  Significant Labs and Imaging:   Recent Labs Lab 05/26/15 1717 05/27/15 0414  WBC 8.0 6.4  HGB 14.7 13.8  HCT 43.4 41.8  PLT 256 236    Recent Labs Lab 05/26/15 1717  NA 140  K 4.6  CL 104  CO2 26  GLUCOSE 119*  BUN 9  CREATININE 0.85  CALCIUM 9.8    Dg Chest 2 View  05/26/2015  CLINICAL DATA:  Chest pain for 3 days. EXAM: CHEST  2 VIEW COMPARISON:  10/07/2010 FINDINGS: The heart size and mediastinal contours are within normal limits. Both lungs are clear. No pleural effusion or pneumothorax. The visualized skeletal structures are unremarkable. IMPRESSION: No active cardiopulmonary disease. Electronically Signed   By: Amie Portland M.D.   On: 05/26/2015 17:44   Ct Angio Chest Pe W/cm &/or Wo Cm  05/27/2015  CLINICAL DATA:  Back pain EXAM: CT ANGIOGRAPHY CHEST WITH CONTRAST TECHNIQUE: Multidetector CT imaging of the chest was performed using the standard protocol during bolus administration of intravenous contrast. Multiplanar CT image reconstructions and MIPs were obtained to evaluate the vascular anatomy. CONTRAST:  90mL OMNIPAQUE IOHEXOL 350 MG/ML SOLN COMPARISON:  11/26/2009 FINDINGS: Mediastinum: Normal heart size. No pericardial effusion. The trachea appears patent and is midline. Normal appearance of  the esophagus. No axillary or supraclavicular adenopathy. The main pulmonary artery is patent. There is no saddle embolus. No lobar or segmental pulmonary artery filling defects identified. Lungs/Pleura: No pleural fluid noted. No airspace consolidation or  atelectasis. Upper Abdomen: No suspicious liver abnormality. The visualized portions of the spleen and adrenal glands are unremarkable. Musculoskeletal: The visualized osseous structures are unremarkable. Review of the MIP images confirms the above findings. IMPRESSION: 1. No evidence for acute pulmonary embolus. Electronically Signed   By: Signa Kell M.D.   On: 05/27/2015 00:08     Results/Tests Pending at Time of Discharge: none  Discharge Medications:    Medication List    STOP taking these medications        fluconazole 150 MG tablet  Commonly known as:  DIFLUCAN     HYDROcodone-acetaminophen 5-325 MG tablet  Commonly known as:  NORCO     methotrexate 2.5 MG tablet  Commonly known as:  RHEUMATREX     metroNIDAZOLE 500 MG tablet  Commonly known as:  FLAGYL     varenicline 1 MG tablet  Commonly known as:  CHANTIX CONTINUING MONTH PAK      TAKE these medications        acetaminophen 500 MG tablet  Commonly known as:  TYLENOL  Take 500 mg by mouth every 6 (six) hours as needed for mild pain, moderate pain or headache.     cyclobenzaprine 10 MG tablet  Commonly known as:  FLEXERIL  TAKE 1 TABLET (10 MG TOTAL) BY MOUTH 3 (THREE) TIMES DAILY AS NEEDED FOR MUSCLE SPASMS.     hydroxychloroquine 200 MG tablet  Commonly known as:  PLAQUENIL  Take 200 mg by mouth 2 (two) times daily.  Notes to Patient:  TAKE ONE DOSE TONIGHT     ibuprofen 800 MG tablet  Commonly known as:  ADVIL,MOTRIN  Take 1 tablet (800 mg total) by mouth every 6 (six) hours as needed for mild pain or moderate pain.     metFORMIN 1000 MG tablet  Commonly known as:  GLUCOPHAGE  Take 1 tablet (1,000 mg total) by mouth 2 (two) times daily with a meal.     pregabalin 50 MG capsule  Commonly known as:  LYRICA  Take 1 capsule (50 mg total) by mouth daily.     PROAIR HFA 108 (90 Base) MCG/ACT inhaler  Generic drug:  albuterol  Inhale 1-2 puffs into the lungs every 6 (six) hours as needed for wheezing  or shortness of breath. Frequency:PHARMDIR   Dosage:90   MCG  Instructions:  Note:2 puffs inhl q4-6h prn for wheezing Dose: 90 MCG        Discharge Instructions: Please refer to Patient Instructions section of EMR for full details.  Patient was counseled important signs and symptoms that should prompt return to medical care, changes in medications, dietary instructions, activity restrictions, and follow up appointments.   Follow-Up Appointments:     Follow-up Information    Follow up with Tawni Carnes, MD On 06/01/2015.   Specialty:  Family Medicine   Why:  hospital follow up at 2:45 PM   Contact information:   [address] [place] [address] [phone]       Beaulah Dinning, MD 05/27/2015, 4:00 PM PGY-1, [facility]

## 2015-05-27 NOTE — Care Management Note (Signed)
Case Management Note  Patient Details  Name: [name] MRN: [id number] Date of Birth: 23-Oct-1965  Subjective/Objective:  Pt admitted for chest pain. Pt is from home alone.                   Action/Plan: No needs identified by CM at this time.    Expected Discharge Date:                  Expected Discharge Plan:  Home/Self Care  In-House Referral:  NA  Discharge planning Services  CM Consult  Post Acute Care Choice:  NA Choice offered to:  NA  DME Arranged:  N/A DME Agency:  NA  HH Arranged:  NA HH Agency:  NA  Status of Service:  Completed, signed off  Medicare Important Message Given:    Date Medicare IM Given:    Medicare IM give by:    Date Additional Medicare IM Given:    Additional Medicare Important Message give by:     If discussed at Long Length of Stay Meetings, dates discussed:    Additional Comments:  Gala Lewandowsky, RN 05/27/2015, 12:05 PM

## 2015-05-27 NOTE — Progress Notes (Signed)
Family Medicine Teaching Service Daily Progress Note Intern Pager: [phone]  Patient name: [name] Medical record number: [id number] Date of birth: 23-Aug-1965 Age: 50 y.o. Gender: female  Primary Care Provider: Tawni Carnes, MD Consultants: cardio Code Status: DNR  Pt Overview and Major Events to Date:  1/31: Admit to FPTS  Assessment and Plan: [name] is a 50 y.o. female presenting with atypical chest pain . PMH is significant for HTN, T2DM, tobacco use, SLE  # Atypical angina: Likely MSK in origin. iTrop neg, EKG with no evidence of ischemia, and unchanged this AM. CXR neg, BMP and CBC unremarkable. DDimer elevated 0.78.CTA chest ordered to evaluate for possible PE but was negative. Denies urinary complaints. Sounds MSK (has a h/o SLE pain) in nature but has heart score of 4 with co- morbidities. Trop neg x 2. VSS. - Telemetry - VS per floor protocol - Transition from Morphine IV prn to Ibuprofen - O2 prn saturations <92% - Cardiology consulted, appreciate recommendations  - Echo today and then discharge, no indication for stress test at this time  # L sided back pain: patient with h/o SLE pain. Denies dysuria. Though pain is at level of kidneys. +CVA tenderness. No white count. Afebrile. UA likely unclean catch - Watch for improvement with Ibuprofen   # HTN: Normotensive. Not on antihypertensives outpatient - Monitor for now.  # T2DM: on Metformin 1000 bid outpatient. Reports taking Lyrica PRN. - hold metformin, lyrica for now - Sens SSI - CBG monitoring qac, hs  # SLE: On plaquenil outpatient. Has not taken dose today. CBC unremarkable - Continue home plaquenil  # Tobacco use d/o: smokes 1/2 ppd: previously on chantix but ins stopped paying for it. - Nicotine patches   FEN/GI: HH/Carb mod diet Prophylaxis: sub-q heparin  Disposition: Home  Subjective:  - Patient still has some chest pain this morning - Denies shortness of breath,  dizziness, nausea, diarrhea   Objective: Temp:  [97.5 F (36.4 C)-98.1 F (36.7 C)] 97.5 F (36.4 C) (02/01 0419) Pulse Rate:  [82-89] 83 (02/01 0419) Resp:  [14-25] 20 (02/01 0419) BP: (110-153)/(60-91) 110/60 mmHg (02/01 0419) SpO2:  [99 %-100 %] 99 % (02/01 0419) Weight:  [215 lb 1.6 oz (97.569 kg)-216 lb (97.977 kg)] 215 lb 1.6 oz (97.569 kg) (01/31 2219) Physical Exam: General: awake, alert, NAD, sitting up in bed Cardiovascular: RRR, no murmurs Respiratory: slight bibasilar crackles otherwise CTAB, normal work of breathing on RA, no wheeze or rhonchi Abdomen: obese, soft, NT/ND, +BS MSK: No edema, moves extremities independently, decreased movement of LLE compared to RLE.RLE with large well healed scar, +left sided TTP to ribs T9-11. No ecchymosis or erythema Neuro: no focal deficits, follows commands Psych: speech normal, mood stable  Laboratory:  Recent Labs Lab 05/26/15 1717 05/27/15 0414  WBC 8.0 6.4  HGB 14.7 13.8  HCT 43.4 41.8  PLT 256 236    Recent Labs Lab 05/26/15 1717  NA 140  K 4.6  CL 104  CO2 26  BUN 9  CREATININE 0.85  CALCIUM 9.8  GLUCOSE 119*    Imaging/Diagnostic Tests: Dg Chest 2 View  05/26/2015  CLINICAL DATA:  Chest pain for 3 days. EXAM: CHEST  2 VIEW COMPARISON:  10/07/2010 FINDINGS: The heart size and mediastinal contours are within normal limits. Both lungs are clear. No pleural effusion or pneumothorax. The visualized skeletal structures are unremarkable. IMPRESSION: No active cardiopulmonary disease. Electronically Signed   By: Amie Portland M.D.   On: 05/26/2015  17:44   Ct Angio Chest Pe W/cm &/or Wo Cm  05/27/2015  CLINICAL DATA:  Back pain EXAM: CT ANGIOGRAPHY CHEST WITH CONTRAST TECHNIQUE: Multidetector CT imaging of the chest was performed using the standard protocol during bolus administration of intravenous contrast. Multiplanar CT image reconstructions and MIPs were obtained to evaluate the vascular anatomy. CONTRAST:  90mL  OMNIPAQUE IOHEXOL 350 MG/ML SOLN COMPARISON:  11/26/2009 FINDINGS: Mediastinum: Normal heart size. No pericardial effusion. The trachea appears patent and is midline. Normal appearance of the esophagus. No axillary or supraclavicular adenopathy. The main pulmonary artery is patent. There is no saddle embolus. No lobar or segmental pulmonary artery filling defects identified. Lungs/Pleura: No pleural fluid noted. No airspace consolidation or atelectasis. Upper Abdomen: No suspicious liver abnormality. The visualized portions of the spleen and adrenal glands are unremarkable. Musculoskeletal: The visualized osseous structures are unremarkable. Review of the MIP images confirms the above findings. IMPRESSION: 1. No evidence for acute pulmonary embolus. Electronically Signed   By: Signa Kell M.D.   On: 05/27/2015 00:08    Beaulah Dinning, MD 05/27/2015, 6:53 AM PGY-1, [place] [facility] FPTS Intern pager: [phone], text pages welcome

## 2015-05-27 NOTE — Care Management Obs Status (Signed)
MEDICARE OBSERVATION STATUS NOTIFICATION   Patient Details  Name: [name] MRN: [id number] Date of Birth: [birth year]   Medicare Observation Status Notification Given:  Yes    Gala Lewandowsky, RN 05/27/2015, 12:04 PM

## 2015-06-01 ENCOUNTER — Ambulatory Visit (INDEPENDENT_AMBULATORY_CARE_PROVIDER_SITE_OTHER): Payer: Medicare Other | Admitting: Family Medicine

## 2015-06-01 VITALS — BP 143/71 | HR 89 | Temp 98.5°F | Wt 217.2 lb

## 2015-06-01 DIAGNOSIS — M549 Dorsalgia, unspecified: Secondary | ICD-10-CM | POA: Diagnosis not present

## 2015-06-01 NOTE — Assessment & Plan Note (Signed)
Almost certainly MSK etiology especially with context now remembered by patient (poor form lifting a 50lb box). Recommended continuing NSAID, muscle relaxer, try heating pads after going over some of the exercises on sheet handed out today along with ROM exercises. Encouraged this would likely get better on its own but may take weeks to months. She has follow up with me for her diabetes in 2-3 weeks.

## 2015-06-01 NOTE — Progress Notes (Signed)
   Subjective:    Patient ID: [name], female    DOB: 16-Dec-1965, 50 y.o.   MRN: [id number]  HPI  CC: hospital follow up   # Back pain:  Seen in hospital for chest/back pain. Chest pain resolved  Back pain is left side, mid back under shoulder blade.   Pain initially a sharp pain now more of a burning or dull ache  She says that she now does believe that this was a muscle injury because she now recalls picking up a box (on Saturday prior to admission) that was 45-50 lbs out of the trunk of a car -- admits didn't use great form  She says the muscle relaxer helps her get to sleep but she still sometimes wakes up if she moves in a bad position. Ibuprofen  is helpful somewhat, mostly not helpful  Pain is worse when leaning back against a chair (puts pressure on the sore area) and laying down flat ROS: No urinary symptoms frequency/urgency/burning  Social Hx: current smoker, was using chantix with improvement but now it isn't being covered. She wants to continue chantix  Review of Systems   See HPI for ROS.   Past medical history, surgical, family, and social history reviewed and updated in the EMR as appropriate. Objective:  BP 143/71 mmHg  Pulse 89  Temp(Src) 98.5 F (36.9 C) (Oral)  Wt 217 lb 3.2 oz (98.521 kg)  LMP 05/15/2015 (Exact Date) Vitals and nursing note reviewed  General: no apparent distress  CV: normal rate, regular rhythm, normal heart sounds, 2+ radial pulses bilaterally  Resp: clear to auscultation bilaterally, normal effort Back: TTP mid back on the left, there are areas of point tenderness and muscle spasm from lateral edge of scapula to midline spine. No deformities noted.  Assessment & Plan:  Mid back pain on left side Almost certainly MSK etiology especially with context now remembered by patient (poor form lifting a 50lb box). Recommended continuing NSAID, muscle relaxer, try heating pads after going over some of the exercises on sheet  handed out today along with ROM exercises. Encouraged this would likely get better on its own but may take weeks to months. She has follow up with me for her diabetes in 2-3 weeks.

## 2015-06-04 ENCOUNTER — Other Ambulatory Visit: Payer: Self-pay | Admitting: Family Medicine

## 2015-06-04 MED ORDER — GLUCOSE BLOOD VI STRP
ORAL_STRIP | Status: DC
Start: 2015-06-04 — End: 2016-08-16

## 2015-06-17 DIAGNOSIS — R7989 Other specified abnormal findings of blood chemistry: Secondary | ICD-10-CM | POA: Diagnosis not present

## 2015-06-17 DIAGNOSIS — E119 Type 2 diabetes mellitus without complications: Secondary | ICD-10-CM | POA: Diagnosis not present

## 2015-06-17 DIAGNOSIS — F17210 Nicotine dependence, cigarettes, uncomplicated: Secondary | ICD-10-CM | POA: Diagnosis not present

## 2015-06-17 DIAGNOSIS — G8929 Other chronic pain: Secondary | ICD-10-CM | POA: Diagnosis not present

## 2015-06-17 DIAGNOSIS — M329 Systemic lupus erythematosus, unspecified: Secondary | ICD-10-CM | POA: Diagnosis not present

## 2015-06-17 DIAGNOSIS — Z79899 Other long term (current) drug therapy: Secondary | ICD-10-CM | POA: Diagnosis not present

## 2015-06-18 ENCOUNTER — Ambulatory Visit (INDEPENDENT_AMBULATORY_CARE_PROVIDER_SITE_OTHER): Payer: Medicare Other | Admitting: Family Medicine

## 2015-06-18 ENCOUNTER — Encounter: Payer: Self-pay | Admitting: Family Medicine

## 2015-06-18 VITALS — BP 150/88 | HR 86 | Temp 98.0°F | Ht 62.0 in | Wt 220.0 lb

## 2015-06-18 DIAGNOSIS — M5441 Lumbago with sciatica, right side: Secondary | ICD-10-CM | POA: Diagnosis not present

## 2015-06-18 DIAGNOSIS — E1165 Type 2 diabetes mellitus with hyperglycemia: Secondary | ICD-10-CM

## 2015-06-18 DIAGNOSIS — E118 Type 2 diabetes mellitus with unspecified complications: Secondary | ICD-10-CM | POA: Diagnosis not present

## 2015-06-18 LAB — POCT GLYCOSYLATED HEMOGLOBIN (HGB A1C): Hemoglobin A1C: 7.1

## 2015-06-18 NOTE — Progress Notes (Signed)
Patient ID: [name], female   DOB: August 09, 1965, 50 y.o.   MRN: [id number] Subjective: [name] is a 50 y.o. female here for diabetes follow up.   She was diagnosed with T2 DM in April 2016. Takes Metformin 1,000 mg twice a day, reporting intermittent compliance. She checks her sugars 1 time per day.  Lowest: 120 at fasting with no symptoms. Highest: 180 Fasting: 120 She avoids carbs typically, but over last several months has not, has found some time to exercise, but not walking as much as usual over past several months.  Patient states since the holidays and her last A1C being so good (6.2), she has not eaten as well as she knows she should or exercised as much.  Patient also mentioned ongoing left posterior shoulder pain that is worse when she lays flat on her back and presses shoulder against surface.  She sleeps on her right side at night to avoid the pain.  Patient inquiring about Tramadol and Flexeril prescriptions. She has used them in the past with some relief.  Patient states she also has some occasional pain related to lupus.  - ROS: Denies fever, chills, weight loss, dizziness, vision changes, syncope, polyuria, nocturia, numbness, polydipsia, chest pain, new wounds.  - PMFSH:  Current every day smoker, twice weekly EtOH, no illicit drugs. - Medications: reviewed and updated Past Medical History  Diagnosis Date  . Substance abuse   . Lupus (HCC)   . Diabetes mellitus without complication (HCC)     type 2    Family History  Problem Relation Age of Onset  . Hypertension Mother    Social History   Social History  . Marital Status: Single    Spouse Name: N/A  . Number of Children: N/A  . Years of Education: N/A   Social History Main Topics  . Smoking status: Current Every Day Smoker -- 1.00 packs/day for 20 years    Types: Cigarettes  . Smokeless tobacco: Never Used  . Alcohol Use: No  . Drug Use: Yes    Special: Cocaine  . Sexual Activity: Not Asked    Other Topics Concern  . None   Social History Narrative   Patient Active Problem List   Diagnosis Date Noted  . Chest pain 05/26/2015  . Mid back pain on left side 05/26/2015  . Atypical chest pain 05/26/2015  . Diabetes type 2, uncontrolled (HCC) 07/09/2014  . Low back pain with right-sided sciatica 04/22/2014  . Vaginal discharge 04/22/2014  . Microscopic hematuria 03/07/2014  . Trigger thumb of left hand 02/18/2014  . Paronychia 01/08/2014  . Essential hypertension, benign 05/07/2013  . Left hip pain 08/05/2011  . Pain of right lower leg 06/10/2011  . Piriformis syndrome 06/02/2010  . UNSPECIFIED ARTHROPATHY MULTIPLE SITES 01/25/2010  . PERIPHERAL NEUROPATHY 12/18/2009  . Lupus erythematosus 10/01/2009  . Obesity (BMI 39) 10/15/2008  . TOBACCO ABUSE 08/04/2008   Objective: BP 150/88 mmHg  Pulse 86  Temp(Src) 98 F (36.7 C) (Oral)  Ht  (1.575 m)  Wt 220 lb (99.791 kg)  BMI 40.23 kg/m2  LMP 05/15/2015 (Exact Date) Gen: Well-appearing 50 y.o.female in no distress HEENT: Normocephalic, sclerae/conjunctivae clear, PERRL, MMM, posterior oropharynx clear, adequate dentition Neck: Neck supple, no masses or lymphadenopathy; thyroid not enlarged  Pulm: Non-labored; CTAB, no wheezes  CV: Regular rate, no murmur appreciated; No LE edema, No JVD GI: Normoactive BS; soft, non-tender, non-distended, no HSM Skin: No wounds or rashes, no acanthosis nigricans Neuro: Sensation  intact to light touch, steady gait. Lab Results  Component Value Date   K 4.6 05/26/2015   Lab Results  Component Value Date   CREATININE 0.85 05/26/2015   Lab Results  Component Value Date   HGBA1C 6.2 03/03/2015   Lab Results  Component Value Date   CHOL 187 06/24/2014   HDL 55 06/24/2014   LDLCALC 108* 06/24/2014   TRIG 121 06/24/2014   CHOLHDL 3.4 06/24/2014    Assessment & Plan: [name] is a 50 y.o. female here for diabetes.  While patient's efforts in diet and exercise  seem to have declined, she is committed to re-engaging in those efforts.    Type II diabetes mellitus: Hb A1c:  Lab Results  Component Value Date   HGBA1C 7.1 06/18/2015  Goal Hb A1c: < 7.0%. 7.1% is A1C today with no evidence of end-organ damage. No retinopathy, nephropathy, peripheral neuropathy, autonomic dysfunction, gastroparesis.   Patient continues to have some left posterior shoulder pain and requested prescription for Tramadol and Flexeril.    Patient requested information about assistance buying Chantix because her insurance will not cover it.  We gave patient information to access the manufacturer's website where she can apply for assistance through the pharmaceutical company.    1. Uncontrolled type 2 diabetes mellitus with complication, unspecified long term insulin use status (HCC) - HgB A1c   Nelly Rout, NP Student [facility]  RESIDENT ADDENDUM I have separately seen and examined the patient. I have discussed the findings and exam with the NP student and agree with the above note (edited where needed)  Diabetes: despite her indulgences with diet she actually remains well controlled, A1c was 7.1. She is also up about 14lbs. Again stressed importance of diet and exercise in management of disease  Left shoulder pain: actually appears to be sub scapular, described as sharp only when pushed. There are no movements that make the pain worse. I suspect that she has actually inflammed a nerve as she also describes as burning at times. Agreed with short course of tramadol to be used for severe breakthrough pain, but to try and stick with OTCs and flexeril.  Follow up in 3 months or sooner if needed  Tawni Carnes, MD 06/19/2015, 11:50 PM PGY-3, [facility]

## 2015-06-19 MED ORDER — TRAMADOL HCL 50 MG PO TABS: 50.0000 mg | ORAL_TABLET | Freq: Every day | ORAL | Status: DC | PRN

## 2015-06-19 MED ORDER — CYCLOBENZAPRINE HCL 10 MG PO TABS: ORAL_TABLET | ORAL | Status: DC

## 2015-08-12 ENCOUNTER — Encounter: Payer: Self-pay | Admitting: Family Medicine

## 2015-08-12 ENCOUNTER — Ambulatory Visit (INDEPENDENT_AMBULATORY_CARE_PROVIDER_SITE_OTHER): Payer: Medicare Other | Admitting: Family Medicine

## 2015-08-12 VITALS — BP 174/90 | HR 87 | Temp 98.6°F | Wt 226.9 lb

## 2015-08-12 DIAGNOSIS — M5441 Lumbago with sciatica, right side: Secondary | ICD-10-CM | POA: Diagnosis not present

## 2015-08-12 MED ORDER — HYDROCODONE-ACETAMINOPHEN 10-325 MG PO TABS: 1.0000 | ORAL_TABLET | Freq: Three times a day (TID) | ORAL | Status: DC | PRN

## 2015-08-12 MED ORDER — MELOXICAM 15 MG PO TABS
15.0000 mg | ORAL_TABLET | Freq: Every day | ORAL | Status: DC
Start: 2015-08-12 — End: 2015-09-16

## 2015-08-12 NOTE — Patient Instructions (Signed)
Take a short course of both mobic (an NSAID, which should not be taken at the same time as ibuprofen or aleve or goody's) Take a short course of hydrocodone as needed

## 2015-08-12 NOTE — Progress Notes (Signed)
Subjective: [name] is a 50 y.o. female with a history of bulging disc and sciatica presenting for flare of lower back and right leg pain.   [name] has had 1 month of right leg pain that is intermittent (about 18 hours out of every day), lateral sharp and severe, debilitating. This is consistent with prior flares of pain from her known spinal problems. She denies any trauma.  Exercises and lyrica are not helping like they have in the past. Standing and sitting both worsen the pain. Ambulating with a cane helps but does not alleviate pain. Ibuprofen doesn't seem to help anymore and neither does tramadol for this pain.   - ROS: Pt denies any current bowel/bladder problems, fever, chills, unintentional weight loss, night time awakenings secondary to pain, weakness in one or both legs - Current smoker  Objective: BP 174/90 mmHg  Pulse 87  Temp(Src) 98.6 F (37 C) (Oral)  Wt 226 lb 14.4 oz (102.921 kg)  SpO2 98%  LMP 05/15/2015 Gen: 50 y.o. female in apparent pain Back:  Normal skin. Spine with normal alignment and no step offs. No tenderness to vertebral process palpation. Right SI joint, paraspinal musculature is intensely tender without appreciable spasm. R straight leg raise reproduces ipsilateral shooting pains down leg, L SLR is negative. She has preserved lower back flexion/extension and significant pain limiting FABER.  Neuro:  Sensation and motor function 5/5 bilateral lower extremities. Patellar and achilles DTR's 2+  Assessment/Plan: Kimberly MediciDoris RENEE Weeks is a 50 y.o. female here for right back pain and sciatica without red flags.  Low back pain with right-sided sciatica Unclear trigger, possibly just preogression of L4-5 DDD/disc bulging. Could consider f/u lower back imaging if flares become more frequent.  - Treat with short course of hydrocodone and NSAID (cautioned against overuse with SLE, though prior Cr wnl), continue lyrica.  - Progress back to home exercises as  able.

## 2015-08-12 NOTE — Assessment & Plan Note (Signed)
Unclear trigger, possibly just preogression of L4-5 DDD/disc bulging. Could consider f/u lower back imaging if flares become more frequent.  - Treat with short course of hydrocodone and NSAID (cautioned against overuse with SLE, though prior Cr wnl), continue lyrica.  - Progress back to home exercises as able.

## 2015-08-24 ENCOUNTER — Ambulatory Visit: Payer: Medicare Other | Admitting: Family Medicine

## 2015-09-01 ENCOUNTER — Ambulatory Visit: Payer: Medicare Other | Admitting: Family Medicine

## 2015-09-16 ENCOUNTER — Encounter: Payer: Self-pay | Admitting: Family Medicine

## 2015-09-16 ENCOUNTER — Ambulatory Visit (INDEPENDENT_AMBULATORY_CARE_PROVIDER_SITE_OTHER): Payer: Medicare Other | Admitting: Family Medicine

## 2015-09-16 VITALS — BP 155/75 | HR 90 | Temp 98.3°F | Ht 62.0 in | Wt 221.0 lb

## 2015-09-16 DIAGNOSIS — M79604 Pain in right leg: Secondary | ICD-10-CM | POA: Diagnosis not present

## 2015-09-16 DIAGNOSIS — M791 Myalgia: Secondary | ICD-10-CM | POA: Diagnosis not present

## 2015-09-16 DIAGNOSIS — M79609 Pain in unspecified limb: Secondary | ICD-10-CM | POA: Diagnosis not present

## 2015-09-16 DIAGNOSIS — M7918 Myalgia, other site: Secondary | ICD-10-CM | POA: Insufficient documentation

## 2015-09-16 MED ORDER — PREGABALIN 150 MG PO CAPS: 150.0000 mg | ORAL_CAPSULE | Freq: Every day | ORAL | Status: DC

## 2015-09-16 MED ORDER — NAPROXEN 500 MG PO TABS: 500.0000 mg | ORAL_TABLET | Freq: Two times a day (BID) | ORAL | Status: DC

## 2015-09-16 NOTE — Patient Instructions (Addendum)
Your right buttocks pain is most likely due to hamstring tendinopathy.  - Use heating pad 15-20 minutes to the traps daily as needed, followed by gentle stretching - I have referred you to sports medicine for additional evaluation and consideration of steroid injection - Sports medicine can discuss physical therapy referral or home exercise program with you   Your right leg pain is likely due to pinched nerve due to swelling on your knee - Take your Lyrica 150 mg every night - You can discuss this with sports medicine as well  Follow-up with Dr. Cliffton AstersWhite in one month

## 2015-09-16 NOTE — Assessment & Plan Note (Signed)
Right buttocks pain over ischial tuberosity most likely due to bursitis versus hamstring tendinopathy.  MRI 2013 showed enhancement at issue tuberosity at insertion of hamstring - Naproxen 500 mg twice a day 1 week - Referred to sports medicine, for consideration of ultrasound evaluation and possible steroid injection - We'll defer PT referral versus home exercise program to sports medicine - Recommended heating pad and gentle stretching

## 2015-09-16 NOTE — Assessment & Plan Note (Signed)
Right leg pain greatest over fibular head with positive Tinel's and with numbness into the foot , most likely due to common fibular nerve.  Possible impingement secondary to many mild knee effusion due to recent fall on knee - Lyrica 150 mg daily at bedtime - She may discuss with sports medicine for potential ultrasound evaluation or steroid injection if pain persists

## 2015-09-16 NOTE — Progress Notes (Signed)
  Patient name: Kimberly MediciDoris RENEE Love MRN [id number]  Date of birth: 10/11/1965  CC & HPI:  [name] is a 50 y.o. female presenting today for Right-sided buttocks and leg pain.   Right buttocks pain - She reports right buttocks pain that has been ongoing for the past several years.  She associates this with her "sciatica" reports she gets frequent flares. Describes pain as dull achiness, sometimes radiates down thigh.  - Reports [facility] that helps when she does exercises, but then she stops once the pain is resolved - Denies previous back surgeries - History of sciatica - Denies fever, chills, recent infections - Denies bowel or bladder incontinence or retention, no saddle anesthesia  Right leg pain - Reports sharp pain from her right lateral leg just below her knee radiating to her foot.  She also associates this with her sciatica - Reports falling on her right knee a month ago and this pain has been present since then - Denies foot drop  Smoking History Noted  Objective Findings:  Vitals: BP 155/75 mmHg  Pulse 90  Temp(Src) 98.3 F (36.8 C) (Oral)  Ht 5\' 2"  (1.575 m)  Wt 221 lb (100.245 kg)  BMI 40.41 kg/m2  SpO2 98%  Gen: NAD Back / Buttocks - No lower back tenderness; no spinal process tenderness - Pain over right lower buttocks over ischial tuberosity  - Pain reproduced with hip flexion  - No sciatica radiating pain with straight leg raise; negative cross leg raise - No pain with FABER or FADIR - No pain over greater trochanter or IT band - Strength 4/5 for hip extension.  5/5 for hip flexion Right Leg - Lateral knee pain. Pain greatest over fibular head - Positive Tinel's at fibular head causing sharp, shooting pain to the foot - Foot dorsiflexion, plantar flexion 5/5  Assessment & Plan:   Right buttock pain Right buttocks pain over ischial tuberosity most likely due to bursitis versus hamstring tendinopathy.  MRI 2013  showed enhancement at issue tuberosity at insertion of hamstring - Naproxen 500 mg twice a day 1 week - Referred to sports medicine, for consideration of ultrasound evaluation and possible steroid injection - We'll defer PT referral versus home exercise program to sports medicine - Recommended heating pad and gentle stretching  Right leg pain Right leg pain greatest over fibular head with positive Tinel's and with numbness into the foot , most likely due to common fibular nerve.  Possible impingement secondary to many mild knee effusion due to recent fall on knee - Lyrica 150 mg daily at bedtime - She may discuss with sports medicine for potential ultrasound evaluation or steroid injection if pain persists

## 2015-09-17 LAB — BASIC METABOLIC PANEL WITH GFR
BUN: 9 mg/dL (ref 7–25)
CHLORIDE: 104 mmol/L (ref 98–110)
CO2: 24 mmol/L (ref 20–31)
Calcium: 9.5 mg/dL (ref 8.6–10.4)
Creat: 0.77 mg/dL (ref 0.50–1.05)
GFR, Est African American: >89 mL/min (ref >=60)
GFR, Est Non African American: >89 mL/min (ref >=60)
Glucose, Bld: 109 mg/dL — ABNORMAL HIGH (ref 65–99)
POTASSIUM: 4.2 mmol/L (ref 3.5–5.3)
Sodium: 140 mmol/L (ref 135–146)

## 2015-09-24 ENCOUNTER — Ambulatory Visit (INDEPENDENT_AMBULATORY_CARE_PROVIDER_SITE_OTHER): Payer: Medicare Other | Admitting: Family Medicine

## 2015-09-24 ENCOUNTER — Encounter: Payer: Self-pay | Admitting: Family Medicine

## 2015-09-24 VITALS — BP 154/93 | HR 81 | Temp 98.3°F | Wt 221.0 lb

## 2015-09-24 DIAGNOSIS — E118 Type 2 diabetes mellitus with unspecified complications: Secondary | ICD-10-CM

## 2015-09-24 DIAGNOSIS — M79661 Pain in right lower leg: Secondary | ICD-10-CM | POA: Diagnosis not present

## 2015-09-24 DIAGNOSIS — M791 Myalgia: Secondary | ICD-10-CM | POA: Diagnosis not present

## 2015-09-24 DIAGNOSIS — M7918 Myalgia, other site: Secondary | ICD-10-CM

## 2015-09-24 LAB — POCT GLYCOSYLATED HEMOGLOBIN (HGB A1C): Hemoglobin A1C: 7

## 2015-09-24 MED ORDER — PREDNISONE 50 MG PO TABS: 50.0000 mg | ORAL_TABLET | Freq: Every day | ORAL | Status: DC

## 2015-09-24 NOTE — Assessment & Plan Note (Signed)
Still present. Did not yet get phone call from sports medicine referral (I tried to view this in Epic but didn't find the referral order), I made another referral today. Naproxen was not helping much. Discussed with limited effect from norco would not want to repeat this. Patient requesting additional medicine because of how severe symptoms were yesterday, will rx prednisone burst x 7 days.

## 2015-09-24 NOTE — Progress Notes (Signed)
   Subjective:    Patient ID: [name], female    DOB: 11/05/1965, 50 y.o.   MRN: [id number]  HPI  CC: right leg pain  # Right leg/buttocks pain:  Not improved at all  Lyrica dose increased to 150mg , not improved, just making her sleepy  Naproxen didn't help, doesn't notice this helps, mobic also didn't help.   Norco worked for 1-2 hours.   Tramadol didn't help, muscle relaxers didn't help.   Says thinks she is probably pushing herself too much, symptoms got very severe yesterday and had to lay in bed most of the day ROS: no bowel or bladder incontinence  # Diabetes  Eye exam was done by Dr. Dione BoozeGroat sometime thinks around March  Feels she is doing well with this, still working on diet  Tolerating metformin well ROS: no numbness/tingling of toes, no polyuria, no polydipsia  Social Hx: current every day smoker  Review of Systems   See HPI for ROS.   Past medical history, surgical, family, and social history reviewed and updated in the EMR as appropriate. Objective:  BP 154/93 mmHg  Pulse 81  Temp(Src) 98.3 F (36.8 C) (Oral)  Wt 221 lb (100.245 kg)  SpO2 96%  LMP 09/24/2015 Vitals and nursing note reviewed  General: no apparent distress  MSK: No spinal deformity or tenderness. Moderate tenderness to palpation right buttocks near ischial tuberosity. She has positive SLR testing both legs. Neuro: normal strength testing both legs including hip flexion, leg extension/flexion, foot flexion/extension, hip adduction.   Assessment & Plan:  Right buttock pain Still present. Did not yet get phone call from sports medicine referral (I tried to view this in Epic but didn't find the referral order), I made another referral today. Naproxen was not helping much. Discussed with limited effect from norco would not want to repeat this. Patient requesting additional medicine because of how severe symptoms were yesterday, will rx prednisone burst x 7 days.   Diabetes mellitus  type 2, controlled (HCC) Continues to do well, A1c 7.0. She will continue the metformin. DM foot exam done and is normal. Not on ACEi so will do urine microalbumin screening.    Return if symptoms worsen or fail to improve.

## 2015-09-24 NOTE — Assessment & Plan Note (Signed)
Continues to do well, A1c 7.0. She will continue the metformin. DM foot exam done and is normal. Not on ACEi so will do urine microalbumin screening.

## 2015-09-25 LAB — MICROALBUMIN, URINE: Microalb, Ur: 1.1 mg/dL

## 2015-10-02 ENCOUNTER — Encounter: Payer: Self-pay | Admitting: Sports Medicine

## 2015-10-02 ENCOUNTER — Ambulatory Visit
Admission: RE | Admit: 2015-10-02 | Discharge: 2015-10-02 | Disposition: A | Discharge status: Home / Self Care | Payer: Medicare Other | Source: Ambulatory Visit | Attending: Sports Medicine | Admitting: Sports Medicine

## 2015-10-02 ENCOUNTER — Ambulatory Visit (INDEPENDENT_AMBULATORY_CARE_PROVIDER_SITE_OTHER): Payer: Medicare Other | Admitting: Sports Medicine

## 2015-10-02 VITALS — BP 141/80 | HR 87 | Ht 62.0 in | Wt 221.0 lb

## 2015-10-02 DIAGNOSIS — M5136 Other intervertebral disc degeneration, lumbar region: Secondary | ICD-10-CM | POA: Diagnosis not present

## 2015-10-02 DIAGNOSIS — M79604 Pain in right leg: Secondary | ICD-10-CM

## 2015-10-02 DIAGNOSIS — M5431 Sciatica, right side: Secondary | ICD-10-CM | POA: Diagnosis not present

## 2015-10-02 NOTE — Progress Notes (Signed)
   Subjective:    Patient ID: [name], female    DOB: 08/05/1965, 50 y.o.   MRN: [id number]  HPI chief complaint: Right leg pain  Very pleasant 50 year old female comes in today complaining of 2 months of diffuse right leg pain. Symptoms began after a fall back in April. Her right leg simply gave way and she fell onto the right patella. She had a second episode a couple of days later. Shortly thereafter she began to develop a burning, aching type pain that starts in the right buttock and radiates down the lateral right leg into her toes. Her symptoms are much worse with walking and at night. She has been treated with NSAIDs, Lyrica, narcotic pain medication, cyclobenzaprine, and oral prednisone. She just finished a seven-day prednisone Dosepak and she states that she initially had good symptom relief the first few days but her pain has now returned. Lyrica and cyclobenzaprine also help some. However, cyclobenzaprine makes her sleepy. She denies any groin pain. She has not had any imaging. No change in bowel or bladder. No prior low back surgeries. An MRI of her lumbar spine in April 2013 showed a L5-S1 disc protrusion on the left but no evidence of significant foraminal or spinal stenosis on the right.  Past medical history reviewed Medications reviewed Allergies reviewed    Review of Systems    as above Objective:   Physical Exam  Well-developed, she is some mild distressed due to her pain. Vital signs reviewed  Lumbar spine: Limited range of motion secondary to pain. No tenderness to palpation or percussion along the lumbar midline. Neurological exam: Positive straight leg raise on the right. 4/5 strength with resisted great toe extension on the right compared to 5/5 on the left. Strength is 5/5 with resisted plantarflexion and dorsiflexion bilaterally. Patient has decreased sensation to light touch along the lateral 5 and lateral lower leg. No atrophy. Reflexes are trace but equal  at the patellar tendons bilaterally and 1+ at the Achilles tendons bilaterally. Good pulses. Walking with an antalgic gait due to her pain.      Assessment & Plan:   Right leg pain-rule out lumbar disc herniation  X-rays and an MRI of the lumbar spine specifically to rule out a lumbar disc herniation. Although she did get good symptom relief with a recent prednisone Dosepak, I'm hesitant to repeat this given her well-controlled diabetes. Instead, I would like for her to stick with her muscle relaxers and her Lyrica until I can complete my workup. She is currently ambulating with the assistance of a cane and feels like she would be more steady with a walker. I will give her a prescription for this. Phone follow-up after I review the MRI to delineate further treatment which may include epidural steroid injection, physical therapy, or surgical referral.

## 2015-10-03 ENCOUNTER — Ambulatory Visit
Admission: RE | Admit: 2015-10-03 | Discharge: 2015-10-03 | Disposition: A | Discharge status: Home / Self Care | Payer: Medicare Other | Source: Ambulatory Visit | Attending: Sports Medicine | Admitting: Sports Medicine

## 2015-10-03 DIAGNOSIS — M79604 Pain in right leg: Secondary | ICD-10-CM

## 2015-10-03 DIAGNOSIS — M5126 Other intervertebral disc displacement, lumbar region: Secondary | ICD-10-CM | POA: Diagnosis not present

## 2015-10-06 ENCOUNTER — Telehealth: Payer: Self-pay | Admitting: Sports Medicine

## 2015-10-06 ENCOUNTER — Other Ambulatory Visit: Payer: Self-pay | Admitting: Sports Medicine

## 2015-10-06 DIAGNOSIS — M5441 Lumbago with sciatica, right side: Secondary | ICD-10-CM

## 2015-10-06 DIAGNOSIS — M48061 Spinal stenosis, lumbar region without neurogenic claudication: Secondary | ICD-10-CM

## 2015-10-06 NOTE — Telephone Encounter (Signed)
I spoke with the patient on the phone today after reviewing the x-rays and the MRI of her lumbar spine. She has some right foraminal stenosis at L4-L5 and L5-S1. Both of these levels have progressed since 2013. She is still quite uncomfortable. Therefore, I've recommended that we try a round of lumbar epidural steroid injections and refer her for some physical therapy. I've asked her to notify me after the first epidural and let me know how she is feeling. I explained to her that it sometimes takes 2 or 3 of these epidural injections before symptom resolution.

## 2015-10-06 NOTE — Addendum Note (Signed)
Addended by: Annita BrodMOORE, Lashawnna Lambrecht C on: 10/06/2015 03:55 PM   Modules accepted: Orders

## 2015-10-14 ENCOUNTER — Ambulatory Visit
Admission: RE | Admit: 2015-10-14 | Discharge: 2015-10-14 | Disposition: A | Discharge status: Home / Self Care | Payer: Medicare Other | Source: Ambulatory Visit | Attending: Sports Medicine | Admitting: Sports Medicine

## 2015-10-14 DIAGNOSIS — M48061 Spinal stenosis, lumbar region without neurogenic claudication: Secondary | ICD-10-CM

## 2015-10-14 DIAGNOSIS — M5136 Other intervertebral disc degeneration, lumbar region: Secondary | ICD-10-CM | POA: Diagnosis not present

## 2015-10-14 MED ORDER — IOPAMIDOL (ISOVUE-M 200) INJECTION 41%
1.0000 mL | Freq: Once | INTRAMUSCULAR | Status: AC
  Administered 2015-10-14: 1 mL via EPIDURAL

## 2015-10-14 MED ORDER — METHYLPREDNISOLONE ACETATE 40 MG/ML INJ SUSP (RADIOLOG
120.0000 mg | Freq: Once | INTRAMUSCULAR | Status: AC
  Administered 2015-10-14: 120 mg via EPIDURAL

## 2015-10-14 NOTE — Discharge Instructions (Signed)

## 2015-11-08 ENCOUNTER — Other Ambulatory Visit: Payer: Self-pay | Admitting: Family Medicine

## 2015-12-08 ENCOUNTER — Other Ambulatory Visit: Payer: Self-pay | Admitting: *Deleted

## 2015-12-08 DIAGNOSIS — M5441 Lumbago with sciatica, right side: Secondary | ICD-10-CM

## 2015-12-09 ENCOUNTER — Other Ambulatory Visit: Payer: Self-pay | Admitting: Sports Medicine

## 2015-12-09 DIAGNOSIS — M5441 Lumbago with sciatica, right side: Secondary | ICD-10-CM

## 2015-12-18 ENCOUNTER — Other Ambulatory Visit: Payer: Medicare Other

## 2015-12-20 ENCOUNTER — Encounter (HOSPITAL_COMMUNITY): Payer: Self-pay | Admitting: Emergency Medicine

## 2015-12-20 ENCOUNTER — Emergency Department (HOSPITAL_COMMUNITY)
Admission: EM | Admit: 2015-12-20 | Discharge: 2015-12-20 | Disposition: A | Discharge status: Home / Self Care | Payer: Medicare Other | Attending: Emergency Medicine | Admitting: Emergency Medicine

## 2015-12-20 DIAGNOSIS — M549 Dorsalgia, unspecified: Secondary | ICD-10-CM | POA: Diagnosis not present

## 2015-12-20 DIAGNOSIS — Z79899 Other long term (current) drug therapy: Secondary | ICD-10-CM | POA: Insufficient documentation

## 2015-12-20 DIAGNOSIS — E119 Type 2 diabetes mellitus without complications: Secondary | ICD-10-CM | POA: Diagnosis not present

## 2015-12-20 DIAGNOSIS — Z791 Long term (current) use of non-steroidal anti-inflammatories (NSAID): Secondary | ICD-10-CM | POA: Diagnosis not present

## 2015-12-20 DIAGNOSIS — Z7984 Long term (current) use of oral hypoglycemic drugs: Secondary | ICD-10-CM | POA: Insufficient documentation

## 2015-12-20 DIAGNOSIS — G8929 Other chronic pain: Secondary | ICD-10-CM | POA: Diagnosis not present

## 2015-12-20 DIAGNOSIS — I10 Essential (primary) hypertension: Secondary | ICD-10-CM | POA: Diagnosis not present

## 2015-12-20 DIAGNOSIS — F1490 Cocaine use, unspecified, uncomplicated: Secondary | ICD-10-CM | POA: Insufficient documentation

## 2015-12-20 DIAGNOSIS — F17210 Nicotine dependence, cigarettes, uncomplicated: Secondary | ICD-10-CM | POA: Diagnosis not present

## 2015-12-20 MED ORDER — DIAZEPAM 2 MG PO TABS: 2.0000 mg | ORAL_TABLET | ORAL | 0 refills | Status: DC | PRN

## 2015-12-20 MED ORDER — DIAZEPAM 5 MG PO TABS
10.0000 mg | ORAL_TABLET | Freq: Once | ORAL | Status: AC
  Administered 2015-12-20: 10 mg via ORAL
  Filled 2015-12-20: qty 2

## 2015-12-20 MED ORDER — HYDROMORPHONE HCL 1 MG/ML IJ SOLN
1.0000 mg | Freq: Once | INTRAMUSCULAR | Status: AC
  Administered 2015-12-20: 1 mg via INTRAMUSCULAR
  Filled 2015-12-20: qty 1

## 2015-12-20 MED ORDER — OXYCODONE-ACETAMINOPHEN 5-325 MG PO TABS: 2.0000 | ORAL_TABLET | ORAL | 0 refills | Status: DC | PRN

## 2015-12-20 NOTE — ED Provider Notes (Signed)
[facility]-EMERGENCY DEPT Provider Note   CSN: [id number] Arrival date & time: 12/20/15  1814     History   Chief Complaint Chief Complaint  Patient presents with  . Back Pain    HPI [name] is a 50 y.o. female.  50 year old female history of back pain secondary to disc disease who was supposed have a epidural injection 2 days ago but could make the appointment presents with worsening chronic back pain. No bowel or bladder dysfunction. No saddle anesthesias. Pain characterized as sharp and worse when she stands and walks. Better with rest. Has been unrelieved with home medications. Scheduled to have epidural done next week. Denies any new weakness.      Past Medical History:  Diagnosis Date  . Diabetes mellitus without complication (HCC)    type 2   . Lupus (HCC)   . Substance abuse     Patient Active Problem List   Diagnosis Date Noted  . Right buttock pain 09/16/2015  . Right leg pain 09/16/2015  . Chest pain 05/26/2015  . Mid back pain on left side 05/26/2015  . Atypical chest pain 05/26/2015  . Diabetes mellitus type 2, controlled (HCC) 07/09/2014  . Low back pain with right-sided sciatica 04/22/2014  . Vaginal discharge 04/22/2014  . Microscopic hematuria 03/07/2014  . Trigger thumb of left hand 02/18/2014  . Paronychia 01/08/2014  . Essential hypertension, benign 05/07/2013  . Narrowing of intervertebral disc space 02/27/2013  . Arthritis, degenerative 02/18/2013  . Left hip pain 08/05/2011  . Pain of right lower leg 06/10/2011  . Piriformis syndrome 06/02/2010  . UNSPECIFIED ARTHROPATHY MULTIPLE SITES 01/25/2010  . PERIPHERAL NEUROPATHY 12/18/2009  . Lupus erythematosus 10/01/2009  . Obesity (BMI 39) 10/15/2008  . TOBACCO ABUSE 08/04/2008    Past Surgical History:  Procedure Laterality Date  . APPENDECTOMY    . TONSILLECTOMY      OB History    No data available       Home Medications    Prior to Admission medications   Medication Sig  Start Date End Date Taking? Authorizing Provider  acetaminophen (TYLENOL) 500 MG tablet Take 500 mg by mouth every 6 (six) hours as needed for mild pain, moderate pain or headache.    Historical Provider, MD  albuterol (PROAIR HFA) 108 (90 BASE) MCG/ACT inhaler Inhale 1-2 puffs into the lungs every 6 (six) hours as needed for wheezing or shortness of breath. Frequency:PHARMDIR   Dosage:90   MCG  Instructions:  Note:2 puffs inhl q4-6h prn for wheezing Dose: 90 MCG 11/21/11   Historical Provider, MD  cyclobenzaprine (FLEXERIL) 10 MG tablet TAKE 1 TABLET (10 MG TOTAL) BY MOUTH 3 (THREE) TIMES DAILY AS NEEDED FOR MUSCLE SPASMS. 11/11/15   Pincus Large, DO  glucose blood test strip Check sugar once daily. ICD10 E11.65 06/04/15   Carney Living, MD  hydroxychloroquine (PLAQUENIL) 200 MG tablet Take 200 mg by mouth 2 (two) times daily.  06/24/14   Historical Provider, MD  ibuprofen (ADVIL,MOTRIN) 800 MG tablet Take 1 tablet (800 mg total) by mouth every 6 (six) hours as needed for mild pain or moderate pain. 05/27/15   Beaulah Dinning, MD  meloxicam (MOBIC) 15 MG tablet  08/12/15   Historical Provider, MD  metFORMIN (GLUCOPHAGE) 1000 MG tablet Take 1 tablet (1,000 mg total) by mouth 2 (two) times daily with a meal. 01/23/15   Nani Ravens, MD  naproxen (NAPROSYN) 500 MG tablet Take 1 tablet (500 mg total) by mouth  2 (two) times daily with a meal. 09/16/15   Jamal Collin, MD  predniSONE (DELTASONE) 50 MG tablet Take 1 tablet (50 mg total) by mouth daily with breakfast. 09/24/15   Nani Ravens, MD  pregabalin (LYRICA) 150 MG capsule Take 1 capsule (150 mg total) by mouth at bedtime. 09/16/15   Jamal Collin, MD  traMADol (ULTRAM) 50 MG tablet Take 1 tablet (50 mg total) by mouth daily as needed for severe pain. 06/19/15   Nani Ravens, MD    Family History Family History  Problem Relation Age of Onset  . Hypertension Mother     Social History Social History  Substance Use Topics  . Smoking  status: Current Every Day Smoker    Packs/day: 1.00    Years: 20.00    Types: Cigarettes  . Smokeless tobacco: Never Used  . Alcohol use No     Allergies   Penicillins   Review of Systems Review of Systems  All other systems reviewed and are negative.    Physical Exam Updated Vital Signs BP 123/91 (BP Location: Right Arm)   Pulse 78   Temp 99 F (37.2 C) (Oral)   Resp 16   SpO2 99%   Physical Exam  Constitutional: She is oriented to person, place, and time. She appears well-developed and well-nourished.  Non-toxic appearance. No distress.  HENT:  Head: Normocephalic and atraumatic.  Eyes: Conjunctivae, EOM and lids are normal. Pupils are equal, round, and reactive to light.  Neck: Normal range of motion. Neck supple. No tracheal deviation present. No thyroid mass present.  Cardiovascular: Normal rate, regular rhythm and normal heart sounds.  Exam reveals no gallop.   No murmur heard. Pulmonary/Chest: Effort normal and breath sounds normal. No stridor. No respiratory distress. She has no decreased breath sounds. She has no wheezes. She has no rhonchi. She has no rales.  Abdominal: Soft. Normal appearance and bowel sounds are normal. She exhibits no distension. There is no tenderness. There is no rebound and no CVA tenderness.  Musculoskeletal: Normal range of motion. She exhibits no edema or tenderness.       Back:  Neurological: She is alert and oriented to person, place, and time. She displays no atrophy. No cranial nerve deficit or sensory deficit. GCS eye subscore is 4. GCS verbal subscore is 5. GCS motor subscore is 6.  Skin: Skin is warm and dry. No abrasion and no rash noted.  Psychiatric: She has a normal mood and affect. Her speech is normal and behavior is normal.  Nursing note and vitals reviewed.    ED Treatments / Results  Labs (all labs ordered are listed, but only abnormal results are displayed) Labs Reviewed - No data to display  EKG  EKG  Interpretation None       Radiology No results found.  Procedures Procedures (including critical care time)  Medications Ordered in ED Medications - No data to display   Initial Impression / Assessment and Plan / ED Course  I have reviewed the triage vital signs and the nursing notes.  Pertinent labs & imaging results that were available during my care of the patient were reviewed by me and considered in my medical decision making (see chart for details).  Clinical Course    Patient given dose of pain meds here as well as most lasted feels better. She has no neurological deficits. Will be prescribed short course of opiates as well as given prescription for Valium and follow-up with her  Dr.  Final Clinical Impressions(s) / ED Diagnoses   Final diagnoses:  None    New Prescriptions New Prescriptions   No medications on file     Lorre NickAnthony Kaula Klenke, MD 12/20/15 2243

## 2015-12-20 NOTE — ED Triage Notes (Signed)
Pt c/o worsening on Thursday of ongoing back pain s/t bulging disks. Pt states she cannot walk. No bowel or bladder incontinence.

## 2015-12-20 NOTE — ED Notes (Signed)
No answer in [facility]

## 2015-12-20 NOTE — ED Notes (Signed)
Pt has returned she ask if she was still on the list.

## 2015-12-20 NOTE — ED Notes (Signed)
Pt to desk stating she was leaving, advised pt she was next to go back. Pt states she would only wait 15 more minutes. Pt was seen leaving the lobby.

## 2015-12-22 ENCOUNTER — Telehealth: Payer: Self-pay | Admitting: Obstetrics and Gynecology

## 2015-12-22 NOTE — Telephone Encounter (Signed)
Pt was seen in ED on Sunday for her back pain. She was given  15 percocet and 20 diazapam. She has an epideral scheduled for sept 5.  She needs pain meds to make it to the epidural appt.  No appts with PCP was late Sept. Please advise

## 2015-12-22 NOTE — Telephone Encounter (Signed)
No refills at this time as current amount was intended to last her to her appt. If she runs out and feels like she can not tolerate the pain she should come into clinic to be evaluated and if deemed appropriate provider may give pain medicine. This appears to be a chronic issue that her prior doctors have been treating without narcotics.

## 2015-12-22 NOTE — Telephone Encounter (Signed)
Will forward to MD to advise. Jazmin Hartsell,CMA  

## 2015-12-23 ENCOUNTER — Ambulatory Visit
Admission: RE | Admit: 2015-12-23 | Discharge: 2015-12-23 | Disposition: A | Discharge status: Home / Self Care | Payer: Medicare Other | Source: Ambulatory Visit | Attending: Sports Medicine | Admitting: Sports Medicine

## 2015-12-23 ENCOUNTER — Other Ambulatory Visit: Payer: Self-pay | Admitting: Sports Medicine

## 2015-12-23 VITALS — BP 166/87 | HR 88

## 2015-12-23 DIAGNOSIS — M5441 Lumbago with sciatica, right side: Secondary | ICD-10-CM

## 2015-12-23 DIAGNOSIS — M545 Low back pain: Secondary | ICD-10-CM | POA: Diagnosis not present

## 2015-12-23 MED ORDER — TRIAMCINOLONE ACETONIDE 40 MG/ML IJ SUSP (RADIOLOGY)
40.0000 mg | Freq: Once | INTRAMUSCULAR | Status: AC
  Administered 2015-12-23: 40 mg via EPIDURAL

## 2015-12-23 MED ORDER — IOPAMIDOL (ISOVUE-M 200) INJECTION 41%
1.0000 mL | Freq: Once | INTRAMUSCULAR | Status: AC
  Administered 2015-12-23: 1 mL via EPIDURAL

## 2015-12-23 NOTE — Discharge Instructions (Signed)

## 2015-12-23 NOTE — Telephone Encounter (Signed)
LM for patient to call back.  She can discuss this concern on Friday when she comes in for her appointment with Dr. Lum BabeEniola.  Jake Goodson,CMA

## 2015-12-24 ENCOUNTER — Telehealth: Payer: Self-pay | Admitting: *Deleted

## 2015-12-24 NOTE — Telephone Encounter (Signed)
Pt informed of dr Doroteo Glassmanphelps and [employer] message. Deseree Bruna PotterBlount, CMA

## 2015-12-25 ENCOUNTER — Ambulatory Visit: Payer: Medicare Other | Admitting: Family Medicine

## 2015-12-29 ENCOUNTER — Other Ambulatory Visit: Payer: Medicare Other

## 2016-01-05 ENCOUNTER — Ambulatory Visit: Payer: Medicare Other | Admitting: Obstetrics and Gynecology

## 2016-01-05 NOTE — Progress Notes (Deleted)
     Subjective: No chief complaint on file.    HPI: [name] is a 50 y.o. presenting to clinic today to discuss the following:  # # #  Health Maintenance: ***    ROS noted in HPI.  Past Medical, Surgical, Social, and Family History Reviewed & Updated per EMR. Smoking status - ***   Objective: There were no vitals taken for this visit. Vitals and nursing notes reviewed  Physical Exam   No results found for this or any previous visit (from the past 72 hour(s)).  Assessment/Plan: Please see problem based Assessment and Plan  Health Maintainance:   No orders of the defined types were placed in this encounter.   No orders of the defined types were placed in this encounter.    Caryl AdaJazma Rc Amison, DO 01/05/2016, 10:26 AM PGY-3, [facility]

## 2016-01-08 ENCOUNTER — Other Ambulatory Visit: Payer: Self-pay | Admitting: Sports Medicine

## 2016-01-08 ENCOUNTER — Telehealth: Payer: Self-pay | Admitting: *Deleted

## 2016-01-08 DIAGNOSIS — M5441 Lumbago with sciatica, right side: Secondary | ICD-10-CM

## 2016-01-08 NOTE — Telephone Encounter (Signed)
Per Dr. Margaretha Sheffieldraper, [facility] to order a 3rd ESI

## 2016-01-18 ENCOUNTER — Encounter: Payer: Self-pay | Admitting: Obstetrics and Gynecology

## 2016-01-18 ENCOUNTER — Other Ambulatory Visit: Payer: Medicare Other

## 2016-01-18 ENCOUNTER — Ambulatory Visit (INDEPENDENT_AMBULATORY_CARE_PROVIDER_SITE_OTHER): Payer: Medicare Other | Admitting: Obstetrics and Gynecology

## 2016-01-18 VITALS — BP 140/74 | HR 90 | Temp 98.2°F | Wt 212.2 lb

## 2016-01-18 DIAGNOSIS — M5441 Lumbago with sciatica, right side: Secondary | ICD-10-CM | POA: Diagnosis present

## 2016-01-18 MED ORDER — OXYCODONE-ACETAMINOPHEN 5-325 MG PO TABS: 1.0000 | ORAL_TABLET | Freq: Four times a day (QID) | ORAL | 0 refills | Status: DC | PRN

## 2016-01-18 NOTE — Progress Notes (Signed)
     Subjective: Chief Complaint  Patient presents with  . Follow-up    Chronic Back pain     HPI: Kimberly MediciDoris RENEE Barbar is a 50 y.o. presenting to clinic today to discuss the following:  #Back Pain Has had chronic back pain that started in April She was noted to have bulging disc on imaging Receiving steroid injections into back with most recent one being on 8/30 and next injection tomorrow Patient states back pain is worsening. Injections nor medications are helping She is no longer working as of 3 weeks ago and states she has a poor quality of life Has had to go to ED a couple times for pain Last time she went to ED received percocet and valium for pain; she wants to discuss refills of this today Rates pain as 8/10 Denies any recent injury or trauma States that they are now considering surgery for her back pain Describes pain as sharp, constant, stabbing pain with radiation down her right leg. Intermittent spasms  Of note, patient did not want to discuss her other medical problems at this time and stated she will make an appointment.     ROS noted in HPI.  Past Medical, Surgical, Social, and Family History Reviewed & Updated per EMR. Smoking status - Smoker   Objective: BP 140/74 (BP Location: Right Arm, Patient Position: Sitting, Cuff Size: Large)   Pulse 90   Temp 98.2 F (36.8 C) (Oral)   Wt 212 lb 3.2 oz (96.3 kg)   LMP 10/26/2015 (Within Weeks) Comment: intermittent  BMI 38.81 kg/m   Vitals and nursing notes reviewed  Physical Exam  Constitutional: She is well-developed, well-nourished, and in no distress.  MSK: Limited range of motion to lumbar spine secondary to pain. No tenderness to palpation or percussion along the lumbar midline. Tenderness to palpation of right sciatic nerve. Neurological exam: Positive straight leg raise on the right. 4/5 strength on the right compared to 5/5 on the left. Decreased sensation to light touch along the lateral lower leg. No  atrophy. Good pulses. Walking with an antalgic gait with cane due to her pain.  Assessment/Plan: Please see problem based Assessment and Plan   Orders Placed This Encounter  Procedures  . Ambulatory referral to Pain Clinic    Referral Priority:   Routine    Referral Type:   Consultation    Referral Reason:   Specialty Services Required    Requested Specialty:   Pain Medicine    Number of Visits Requested:   1    Meds ordered this encounter  Medications  . oxyCODONE-acetaminophen (PERCOCET/ROXICET) 5-325 MG tablet    Sig: Take 1 tablet by mouth every 6 (six) hours as needed for severe pain.    Dispense:  45 tablet    Refill:  0    Caryl AdaJazma Ruhee Enck, DO 01/18/2016, 2:01 PM PGY-3, [facility]

## 2016-01-18 NOTE — Patient Instructions (Signed)
Ms. [name] it was great to meet you today!  Here are some of the things we discussed today: -referral placed for pain clinic -follow-up with back injection tomorrow -percocet refilled. Take as needed for sever pain -pain regimen discussed today included: lyrica at night, meloxicam, and flexeril and percocet as needed  Please schedule a follow-up appointment soon for follow-up of other chronic diseases   Thanks for allowing me to be a part of your care! Dr. Doroteo GlassmanPhelps

## 2016-01-18 NOTE — Assessment & Plan Note (Signed)
Worsening of back pain at this time. Unclear trigger. May just be progression of DJD and bulging disc of L4-S1. Patient follows with IR for injections. Next injection tomorrow. Will treat with short course of percocet. Continue lyrica at bedtime, mobic daily, and flexeril and percocet as needed. Cleaned up medication list Pain clinic referral made.

## 2016-01-18 NOTE — Progress Notes (Signed)
Pt states she is here for the following:  Follow up for Chronic Back Pain Medication Refill   Pt states she is schedule for ESJ tomorrow for her back pain. She states if this does not work she will be discussing surgery options with Sports Medicine.  Pt states she needs medication refill on Percocet and Valium

## 2016-01-19 ENCOUNTER — Ambulatory Visit
Admission: RE | Admit: 2016-01-19 | Discharge: 2016-01-19 | Disposition: A | Discharge status: Home / Self Care | Payer: Medicare Other | Source: Ambulatory Visit | Attending: Sports Medicine | Admitting: Sports Medicine

## 2016-01-19 DIAGNOSIS — M545 Low back pain: Secondary | ICD-10-CM | POA: Diagnosis not present

## 2016-01-19 DIAGNOSIS — M5441 Lumbago with sciatica, right side: Secondary | ICD-10-CM

## 2016-01-19 MED ORDER — IOPAMIDOL (ISOVUE-M 200) INJECTION 41%
1.0000 mL | Freq: Once | INTRAMUSCULAR | Status: AC
  Administered 2016-01-19: 1 mL via EPIDURAL

## 2016-01-19 MED ORDER — METHYLPREDNISOLONE ACETATE 40 MG/ML INJ SUSP (RADIOLOG
120.0000 mg | Freq: Once | INTRAMUSCULAR | Status: AC
  Administered 2016-01-19: 120 mg via EPIDURAL

## 2016-01-19 NOTE — Discharge Instructions (Signed)

## 2016-01-25 ENCOUNTER — Other Ambulatory Visit: Payer: Self-pay | Admitting: *Deleted

## 2016-01-26 MED ORDER — MELOXICAM 15 MG PO TABS: 15.0000 mg | ORAL_TABLET | Freq: Every day | ORAL | 2 refills | Status: DC

## 2016-02-03 ENCOUNTER — Telehealth: Payer: Self-pay | Admitting: Obstetrics and Gynecology

## 2016-02-05 ENCOUNTER — Encounter: Payer: Self-pay | Admitting: Obstetrics and Gynecology

## 2016-02-05 ENCOUNTER — Ambulatory Visit (INDEPENDENT_AMBULATORY_CARE_PROVIDER_SITE_OTHER): Payer: Medicare Other | Admitting: Obstetrics and Gynecology

## 2016-02-05 VITALS — BP 140/84 | HR 111 | Temp 98.2°F | Ht 62.0 in | Wt 212.0 lb

## 2016-02-05 DIAGNOSIS — G8929 Other chronic pain: Secondary | ICD-10-CM

## 2016-02-05 DIAGNOSIS — M5441 Lumbago with sciatica, right side: Secondary | ICD-10-CM | POA: Diagnosis present

## 2016-02-05 DIAGNOSIS — R000 Tachycardia, unspecified: Secondary | ICD-10-CM | POA: Diagnosis not present

## 2016-02-05 HISTORY — DX: Tachycardia, unspecified: R00.0

## 2016-02-05 MED ORDER — CYCLOBENZAPRINE HCL 10 MG PO TABS: ORAL_TABLET | ORAL | 0 refills | Status: DC

## 2016-02-05 MED ORDER — OXYCODONE-ACETAMINOPHEN 5-325 MG PO TABS: 1.0000 | ORAL_TABLET | Freq: Four times a day (QID) | ORAL | 0 refills | Status: DC | PRN

## 2016-02-05 NOTE — Progress Notes (Signed)
     Subjective: Chief Complaint  Patient presents with  . Medication Refill  . Knee Pain    right      HPI: [name] is a 50 y.o. presenting to clinic today to discuss the following:  #Back Pain Has had chronic back pain that started in April She was noted to have bulging disc on imaging Receiving steroid injections into back  Injections nor medications are helping She is no longer working as of 3 weeks ago and states she has a poor quality of life Rates pain as 8/10 Pain mainly with the radicular symptoms and not so much in her back. Radiating to right knee now considering surgery for her back pain Describes pain as sharp, constant, stabbing pain with radiation down her right leg. Intermittent spasms Has not been seen by pain clinic yet follow-up with sports medicine Monday  ROS noted in HPI.  Past Medical, Surgical, Social, and Family History Reviewed & Updated per EMR. Smoking status - Smoker   Objective: BP 140/84 (BP Location: Left Arm, Cuff Size: Normal)   Pulse (!) 111   Temp 98.2 F (36.8 C) (Oral)   Ht 5\' 2"  (1.575 m)   Wt 212 lb (96.2 kg)   LMP 10/26/2015 (Within Weeks) Comment: intermittent  BMI 38.78 kg/m   Vitals and nursing notes reviewed  Physical Exam  Constitutional: She is well-developed, well-nourished, and in no distress.  MSK: Limited range of motion to lumbar spine secondary to pain. No tenderness to palpation or percussion along the lumbar midline. Tenderness to palpation of right sciatic nerve. Neurological exam: Positive straight leg raise on the right. 4/5 strength on the right compared to 5/5 on the left. Decreased sensation to light touch along the lateral lower leg. No atrophy. Good pulses. Walking with an antalgic gait  Tripod sitting in chair  Assessment/Plan: Please see problem based Assessment and Plan   Meds ordered this encounter  Medications  . cyclobenzaprine (FLEXERIL) 10 MG tablet    Sig: TAKE 1 TABLET (10 MG  TOTAL) BY MOUTH 3 (THREE) TIMES DAILY AS NEEDED FOR MUSCLE SPASMS.    Dispense:  60 tablet    Refill:  0    generic formulation acceptable.  Marland Kitchen. oxyCODONE-acetaminophen (PERCOCET/ROXICET) 5-325 MG tablet    Sig: Take 1 tablet by mouth every 6 (six) hours as needed for severe pain.    Dispense:  60 tablet    Refill:  0    Caryl AdaJazma Dartha Rozzell, DO 02/05/2016, 1:50 PM PGY-3, [address] [facility]

## 2016-02-05 NOTE — Assessment & Plan Note (Signed)
Most likely secondary to pain. No issues with tachycardia in past. Will continue to monitor. If persists full work-up indicated.

## 2016-02-05 NOTE — Patient Instructions (Signed)
Medications refilled Try and get into pain clinic in the next month Continue regimen we discussed Keep all follow-up appointments

## 2016-02-05 NOTE — Assessment & Plan Note (Signed)
Chronic back pain with sciatica. Not responding to steroid injections. Pain medications help reduce pain level but not resolving it. Rx for percocet given. Patient to try and get in pain clinic within the month. If additional refills needed will need to do UDS and pain contract. Continue Lyrica, mobic, and flexeril.

## 2016-02-08 ENCOUNTER — Ambulatory Visit: Payer: Medicare Other | Admitting: Sports Medicine

## 2016-02-08 ENCOUNTER — Ambulatory Visit (INDEPENDENT_AMBULATORY_CARE_PROVIDER_SITE_OTHER): Payer: Medicare Other | Admitting: Sports Medicine

## 2016-02-08 ENCOUNTER — Encounter: Payer: Self-pay | Admitting: Sports Medicine

## 2016-02-08 VITALS — BP 152/86

## 2016-02-08 DIAGNOSIS — M5441 Lumbago with sciatica, right side: Secondary | ICD-10-CM

## 2016-02-08 DIAGNOSIS — G8929 Other chronic pain: Secondary | ICD-10-CM

## 2016-02-08 NOTE — Progress Notes (Signed)
Patient ID: [name], female   DOB: 04/06/1966, 50 y.o.   MRN: [id number]  Patient comes in today for follow-up on lumbar degenerative disc disease and right-sided sciatica. She underwent a right-sided L5-S1 epidural steroid injection in June. This was followed by a right-sided L5 nerve root block in August. A third injection at the right L4-L5 level was done in September. Patient states that her injections helped her symptoms but only for 2-3 days. Her pain is quite debilitating. Therefore, I recommend a referral to [facility] to discuss further treatment options. Patient is in agreement with that plan. Follow-up with me as needed  Total time spent with the patient was 10 minutes with greater than 50% of the time spent in face-to-face consultation discussing her diagnosis and referral to Dr.Dumonski

## 2016-02-08 NOTE — Patient Instructions (Addendum)
[facility] Dr Yevette Edwardsumonski Monday 02/15/16 at 2pm [address], [place], [place] [address] Phone: [phone]

## 2016-02-15 DIAGNOSIS — F17210 Nicotine dependence, cigarettes, uncomplicated: Secondary | ICD-10-CM | POA: Diagnosis not present

## 2016-02-15 DIAGNOSIS — Z716 Tobacco abuse counseling: Secondary | ICD-10-CM | POA: Diagnosis not present

## 2016-02-15 DIAGNOSIS — M5416 Radiculopathy, lumbar region: Secondary | ICD-10-CM | POA: Diagnosis not present

## 2016-02-24 ENCOUNTER — Other Ambulatory Visit: Payer: Self-pay | Admitting: Orthopedic Surgery

## 2016-02-25 ENCOUNTER — Other Ambulatory Visit: Payer: Self-pay | Admitting: Family Medicine

## 2016-03-02 ENCOUNTER — Encounter: Payer: Self-pay | Admitting: Student

## 2016-03-02 ENCOUNTER — Ambulatory Visit (INDEPENDENT_AMBULATORY_CARE_PROVIDER_SITE_OTHER): Payer: Medicare Other | Admitting: Student

## 2016-03-02 VITALS — BP 160/68 | HR 91 | Temp 98.0°F | Wt 216.0 lb

## 2016-03-02 DIAGNOSIS — E118 Type 2 diabetes mellitus with unspecified complications: Secondary | ICD-10-CM

## 2016-03-02 DIAGNOSIS — G894 Chronic pain syndrome: Secondary | ICD-10-CM | POA: Diagnosis not present

## 2016-03-02 DIAGNOSIS — Z0000 Encounter for general adult medical examination without abnormal findings: Secondary | ICD-10-CM | POA: Diagnosis not present

## 2016-03-02 DIAGNOSIS — Z1159 Encounter for screening for other viral diseases: Secondary | ICD-10-CM

## 2016-03-02 LAB — POCT GLYCOSYLATED HEMOGLOBIN (HGB A1C): Hemoglobin A1C: 8

## 2016-03-02 MED ORDER — OXYCODONE-ACETAMINOPHEN 5-325 MG PO TABS: 1.0000 | ORAL_TABLET | Freq: Four times a day (QID) | ORAL | 0 refills | Status: DC | PRN

## 2016-03-02 NOTE — Progress Notes (Signed)
   Subjective:    Patient ID: [name], female    DOB: 06/26/1965, 50 y.o.   MRN: [id number]   CC: chronic pain med refill  HPI: [id number] Weeks y/o female with chronic back pain presents for refill of narcotic pain medication  Chronic back pain - to have surgery on 11/16 - previoulsy managed with percocet from our clinic, last prescribed on 10/13 #60 - also prescribed norco #40 on 10/23 by Ortho - states the norco makes her sick so she has not taken manyof them, did not bring the remainder to clinic because she forgot - has 1-2 percocets left - she denies drug use or other narcotic medications besides these - The [place] drug data base was reviewed  Smoking status reviewed Smokes up to 1ppd  Review of Systems  Per HPI, else denies recent illness, fever, chest pain, shortness of breath    Objective:  BP (!) 160/68   Pulse 91   Temp 98 F (36.7 C) (Oral)   Wt 216 lb (98 kg)   SpO2 100%   BMI 39.51 kg/m  Vitals and nursing note reviewed  General: NAD Cardiac: RRR Respiratory: CTAB, normal effort Extremities: left hip tenderness to palpation, negative right and leg straight raise Skin: warm and dry Neuro: alert and oriented   Assessment & Plan:    Diabetes mellitus type 2, controlled (HCC) A1c today 8- patient to follow with PCP for management Lipid panel was ordered today, but patient did not go to the lab  Chronic pain syndrome Indication for chronic opioid: back and hip pain Medication and dose: Percocet  Last UDS date: not obtained on visit on 11/8 Pain contract signed (Y/N): yes Date narcotic database last reviewed (include red flags): 11/8, with confirmed red flag of receipt of norco from ortho office in addition [facility]. Additionally the patient is also receiving valium.   At this visit, the patient asked for more percocet as she was unable to tolerate Norco. She did not bring the rest of her norco with her. Given her request for more pain  medications, she was asked to sign a pain contract and submit a urine sample for UDS. She did sign the pain contract and was counseled on the risk of falls as well as death with the chronic use of narcotics especially with the concurrent use of benzodiazepines which she is on. She expressed her understanding. However, after she was advised to leave a urine sample and was walked to the bathroom by the nurse, she proceeded to not leave a sample and left. She was given a prescription for percocet #10 prior to this. After learning that she did not leave a rine sample, the pharmacy on file was called and asked to not fill and keep her Rx should she present to them.  Given HIGH concern for narcotic misuse, she SHOULD NOT receive narcotics from the [facility] again  Healthcare maintenance Hep C screening lab ordered today, but patient did not go to the office    Sudiksha Victor A. Kennon RoundsHaney MD, MS Family Medicine Resident PGY-3 Pager [phone]

## 2016-03-02 NOTE — Assessment & Plan Note (Addendum)
A1c today 8- patient to follow with PCP for management Lipid panel was ordered today, but patient did not go to the lab

## 2016-03-02 NOTE — Patient Instructions (Signed)
Follow up with PCP to discuss pain needs BRING YOUR NORCO TO THE OFFICE TO BE DISPOSED OF Do not take narcotic pain meds and drive or operate machinery DO NOT get pain medication from other offices If you have questions or concerns, call the office at [phone]

## 2016-03-03 DIAGNOSIS — Z0000 Encounter for general adult medical examination without abnormal findings: Secondary | ICD-10-CM | POA: Insufficient documentation

## 2016-03-03 NOTE — Assessment & Plan Note (Signed)
Indication for chronic opioid: back and hip pain Medication and dose: Percocet  Last UDS date: not obtained on visit on 11/8 Pain contract signed (Y/N): yes Date narcotic database last reviewed (include red flags): 11/8, with confirmed red flag of receipt of norco from ortho office in addition to [facility]. Additionally the patient is also receiving valium.   At this visit, the patient asked for more percocet as she was unable to tolerate Norco. She did not bring the rest of her norco with her. Given her request for more pain medications, she was asked to sign a pain contract and submit a urine sample for UDS. She did sign the pain contract and was counseled on the risk of falls as well as death with the chronic use of narcotics especially with the concurrent use of benzodiazepines which she is on. She expressed her understanding. However, after she was advised to leave a urine sample and was walked to the bathroom by the nurse, she proceeded to not leave a sample and left. She was given a prescription for percocet #10 prior to this. After learning that she did not leave a rine sample, the pharmacy on file was called and asked to not fill and keep her Rx should she present to them.  Given HIGH concern for narcotic misuse, she SHOULD NOT receive narcotics from the [facility] again

## 2016-03-03 NOTE — Assessment & Plan Note (Addendum)
Hep C screening lab ordered today, but patient did not go to the office

## 2016-03-09 ENCOUNTER — Other Ambulatory Visit: Payer: Self-pay | Admitting: Family Medicine

## 2016-03-09 ENCOUNTER — Encounter (HOSPITAL_COMMUNITY)
Admission: RE | Admit: 2016-03-09 | Discharge: 2016-03-09 | Disposition: A | Discharge status: Home / Self Care | Payer: Medicare Other | Source: Ambulatory Visit | Attending: Orthopedic Surgery | Admitting: Orthopedic Surgery

## 2016-03-09 ENCOUNTER — Encounter (HOSPITAL_COMMUNITY): Payer: Self-pay

## 2016-03-09 HISTORY — DX: Unspecified osteoarthritis, unspecified site: M19.90

## 2016-03-09 HISTORY — DX: Pain in unspecified joint: M25.50

## 2016-03-09 HISTORY — DX: Dorsalgia, unspecified: M54.9

## 2016-03-09 HISTORY — DX: Insomnia, unspecified: G47.00

## 2016-03-09 HISTORY — DX: Other chronic pain: G89.29

## 2016-03-09 LAB — COMPREHENSIVE METABOLIC PANEL
ALT: 14 U/L (ref 14–54)
ANION GAP: 10 (ref 5–15)
AST: 19 U/L (ref 15–41)
Albumin: 3.8 g/dL (ref 3.5–5.0)
Alkaline Phosphatase: 54 U/L (ref 38–126)
BUN: 13 mg/dL (ref 6–20)
CHLORIDE: 105 mmol/L (ref 101–111)
CO2: 24 mmol/L (ref 22–32)
Calcium: 9.4 mg/dL (ref 8.9–10.3)
Creatinine, Ser: 0.68 mg/dL (ref 0.44–1.00)
GFR calc Af Amer: >60 mL/min (ref >60)
Glucose, Bld: 116 mg/dL — ABNORMAL HIGH (ref 65–99)
POTASSIUM: 4.3 mmol/L (ref 3.5–5.1)
Sodium: 139 mmol/L (ref 135–145)
Total Bilirubin: 0.4 mg/dL (ref 0.3–1.2)
Total Protein: 7.3 g/dL (ref 6.5–8.1)

## 2016-03-09 LAB — URINALYSIS, ROUTINE W REFLEX MICROSCOPIC
Bilirubin Urine: NEGATIVE
Glucose, UA: NEGATIVE mg/dL
Hgb urine dipstick: NEGATIVE
KETONES UR: NEGATIVE mg/dL
NITRITE: NEGATIVE
PH: 5.5 (ref 5.0–8.0)
PROTEIN: NEGATIVE mg/dL
Specific Gravity, Urine: 1.025 (ref 1.005–1.030)

## 2016-03-09 LAB — SURGICAL PCR SCREEN
MRSA, PCR: NEGATIVE
Staphylococcus aureus: NEGATIVE

## 2016-03-09 LAB — PROTIME-INR
INR: 0.97
PROTHROMBIN TIME: 12.9 s (ref 11.4–15.2)

## 2016-03-09 LAB — TYPE AND SCREEN
ABO/RH(D): A POS
ANTIBODY SCREEN: NEGATIVE

## 2016-03-09 LAB — CBC WITH DIFFERENTIAL/PLATELET
BASOS ABS: 0 10*3/uL (ref 0.0–0.1)
BASOS PCT: 0 %
EOS PCT: 2 %
Eosinophils Absolute: 0.2 10*3/uL (ref 0.0–0.7)
HCT: 43.9 % (ref 36.0–46.0)
Hemoglobin: 14.7 g/dL (ref 12.0–15.0)
LYMPHS PCT: 28 %
Lymphs Abs: 2.2 10*3/uL (ref 0.7–4.0)
MCH: 28.3 pg (ref 26.0–34.0)
MCHC: 33.5 g/dL (ref 30.0–36.0)
MCV: 84.6 fL (ref 78.0–100.0)
MONO ABS: 0.4 10*3/uL (ref 0.1–1.0)
Monocytes Relative: 5 %
NEUTROS ABS: 5 10*3/uL (ref 1.7–7.7)
Neutrophils Relative %: 65 %
PLATELETS: 206 10*3/uL (ref 150–400)
RBC: 5.19 MIL/uL — AB (ref 3.87–5.11)
RDW: 13.4 % (ref 11.5–15.5)
WBC: 7.7 10*3/uL (ref 4.0–10.5)

## 2016-03-09 LAB — URINE MICROSCOPIC-ADD ON: RBC / HPF: NONE SEEN RBC/hpf (ref 0–5)

## 2016-03-09 LAB — HCG, SERUM, QUALITATIVE: PREG SERUM: NEGATIVE

## 2016-03-09 LAB — GLUCOSE, CAPILLARY: Glucose-Capillary: 142 mg/dL — ABNORMAL HIGH (ref 65–99)

## 2016-03-09 LAB — APTT: APTT: 31 s (ref 24–36)

## 2016-03-09 LAB — ABO/RH: ABO/RH(D): A POS

## 2016-03-09 MED ORDER — POVIDONE-IODINE 7.5 % EX SOLN
Freq: Once | CUTANEOUS | Status: DC
  Filled 2016-03-09: qty 118

## 2016-03-09 NOTE — Anesthesia Preprocedure Evaluation (Addendum)
Anesthesia Evaluation  Patient identified by MRN, date of birth, ID band Patient awake    Reviewed: Allergy & Precautions, H&P , NPO status , Patient's Chart, lab work & pertinent test results  Airway Mallampati: II  TM Distance: >3 FB Neck ROM: Full    Dental no notable dental hx. (+) Edentulous Upper, Edentulous Lower, Dental Advisory Given, Poor Dentition, Missing   Pulmonary Current Smoker,    Pulmonary exam normal breath sounds clear to auscultation       Cardiovascular  Rhythm:Regular Rate:Normal     Neuro/Psych PSYCHIATRIC DISORDERS negative neurological ROS  negative psych ROS   GI/Hepatic negative GI ROS, Neg liver ROS,   Endo/Other  diabetes, Type 2, Oral Hypoglycemic Agents  Renal/GU negative Renal ROS  negative genitourinary   Musculoskeletal  (+) Arthritis , Osteoarthritis,    Abdominal   Peds  Hematology negative hematology ROS (+)   Anesthesia Other Findings   Reproductive/Obstetrics negative OB ROS                          Anesthesia Physical Anesthesia Plan  ASA: II  Anesthesia Plan: General   Post-op Pain Management:    Induction: Intravenous  Airway Management Planned: Oral ETT  Additional Equipment:   Intra-op Plan:   Post-operative Plan: Extubation in OR  Informed Consent: I have reviewed the patients History and Physical, chart, labs and discussed the procedure including the risks, benefits and alternatives for the proposed anesthesia with the patient or authorized representative who has indicated his/her understanding and acceptance.   Dental advisory given  Plan Discussed with: CRNA, Anesthesiologist and Surgeon  Anesthesia Plan Comments:        Anesthesia Quick Evaluation

## 2016-03-09 NOTE — Pre-Procedure Instructions (Signed)
Kimberly QuakerDoris R Weeks  03/09/2016      [id number] [place], [place] - [address] [place] [address] Phone: [phone] Fax: [phone]    Your procedure is scheduled on Thurs, Nov 16 @ 7:30 AM  [facility] at 5:30 AM  Call this number if you have problems the morning of surgery:  [phone]   Remember:  Do not eat food or drink liquids after midnight.  Take these medicines the morning of surgery with A SIP OF WATER Pain Pill(if needed) and Plaquenil(Hydroxychloroquine)             Stop taking your Mobic. No Goody's,BC's,Aleve,Advil,Motrin,Ibuprofen,Fish Oil,or any Herbal Medications.    Do not wear jewelry, make-up or nail polish.  Do not wear lotions, powders, perfumes, or deoderant.  Do not shave 48 hours prior to surgery.    Do not bring valuables to the hospital.  [facility] is not responsible for any belongings or valuables.  Contacts, dentures or bridgework may not be worn into surgery.  Leave your suitcase in the car.  After surgery it may be brought to your room.  For patients admitted to the hospital, discharge time will be determined by your treatment team.  Patients discharged the day of surgery will not be allowed to drive home.    [facility] Pre[facility]  Before surgery, you can play an important role.  Because skin is not sterile, your skin needs to be as free of germs as possible.  You can reduce the number of germs on you skin by washing with CHG (chlorahexidine gluconate) soap before surgery.  CHG is an antiseptic cleaner which kills germs and bonds with the skin to continue killing germs even after washing.  Please DO NOT use if you have an allergy to CHG or antibacterial soaps.  If your skin becomes reddened/irritated stop using the CHG and inform your nurse when you arrive at Short Stay.  Do not shave (including legs and underarms) for at least 48 hours prior  to the first CHG shower.  You may shave your face.  Please follow these instructions carefully:   1.  Shower with CHG Soap the night before surgery and the                                morning of Surgery.  2.  If you choose to wash your hair, wash your hair first as usual with your       normal shampoo.  3.  After you shampoo, rinse your hair and body thoroughly to remove the                      Shampoo.  4.  Use CHG as you would any other liquid soap.  You can apply chg directly       to the skin and wash gently with scrungie or a clean washcloth.  5.  Apply the CHG Soap to your body ONLY FROM THE NECK DOWN.        Do not use on open wounds or open sores.  Avoid contact with your eyes,       ears, mouth and genitals (private parts).  Wash genitals (private parts)       with your normal soap.  6.  Wash thoroughly, paying special attention to the area where your surgery  will be performed.  7.  Thoroughly rinse your body with warm water from the neck down.  8.  DO NOT shower/wash with your normal soap after using and rinsing off       the CHG Soap.  9.  Pat yourself dry with a clean towel.            10.  Wear clean pajamas.            11.  Place clean sheets on your bed the night of your first shower and do not        sleep with pets.  Day of Surgery  Do not apply any lotions/deoderants the morning of surgery.  Please wear clean clothes to the hospital/surgery center.    Please read over the following fact sheets that you were given. Pain Booklet, Coughing and Deep Breathing, MRSA Information and Surgical Site Infection Prevention

## 2016-03-09 NOTE — Progress Notes (Addendum)
Cardiologist denies  Medical Md is Dr.Jazma Doroteo Glassmanhelps  Echo report in epic from 2011/2017  Stress test denies  Heart cath denies  EKG in epic from 05-26-15  CXR in epic from 05/26/15

## 2016-03-10 ENCOUNTER — Inpatient Hospital Stay (HOSPITAL_COMMUNITY): Payer: Medicare Other

## 2016-03-10 ENCOUNTER — Encounter (HOSPITAL_COMMUNITY): Payer: Self-pay | Admitting: Certified Registered Nurse Anesthetist

## 2016-03-10 ENCOUNTER — Encounter (HOSPITAL_COMMUNITY)
Admission: RE | Disposition: A | Discharge status: Home / Self Care | Payer: Self-pay | Source: Ambulatory Visit | Attending: Orthopedic Surgery

## 2016-03-10 ENCOUNTER — Inpatient Hospital Stay (HOSPITAL_COMMUNITY): Payer: Medicare Other | Admitting: Anesthesiology

## 2016-03-10 ENCOUNTER — Inpatient Hospital Stay (HOSPITAL_COMMUNITY)
Admission: RE | Admit: 2016-03-10 | Discharge: 2016-03-12 | DRG: 455 | Disposition: A | Discharge status: Home / Self Care | Payer: Medicare Other | Source: Ambulatory Visit | Attending: Orthopedic Surgery | Admitting: Orthopedic Surgery

## 2016-03-10 DIAGNOSIS — F17210 Nicotine dependence, cigarettes, uncomplicated: Secondary | ICD-10-CM | POA: Diagnosis present

## 2016-03-10 DIAGNOSIS — M48061 Spinal stenosis, lumbar region without neurogenic claudication: Secondary | ICD-10-CM | POA: Diagnosis present

## 2016-03-10 DIAGNOSIS — M4807 Spinal stenosis, lumbosacral region: Secondary | ICD-10-CM | POA: Diagnosis present

## 2016-03-10 DIAGNOSIS — M4326 Fusion of spine, lumbar region: Secondary | ICD-10-CM | POA: Diagnosis not present

## 2016-03-10 DIAGNOSIS — M79604 Pain in right leg: Secondary | ICD-10-CM | POA: Diagnosis not present

## 2016-03-10 DIAGNOSIS — I10 Essential (primary) hypertension: Secondary | ICD-10-CM | POA: Diagnosis not present

## 2016-03-10 DIAGNOSIS — M5416 Radiculopathy, lumbar region: Secondary | ICD-10-CM | POA: Diagnosis not present

## 2016-03-10 DIAGNOSIS — M5410 Radiculopathy, site unspecified: Secondary | ICD-10-CM | POA: Diagnosis present

## 2016-03-10 DIAGNOSIS — E119 Type 2 diabetes mellitus without complications: Secondary | ICD-10-CM | POA: Diagnosis present

## 2016-03-10 DIAGNOSIS — Z7984 Long term (current) use of oral hypoglycemic drugs: Secondary | ICD-10-CM

## 2016-03-10 DIAGNOSIS — M4316 Spondylolisthesis, lumbar region: Secondary | ICD-10-CM | POA: Diagnosis present

## 2016-03-10 DIAGNOSIS — M5441 Lumbago with sciatica, right side: Secondary | ICD-10-CM

## 2016-03-10 DIAGNOSIS — M4317 Spondylolisthesis, lumbosacral region: Secondary | ICD-10-CM | POA: Diagnosis not present

## 2016-03-10 DIAGNOSIS — Z8249 Family history of ischemic heart disease and other diseases of the circulatory system: Secondary | ICD-10-CM

## 2016-03-10 DIAGNOSIS — R262 Difficulty in walking, not elsewhere classified: Secondary | ICD-10-CM

## 2016-03-10 DIAGNOSIS — Z419 Encounter for procedure for purposes other than remedying health state, unspecified: Secondary | ICD-10-CM

## 2016-03-10 HISTORY — PX: SPINE SURGERY: SHX786

## 2016-03-10 LAB — GLUCOSE, CAPILLARY
GLUCOSE-CAPILLARY: 108 mg/dL — AB (ref 65–99)
GLUCOSE-CAPILLARY: 191 mg/dL — AB (ref 65–99)

## 2016-03-10 LAB — HEMOGLOBIN A1C
HEMOGLOBIN A1C: 8 % — AB (ref 4.8–5.6)
Mean Plasma Glucose: 183 mg/dL

## 2016-03-10 SURGERY — POSTERIOR LUMBAR FUSION 2 LEVEL
Anesthesia: General | Laterality: Right

## 2016-03-10 MED ORDER — ONDANSETRON HCL 4 MG/2ML IJ SOLN: 4.0000 mg | INTRAMUSCULAR | Status: DC | PRN

## 2016-03-10 MED ORDER — SODIUM CHLORIDE 0.9 % IV SOLN
INTRAVENOUS | Status: DC | PRN
  Administered 2016-03-10: 13:00:00 via INTRAVENOUS

## 2016-03-10 MED ORDER — PROPOFOL 10 MG/ML IV BOLUS
INTRAVENOUS | Status: AC
  Filled 2016-03-10: qty 20

## 2016-03-10 MED ORDER — BUPIVACAINE-EPINEPHRINE 0.5% -1:200000 IJ SOLN
INTRAMUSCULAR | Status: DC | PRN
  Administered 2016-03-10 (×3): 10 mL

## 2016-03-10 MED ORDER — DIAZEPAM 5 MG PO TABS
ORAL_TABLET | ORAL | Status: AC
  Filled 2016-03-10: qty 1

## 2016-03-10 MED ORDER — MORPHINE SULFATE (PF) 2 MG/ML IV SOLN
1.0000 mg | INTRAVENOUS | Status: DC | PRN
  Administered 2016-03-10 – 2016-03-11 (×2): 2 mg via INTRAVENOUS
  Administered 2016-03-11 – 2016-03-12 (×3): 4 mg via INTRAVENOUS
  Filled 2016-03-10: qty 1
  Filled 2016-03-10: qty 2
  Filled 2016-03-10: qty 1
  Filled 2016-03-10 (×2): qty 2

## 2016-03-10 MED ORDER — METHYLENE BLUE 0.5 % INJ SOLN
INTRAVENOUS | Status: AC
  Filled 2016-03-10: qty 10

## 2016-03-10 MED ORDER — SODIUM CHLORIDE 0.9% FLUSH
3.0000 mL | Freq: Two times a day (BID) | INTRAVENOUS | Status: DC
  Administered 2016-03-10 – 2016-03-12 (×2): 3 mL via INTRAVENOUS

## 2016-03-10 MED ORDER — FLEET ENEMA 7-19 GM/118ML RE ENEM: 1.0000 | ENEMA | Freq: Once | RECTAL | Status: DC | PRN

## 2016-03-10 MED ORDER — ACETAMINOPHEN 325 MG PO TABS: 650.0000 mg | ORAL_TABLET | ORAL | Status: DC | PRN

## 2016-03-10 MED ORDER — LACTATED RINGERS IV SOLN
INTRAVENOUS | Status: DC | PRN
  Administered 2016-03-10 (×4): via INTRAVENOUS

## 2016-03-10 MED ORDER — BISACODYL 5 MG PO TBEC: 5.0000 mg | DELAYED_RELEASE_TABLET | Freq: Every day | ORAL | Status: DC | PRN

## 2016-03-10 MED ORDER — HYDROMORPHONE HCL 1 MG/ML IJ SOLN
INTRAMUSCULAR | Status: AC
Start: 2016-03-10 — End: 2016-03-11
  Filled 2016-03-10: qty 0.5

## 2016-03-10 MED ORDER — CLINDAMYCIN PHOSPHATE 900 MG/50ML IV SOLN
900.0000 mg | INTRAVENOUS | Status: AC
  Administered 2016-03-10: 900 mg via INTRAVENOUS
  Filled 2016-03-10: qty 50

## 2016-03-10 MED ORDER — HYDROMORPHONE HCL 1 MG/ML IJ SOLN
INTRAMUSCULAR | Status: AC
  Filled 2016-03-10: qty 0.5

## 2016-03-10 MED ORDER — PROPOFOL 10 MG/ML IV BOLUS
INTRAVENOUS | Status: DC | PRN
  Administered 2016-03-10 (×2): 50 mg via INTRAVENOUS
  Administered 2016-03-10: 100 mg via INTRAVENOUS

## 2016-03-10 MED ORDER — SUCCINYLCHOLINE CHLORIDE 200 MG/10ML IV SOSY
PREFILLED_SYRINGE | INTRAVENOUS | Status: AC
  Filled 2016-03-10: qty 10

## 2016-03-10 MED ORDER — THROMBIN 20000 UNITS EX SOLR
CUTANEOUS | Status: AC
  Filled 2016-03-10: qty 20000

## 2016-03-10 MED ORDER — 0.9 % SODIUM CHLORIDE (POUR BTL) OPTIME
TOPICAL | Status: DC | PRN
  Administered 2016-03-10 (×2): 1000 mL

## 2016-03-10 MED ORDER — EPHEDRINE 5 MG/ML INJ
INTRAVENOUS | Status: AC
  Filled 2016-03-10: qty 10

## 2016-03-10 MED ORDER — OXYCODONE-ACETAMINOPHEN 5-325 MG PO TABS
1.0000 | ORAL_TABLET | ORAL | Status: DC | PRN
  Administered 2016-03-10 – 2016-03-12 (×12): 2 via ORAL
  Filled 2016-03-10 (×11): qty 2

## 2016-03-10 MED ORDER — ACETAMINOPHEN 650 MG RE SUPP: 650.0000 mg | RECTAL | Status: DC | PRN

## 2016-03-10 MED ORDER — PHENOL 1.4 % MT LIQD: 1.0000 | OROMUCOSAL | Status: DC | PRN

## 2016-03-10 MED ORDER — ZOLPIDEM TARTRATE 5 MG PO TABS: 5.0000 mg | ORAL_TABLET | Freq: Every evening | ORAL | Status: DC | PRN

## 2016-03-10 MED ORDER — SODIUM CHLORIDE 0.9 % IV SOLN: 250.0000 mL | INTRAVENOUS | Status: DC

## 2016-03-10 MED ORDER — SENNOSIDES-DOCUSATE SODIUM 8.6-50 MG PO TABS: 1.0000 | ORAL_TABLET | Freq: Every evening | ORAL | Status: DC | PRN

## 2016-03-10 MED ORDER — LIDOCAINE HCL (CARDIAC) 20 MG/ML IV SOLN
INTRAVENOUS | Status: DC | PRN
  Administered 2016-03-10: 60 mg via INTRAVENOUS

## 2016-03-10 MED ORDER — MIDAZOLAM HCL 5 MG/5ML IJ SOLN
INTRAMUSCULAR | Status: DC | PRN
  Administered 2016-03-10: 2 mg via INTRAVENOUS

## 2016-03-10 MED ORDER — METFORMIN HCL 500 MG PO TABS
1000.0000 mg | ORAL_TABLET | Freq: Two times a day (BID) | ORAL | Status: DC
  Administered 2016-03-10 – 2016-03-12 (×5): 1000 mg via ORAL
  Filled 2016-03-10 (×5): qty 2

## 2016-03-10 MED ORDER — ROCURONIUM BROMIDE 100 MG/10ML IV SOLN
INTRAVENOUS | Status: DC | PRN
  Administered 2016-03-10 (×2): 10 mg via INTRAVENOUS
  Administered 2016-03-10: 50 mg via INTRAVENOUS
  Administered 2016-03-10: 10 mg via INTRAVENOUS
  Administered 2016-03-10: 20 mg via INTRAVENOUS

## 2016-03-10 MED ORDER — ONDANSETRON HCL 4 MG/2ML IJ SOLN
INTRAMUSCULAR | Status: AC
  Filled 2016-03-10: qty 2

## 2016-03-10 MED ORDER — HEMOSTATIC AGENTS (NO CHARGE) OPTIME
TOPICAL | Status: DC | PRN
  Administered 2016-03-10: 1 via TOPICAL

## 2016-03-10 MED ORDER — SODIUM CHLORIDE 0.9% FLUSH: 3.0000 mL | INTRAVENOUS | Status: DC | PRN

## 2016-03-10 MED ORDER — PREGABALIN 75 MG PO CAPS
150.0000 mg | ORAL_CAPSULE | Freq: Every day | ORAL | Status: DC
  Administered 2016-03-10 – 2016-03-11 (×2): 150 mg via ORAL
  Filled 2016-03-10 (×2): qty 2

## 2016-03-10 MED ORDER — HYDROXYCHLOROQUINE SULFATE 200 MG PO TABS
200.0000 mg | ORAL_TABLET | Freq: Two times a day (BID) | ORAL | Status: DC
  Administered 2016-03-10 – 2016-03-12 (×4): 200 mg via ORAL
  Filled 2016-03-10 (×4): qty 1

## 2016-03-10 MED ORDER — FENTANYL CITRATE (PF) 100 MCG/2ML IJ SOLN
INTRAMUSCULAR | Status: DC | PRN
  Administered 2016-03-10: 25 ug via INTRAVENOUS
  Administered 2016-03-10 (×2): 50 ug via INTRAVENOUS
  Administered 2016-03-10: 25 ug via INTRAVENOUS
  Administered 2016-03-10: 50 ug via INTRAVENOUS
  Administered 2016-03-10: 25 ug via INTRAVENOUS
  Administered 2016-03-10: 50 ug via INTRAVENOUS
  Administered 2016-03-10: 25 ug via INTRAVENOUS

## 2016-03-10 MED ORDER — DEXTROSE 5 % IV SOLN
INTRAVENOUS | Status: DC | PRN
  Administered 2016-03-10: 100 ug/min via INTRAVENOUS
  Administered 2016-03-10: 13:00:00 via INTRAVENOUS

## 2016-03-10 MED ORDER — ALUM & MAG HYDROXIDE-SIMETH 200-200-20 MG/5ML PO SUSP: 30.0000 mL | Freq: Four times a day (QID) | ORAL | Status: DC | PRN

## 2016-03-10 MED ORDER — FENTANYL CITRATE (PF) 100 MCG/2ML IJ SOLN
INTRAMUSCULAR | Status: AC
  Filled 2016-03-10: qty 4

## 2016-03-10 MED ORDER — OXYCODONE-ACETAMINOPHEN 5-325 MG PO TABS
ORAL_TABLET | ORAL | Status: AC
Start: 2016-03-10 — End: 2016-03-11
  Filled 2016-03-10: qty 2

## 2016-03-10 MED ORDER — DIAZEPAM 5 MG PO TABS
5.0000 mg | ORAL_TABLET | Freq: Four times a day (QID) | ORAL | Status: DC | PRN
  Administered 2016-03-10 – 2016-03-12 (×6): 5 mg via ORAL
  Filled 2016-03-10 (×5): qty 1

## 2016-03-10 MED ORDER — METHYLENE BLUE 0.5 % INJ SOLN
INTRAVENOUS | Status: DC | PRN
  Administered 2016-03-10: 98 mg via INTRAVENOUS

## 2016-03-10 MED ORDER — FENTANYL CITRATE (PF) 100 MCG/2ML IJ SOLN
INTRAMUSCULAR | Status: AC
  Filled 2016-03-10: qty 2

## 2016-03-10 MED ORDER — THROMBIN 20000 UNITS EX KIT
PACK | CUTANEOUS | Status: DC | PRN
  Administered 2016-03-10: 20000 [IU] via TOPICAL

## 2016-03-10 MED ORDER — HYDROMORPHONE HCL 1 MG/ML IJ SOLN
0.2500 mg | INTRAMUSCULAR | Status: DC | PRN
  Administered 2016-03-10 (×4): 0.5 mg via INTRAVENOUS

## 2016-03-10 MED ORDER — BUPIVACAINE LIPOSOME 1.3 % IJ SUSP
20.0000 mL | INTRAMUSCULAR | Status: AC
  Administered 2016-03-10: 20 mL
  Filled 2016-03-10: qty 20

## 2016-03-10 MED ORDER — MENTHOL 3 MG MT LOZG: 1.0000 | LOZENGE | OROMUCOSAL | Status: DC | PRN

## 2016-03-10 MED ORDER — VANCOMYCIN HCL 10 G IV SOLR
1250.0000 mg | Freq: Two times a day (BID) | INTRAVENOUS | Status: DC
  Administered 2016-03-10 – 2016-03-12 (×4): 1250 mg via INTRAVENOUS
  Filled 2016-03-10 (×6): qty 1250

## 2016-03-10 MED ORDER — PHENYLEPHRINE 40 MCG/ML (10ML) SYRINGE FOR IV PUSH (FOR BLOOD PRESSURE SUPPORT)
PREFILLED_SYRINGE | INTRAVENOUS | Status: DC | PRN
Start: 2016-03-10 — End: 2016-03-10
  Administered 2016-03-10: 160 ug via INTRAVENOUS
  Administered 2016-03-10 (×2): 80 ug via INTRAVENOUS

## 2016-03-10 MED ORDER — DOCUSATE SODIUM 100 MG PO CAPS
100.0000 mg | ORAL_CAPSULE | Freq: Two times a day (BID) | ORAL | Status: DC
  Administered 2016-03-10 – 2016-03-12 (×4): 100 mg via ORAL
  Filled 2016-03-10 (×4): qty 1

## 2016-03-10 MED ORDER — SODIUM CHLORIDE 0.9 % IV SOLN
INTRAVENOUS | Status: DC
  Administered 2016-03-10: 17:00:00 via INTRAVENOUS

## 2016-03-10 MED ORDER — ALBUMIN HUMAN 5 % IV SOLN
INTRAVENOUS | Status: DC | PRN
  Administered 2016-03-10: 09:00:00 via INTRAVENOUS

## 2016-03-10 MED ORDER — MIDAZOLAM HCL 2 MG/2ML IJ SOLN
INTRAMUSCULAR | Status: AC
  Filled 2016-03-10: qty 2

## 2016-03-10 SURGICAL SUPPLY — 99 items
APL SKNCLS STERI-STRIP NONHPOA (GAUZE/BANDAGES/DRESSINGS) ×1
BENZOIN TINCTURE PRP APPL 2/3 (GAUZE/BANDAGES/DRESSINGS) ×2 IMPLANT
BIT DRILL 3.2 (BIT) ×3
BIT DRILL 65X3.2XQC STP NS (BIT) IMPLANT
BIT DRL 65X3.2XQC STP NS (BIT) ×1
BLADE SURG ROTATE 9660 (MISCELLANEOUS) IMPLANT
BUR PRESCISION 1.7 ELITE (BURR) ×2 IMPLANT
BUR ROUND PRECISION 4.0 (BURR) IMPLANT
BUR ROUND PRECISION 4.0MM (BURR)
BUR SABER RD CUTTING 3.0 (BURR) IMPLANT
BUR SABER RD CUTTING 3.0MM (BURR)
CAGE BULLET CONCORDE 9X10X27 (Cage) ×1 IMPLANT
CAGE BULLET CONCORDE 9X10X27MM (Cage) ×1 IMPLANT
CAGE CONCORDE BULLET 9X10X27 (Cage) ×2 IMPLANT
CARTRIDGE OIL MAESTRO DRILL (MISCELLANEOUS) ×2 IMPLANT
CLOSURE STERI-STRIP 1/2X4 (GAUZE/BANDAGES/DRESSINGS) ×1
CLOSURE WOUND 1/2 X4 (GAUZE/BANDAGES/DRESSINGS)
CLSR STERI-STRIP ANTIMIC 1/2X4 (GAUZE/BANDAGES/DRESSINGS) ×1 IMPLANT
CONT SPEC STER OR (MISCELLANEOUS) ×1 IMPLANT
COVER SURGICAL LIGHT HANDLE (MISCELLANEOUS) ×3 IMPLANT
DIFFUSER DRILL AIR PNEUMATIC (MISCELLANEOUS) ×4 IMPLANT
DRAIN CHANNEL 15F RND FF W/TCR (WOUND CARE) ×3 IMPLANT
DRAPE C-ARM 42X72 X-RAY (DRAPES) ×3 IMPLANT
DRAPE C-ARMOR (DRAPES) ×2 IMPLANT
DRAPE ORTHO SPLIT 77X108 STRL (DRAPES)
DRAPE POUCH INSTRU U-SHP 10X18 (DRAPES) ×3 IMPLANT
DRAPE SURG 17X23 STRL (DRAPES) ×12 IMPLANT
DRAPE SURG ORHT 6 SPLT 77X108 (DRAPES) ×1 IMPLANT
DRSG MEPILEX BORDER 4X12 (GAUZE/BANDAGES/DRESSINGS) IMPLANT
DRSG MEPILEX BORDER 4X8 (GAUZE/BANDAGES/DRESSINGS) IMPLANT
DURAPREP 26ML APPLICATOR (WOUND CARE) ×3 IMPLANT
ELECT BLADE 4.0 EZ CLEAN MEGAD (MISCELLANEOUS) ×3
ELECT CAUTERY BLADE 6.4 (BLADE) ×6 IMPLANT
ELECT REM PT RETURN 9FT ADLT (ELECTROSURGICAL) ×3
ELECTRODE BLDE 4.0 EZ CLN MEGD (MISCELLANEOUS) ×1 IMPLANT
ELECTRODE REM PT RTRN 9FT ADLT (ELECTROSURGICAL) ×1 IMPLANT
EVACUATOR SILICONE 100CC (DRAIN) ×3 IMPLANT
FEE INTRAOP MONITOR IMPULS NCS (MISCELLANEOUS) IMPLANT
GAUZE SPONGE 4X4 12PLY STRL (GAUZE/BANDAGES/DRESSINGS) ×3 IMPLANT
GAUZE SPONGE 4X4 16PLY XRAY LF (GAUZE/BANDAGES/DRESSINGS) ×6 IMPLANT
GLOVE BIO SURGEON STRL SZ7 (GLOVE) ×3 IMPLANT
GLOVE BIO SURGEON STRL SZ8 (GLOVE) ×3 IMPLANT
GLOVE BIOGEL PI IND STRL 7.0 (GLOVE) ×1 IMPLANT
GLOVE BIOGEL PI IND STRL 8 (GLOVE) ×1 IMPLANT
GLOVE BIOGEL PI INDICATOR 7.0 (GLOVE) ×2
GLOVE BIOGEL PI INDICATOR 8 (GLOVE) ×2
GOWN STRL REUS W/ TWL LRG LVL3 (GOWN DISPOSABLE) ×2 IMPLANT
GOWN STRL REUS W/ TWL XL LVL3 (GOWN DISPOSABLE) ×1 IMPLANT
GOWN STRL REUS W/TWL LRG LVL3 (GOWN DISPOSABLE) ×6
GOWN STRL REUS W/TWL XL LVL3 (GOWN DISPOSABLE) ×3
INTRAOP MONITOR FEE IMPULS NCS (MISCELLANEOUS) ×1
INTRAOP MONITOR FEE IMPULSE (MISCELLANEOUS) ×2
IV CATH 14GX2 1/4 (CATHETERS) ×3 IMPLANT
KIT BASIN OR (CUSTOM PROCEDURE TRAY) ×3 IMPLANT
KIT POSITION SURG JACKSON T1 (MISCELLANEOUS) ×3 IMPLANT
KIT ROOM TURNOVER OR (KITS) ×3 IMPLANT
MARKER SKIN DUAL TIP RULER LAB (MISCELLANEOUS) ×3 IMPLANT
MIX DBX 20CC MTF (Putty) ×2 IMPLANT
NDL HYPO 25GX1X1/2 BEV (NEEDLE) ×1 IMPLANT
NDL SPNL 18GX3.5 QUINCKE PK (NEEDLE) ×2 IMPLANT
NEEDLE HYPO 25GX1X1/2 BEV (NEEDLE) ×3 IMPLANT
NEEDLE SPNL 18GX3.5 QUINCKE PK (NEEDLE) ×6 IMPLANT
NS IRRIG 1000ML POUR BTL (IV SOLUTION) ×5 IMPLANT
OIL CARTRIDGE MAESTRO DRILL (MISCELLANEOUS) ×3
PACK LAMINECTOMY ORTHO (CUSTOM PROCEDURE TRAY) ×3 IMPLANT
PACK UNIVERSAL I (CUSTOM PROCEDURE TRAY) ×3 IMPLANT
PAD ARMBOARD 7.5X6 YLW CONV (MISCELLANEOUS) ×6 IMPLANT
PATTIES SURGICAL .5 X1 (DISPOSABLE) ×3 IMPLANT
PATTIES SURGICAL .5 X3 (DISPOSABLE) IMPLANT
PATTIES SURGICAL .5X1.5 (GAUZE/BANDAGES/DRESSINGS) ×3 IMPLANT
PATTIES SURGICAL .75X.75 (GAUZE/BANDAGES/DRESSINGS) ×3 IMPLANT
ROD EXPEDIUM PRE BENT 55MM (Rod) ×4 IMPLANT
SCREW CORTICAL VIPER 7X35 (Screw) ×2 IMPLANT
SCREW CORTICAL VIPER 7X40MM (Screw) ×2 IMPLANT
SCREW SET SINGLE INNER (Screw) ×12 IMPLANT
SCREW VIPER CORTICAL FIX 6X40 (Screw) ×8 IMPLANT
SPONGE INTESTINAL PEANUT (DISPOSABLE) ×2 IMPLANT
SPONGE LAP 4X18 X RAY DECT (DISPOSABLE) ×2 IMPLANT
SPONGE SURGIFOAM ABS GEL 100 (HEMOSTASIS) ×2 IMPLANT
STRIP CLOSURE SKIN 1/2X4 (GAUZE/BANDAGES/DRESSINGS) IMPLANT
SURGIFLO W/THROMBIN 8M KIT (HEMOSTASIS) IMPLANT
SUT ETHILON 2 0 FS 18 (SUTURE) ×2 IMPLANT
SUT MNCRL AB 4-0 PS2 18 (SUTURE) ×3 IMPLANT
SUT PROLENE 6 0 C 1 24 (SUTURE) IMPLANT
SUT VIC AB 0 CT1 18XCR BRD 8 (SUTURE) ×2 IMPLANT
SUT VIC AB 0 CT1 8-18 (SUTURE) ×9
SUT VIC AB 1 CT1 18XCR BRD 8 (SUTURE) ×2 IMPLANT
SUT VIC AB 1 CT1 8-18 (SUTURE) ×6
SUT VIC AB 2-0 CT2 18 VCP726D (SUTURE) ×8 IMPLANT
SYR 20CC LL (SYRINGE) ×3 IMPLANT
SYR BULB IRRIGATION 50ML (SYRINGE) ×3 IMPLANT
SYR CONTROL 10ML LL (SYRINGE) ×5 IMPLANT
SYR TB 1ML LUER SLIP (SYRINGE) ×3 IMPLANT
TAPE CLOTH SURG 4X10 WHT LF (GAUZE/BANDAGES/DRESSINGS) ×2 IMPLANT
TOWEL OR 17X24 6PK STRL BLUE (TOWEL DISPOSABLE) ×3 IMPLANT
TOWEL OR 17X26 10 PK STRL BLUE (TOWEL DISPOSABLE) ×3 IMPLANT
TRAY FOLEY CATH 16FRSI W/METER (SET/KITS/TRAYS/PACK) ×3 IMPLANT
WATER STERILE IRR 1000ML POUR (IV SOLUTION) ×1 IMPLANT
YANKAUER SUCT BULB TIP NO VENT (SUCTIONS) ×3 IMPLANT

## 2016-03-10 NOTE — Transfer of Care (Signed)
Immediate Anesthesia Transfer of Care Note  Patient: Lenetta Quakeroris R Fuquay  Procedure(s) Performed: Procedure(s) with comments: RIGHT SIDED LUMBAR 4-5, LUMBAR 5-SACRUM 1 TRANSFORAMINAL LUMBAR INTERBODY FUSION WITH INSTRUMENTATION AND ALLOGRAFT (Right) - RIGHT SIDED LUMBAR 4-5, LUMBAR 5-SACRUM 1 TRANSFORAMINAL LUMBAR INTERBODY FUSION WITH INSTRUMENTATION AND ALLOGRAFT  Patient Location: PACU  Anesthesia Type:General  Level of Consciousness: awake, alert , oriented and patient cooperative  Airway & Oxygen Therapy: Patient Spontanous Breathing and Patient connected to nasal cannula oxygen  Post-op Assessment: Report given to RN, Post -op Vital signs reviewed and stable and Patient moving all extremities X 4  Post vital signs: Reviewed and stable  Last Vitals:  Vitals:   03/10/16 0633  BP: (!) 154/76  Pulse: 89  Resp: 20  Temp: 37.1 C    Last Pain:  Vitals:   03/10/16 0650  TempSrc:   PainSc: 7          Complications: No apparent anesthesia complications

## 2016-03-10 NOTE — Progress Notes (Signed)
Pharmacy Antibiotic Note  [name] is a 50 y.o. female admitted on 03/10/2016, s/p lumbar fusion, on vancomycin for surgical prophylaxis d/t penicillin allergy (hives). Scr 0.68 on 11/15, est. crcl ~ 80 - 85 ml/min. She received clindamycin before surgery at 0817. Pt has a closed system drain.    Plan: Vancomycin 1250 mg IV every 12 hours.  Goal trough 15-20 mcg/mL.  F/u drain status and LOT  Height: 5\' 3"  (160 cm) Weight: 216 lb (98 kg) IBW/kg (Calculated) : 52.4  Temp (24hrs), Avg:97.9 F (36.6 C), Min:97.2 F (36.2 C), Max:98.8 F (37.1 C)   Recent Labs Lab 03/09/16 1425  WBC 7.7  CREATININE 0.68    Estimated Creatinine Clearance: 91.7 mL/min (by C-G formula based on SCr of 0.68 mg/dL).    Allergies  Allergen Reactions  . Penicillins Hives and Rash    Rash and hives as child, per pt's mother  Has patient had a PCN reaction causing immediate rash, facial/tongue/throat swelling, SOB or lightheadedness with hypotension: No Has patient had a PCN reaction causing severe rash involving mucus membranes or skin necrosis: No Has patient had a PCN reaction that required hospitalization No Has patient had a PCN reaction occurring within the last 10 years: No If all of the above answers are "NO", then may proceed with Cep    Antimicrobials this admission: Vancomycin 11/16 >>  Dose adjustments this admission:   Microbiology results:   Thank you for allowing pharmacy to be a part of this patient's care.  Bayard HuggerMei Daschel Roughton, PharmD, BCPS  Clinical Pharmacist  Pager: [phone]   03/10/2016 5:16 PM

## 2016-03-10 NOTE — Anesthesia Procedure Notes (Signed)
Procedure Name: Intubation Date/Time: 03/10/2016 7:59 AM Performed by: Rogelia BogaMUELLER, Kiele Heavrin P Pre-anesthesia Checklist: Patient identified, Emergency Drugs available, Suction available, Patient being monitored and Timeout performed Patient Re-evaluated:Patient Re-evaluated prior to inductionOxygen Delivery Method: Circle system utilized Preoxygenation: Pre-oxygenation with 100% oxygen Intubation Type: IV induction Ventilation: Mask ventilation without difficulty and Oral airway inserted - appropriate to patient size Laryngoscope Size: Mac and 4 Grade View: Grade I Tube type: Oral Tube size: 7.0 mm Number of attempts: 2 Airway Equipment and Method: Stylet Placement Confirmation: ETT inserted through vocal cords under direct vision,  positive ETCO2 and breath sounds checked- equal and bilateral Secured at: 22 cm Tube secured with: Tape Comments: Intubation attempt by Tiffany, EMT, unable to visualize cords. DL X1 by CRNA as above

## 2016-03-10 NOTE — Anesthesia Postprocedure Evaluation (Signed)
Anesthesia Post Note  Patient: [name]  Procedure(s) Performed: Procedure(s) (LRB): RIGHT SIDED LUMBAR 4-5, LUMBAR 5-SACRUM 1 TRANSFORAMINAL LUMBAR INTERBODY FUSION WITH INSTRUMENTATION AND ALLOGRAFT (Right)  Patient location during evaluation: PACU Anesthesia Type: General Level of consciousness: awake and alert Pain management: pain level controlled Vital Signs Assessment: post-procedure vital signs reviewed and stable Respiratory status: spontaneous breathing, nonlabored ventilation, respiratory function stable and patient connected to nasal cannula oxygen Cardiovascular status: blood pressure returned to baseline and stable Postop Assessment: no signs of nausea or vomiting Anesthetic complications: no    Last Vitals:  Vitals:   03/10/16 1530 03/10/16 1545  BP: 136/80 (!) 155/87  Pulse: 93 91  Resp: 15 (!) 22  Temp:      Last Pain:  Vitals:   03/10/16 1530  TempSrc:   PainSc: Asleep                 [name],[name]

## 2016-03-10 NOTE — H&P (Signed)
PREOPERATIVE H&P  Chief Complaint: Right leg pain  HPI: [name] is a 50 y.o. female who presents with ongoing pain in the right leg  MRI reveals stenosis L4-S1,a xrays reveal a grade 1 slip L4-S1  Patient has failed multiple forms of conservative care and continues to have pain (see office notes for additional details regarding the patient's full course of treatment)  Past Medical History:  Diagnosis Date  . Arthritis   . Chronic back pain    DDD  . Diabetes mellitus without complication (HCC)    takes Metformin daily  . Insomnia    takes Melatonin nightly  . Joint pain   . Lupus    takes Plaquenil daily  . Substance abuse    Past Surgical History:  Procedure Laterality Date  . APPENDECTOMY    . TONSILLECTOMY     Social History   Social History  . Marital status: Single    Spouse name: N/A  . Number of children: N/A  . Years of education: N/A   Social History Main Topics  . Smoking status: Current Every Day Smoker    Packs/day: 1.00    Years: 30.00    Types: Cigarettes  . Smokeless tobacco: Never Used     Comment: 2-3  cigs per day  . Alcohol use 0.0 oz/week     Comment: occasionally  . Drug use: No  . Sexual activity: Not Asked   Other Topics Concern  . None   Social History Narrative  . None   Family History  Problem Relation Age of Onset  . Hypertension Mother    Allergies  Allergen Reactions  . Penicillins Hives and Rash    Rash and hives as child, per pt's mother  Has patient had a PCN reaction causing immediate rash, facial/tongue/throat swelling, SOB or lightheadedness with hypotension: No Has patient had a PCN reaction causing severe rash involving mucus membranes or skin necrosis: No Has patient had a PCN reaction that required hospitalization No Has patient had a PCN reaction occurring within the last 10 years: No If all of the above answers are "NO", then may proceed with Cep   Prior to Admission medications   Medication  Sig Start Date End Date Taking? Authorizing Provider  cyclobenzaprine (FLEXERIL) 10 MG tablet TAKE 1 TABLET (10 MG TOTAL) BY MOUTH 3 (THREE) TIMES DAILY AS NEEDED FOR MUSCLE SPASMS. 02/05/16  Yes Pincus LargeJazma Y Phelps, DO  HYDROcodone-acetaminophen (NORCO/VICODIN) 5-325 MG tablet Take 1 tablet by mouth every 6 (six) hours as needed for moderate pain.   Yes Historical Provider, MD  hydroxychloroquine (PLAQUENIL) 200 MG tablet Take 200 mg by mouth 2 (two) times daily.  06/24/14  Yes Historical Provider, MD  Melatonin 5 MG TABS Take 10 mg by mouth at bedtime.   Yes Historical Provider, MD  metFORMIN (GLUCOPHAGE) 1000 MG tablet TAKE 1 TABLET BY MOUTH 2 TIMES DAILY WITH A MEAL 03/09/16  Yes Pincus LargeJazma Y Phelps, DO  oxyCODONE-acetaminophen (PERCOCET/ROXICET) 5-325 MG tablet Take 1 tablet by mouth every 6 (six) hours as needed for severe pain. 03/02/16  Yes Alyssa A Haney, MD  pregabalin (LYRICA) 150 MG capsule Take 1 capsule (150 mg total) by mouth at bedtime. 09/16/15  Yes Jamal CollinJames R Joyner, MD  glucose blood test strip Check sugar once daily. ICD10 E11.65 06/04/15   Carney LivingMarshall L Chambliss, MD  meloxicam (MOBIC) 15 MG tablet Take 1 tablet (15 mg total) by mouth daily. Patient not taking: Reported on 03/03/2016 01/26/16  Pincus LargeJazma Y Phelps, DO     All other systems have been reviewed and were otherwise negative with the exception of those mentioned in the HPI and as above.  Physical Exam: Vitals:   03/10/16 0633  BP: (!) 154/76  Pulse: 89  Resp: 20  Temp: 98.8 F (37.1 C)    General: Alert, no acute distress Cardiovascular: No pedal edema Respiratory: No cyanosis, no use of accessory musculature Skin: No lesions in the area of chief complaint Neurologic: Sensation intact distally Psychiatric: Patient is competent for consent with normal mood and affect Lymphatic: No axillary or cervical lymphadenopathy  MUSCULOSKELETAL: + SLR on the right  Assessment/Plan: Radiculopathy Plan for Procedure(s): RIGHT SIDED LUMBAR  4-5, LUMBAR 5-SACRUM 1 TRANSFORAMINAL LUMBAR INTERBODY FUSION WITH INSTRUMENTATION AND ALLOGRAFT   Emilee HeroUMONSKI,Shahzaib Azevedo LEONARD, MD 03/10/2016 7:35 AM

## 2016-03-11 ENCOUNTER — Encounter (HOSPITAL_COMMUNITY): Payer: Self-pay

## 2016-03-11 LAB — GLUCOSE, CAPILLARY
GLUCOSE-CAPILLARY: 145 mg/dL — AB (ref 65–99)
Glucose-Capillary: 184 mg/dL — ABNORMAL HIGH (ref 65–99)

## 2016-03-11 MED FILL — Sodium Chloride IV Soln 0.9%: INTRAVENOUS | Qty: 1000 | Status: AC

## 2016-03-11 MED FILL — Thrombin For Soln 20000 Unit: CUTANEOUS | Qty: 1 | Status: AC

## 2016-03-11 MED FILL — Heparin Sodium (Porcine) Inj 1000 Unit/ML: INTRAMUSCULAR | Qty: 30 | Status: AC

## 2016-03-11 NOTE — Evaluation (Signed)
Occupational Therapy Evaluation and Discharge Patient Details Name: Kimberly QuakerDoris R Weeks MRN: [id number] DOB: 09/24/1965 Today's Date: 03/11/2016    History of Present Illness Pt is a 50 y/o female s/p L4-S1 decompression and fusion. Pt has a past medical history of Arthritis; Chronic back pain; Diabetes mellitus without complication; Insomnia; Joint pain; Lupus; and Substance abuse. Pt has a past surgical history that includes Appendectomy and Tonsillectomy.   Clinical Impression   PTA Pt modified independent with ADL and using a SPC for ambulation. Pt currently mod A for ADL and 1HHA with cane for ambulation. Reviewed precautions and common areas of breaking them in home environment on handout. See deficit list below for ADL. Pt states that she will have help most of the time (lives with mother) and is not worried about ADL or bathing transfers. She is most worried about her RLE and that it feels heavy. She does not want it to give out on her. OT provided all necessary education, and after conversation, declined HHOT - she remained focused on mobility and RLE. Thank you for this referral.    Follow Up Recommendations  Supervision - Intermittent;No OT follow up    Equipment Recommendations  None recommended by OT;Other (comment) (spoke with Pt re: tub bench - does not want to buy)    Recommendations for Other Services       Precautions / Restrictions Precautions Precautions: Back Precaution Booklet Issued: Yes (comment) Precaution Comments: BLT handout reviewed thoroughly Restrictions Weight Bearing Restrictions: No      Mobility Bed Mobility Overal bed mobility: Needs Assistance Bed Mobility: Rolling;Sidelying to Sit Rolling: Min assist (verbal cues for bending knee, and hand placement ) Sidelying to sit: Mod assist;HOB elevated       General bed mobility comments: Pt will have hospital bed at home  Transfers Overall transfer level: Needs assistance Equipment used: Straight  cane;1 person hand held assist Transfers: Sit to/from Stand Sit to Stand: Min assist         General transfer comment: Talked to Pt about using RW, and [facility]    Balance Overall balance assessment: Needs assistance Sitting-balance support: No upper extremity supported;Feet supported Sitting balance-Leahy Scale: Fair     Standing balance support: Bilateral upper extremity supported;During functional activity Standing balance-Leahy Scale: Poor                              ADL Overall ADL's : Needs assistance/impaired Eating/Feeding: Modified independent;Sitting   Grooming: Wash/dry hands;Min guard;Standing Grooming Details (indicate cue type and reason): sink level Upper Body Bathing: Minimal assitance   Lower Body Bathing: Moderate assistance;Sitting/lateral leans   Upper Body Dressing : Maximal assistance;Standing Upper Body Dressing Details (indicate cue type and reason): Pt needed assist for donning brace (had to apply in sitting due to large chest)     Toilet Transfer: Minimal assistance;BSC;Comfort height toilet (BSC over toilet in bathroom)   Toileting- Clothing Manipulation and Hygiene: Supervision/safety Toileting - Clothing Manipulation Details (indicate cue type and reason): Pt able to perform peri care after urination. Pt discussed options for cleaning after BM and maintaining precautions     Functional mobility during ADLs: Minimal assistance;Cane (Pt preferred to do 1HHA or role IV pole during this session) General ADL Comments: Pt edcuated on precautions with ADL "I'm really only worried about this right leg giving out, it feels heavy. I can figure out everything else and will have help most of the  time."     Vision     Perception     Praxis      Pertinent Vitals/Pain Pain Assessment: 0-10 Pain Score: 7  Pain Location: Lower back, and fear about her RLE Pain Descriptors / Indicators: Dull;Heaviness;Grimacing;Sore Pain  Intervention(s): Limited activity within patient's tolerance;Monitored during session;Repositioned     Hand Dominance Right   Extremity/Trunk Assessment Upper Extremity Assessment Upper Extremity Assessment: Overall WFL for tasks assessed;Generalized weakness   Lower Extremity Assessment Lower Extremity Assessment: Generalized weakness;Defer to PT evaluation   Cervical / Trunk Assessment Cervical / Trunk Assessment: Other exceptions Cervical / Trunk Exceptions: s/p Back surgery   Communication Communication Communication: No difficulties   Cognition Arousal/Alertness: Awake/alert Behavior During Therapy: WFL for tasks assessed/performed Overall Cognitive Status: Within Functional Limits for tasks assessed                     General Comments       Exercises       Shoulder Instructions      Home Living Family/patient expects to be discharged to:: Private residence Living Arrangements: Parent Available Help at Discharge: Family Type of Home: House Home Access: Stairs to enter Secretary/administratorntrance Stairs-Number of Steps: 1 Entrance Stairs-Rails: None Home Layout: One level     Bathroom Shower/Tub: Tub/shower unit Shower/tub characteristics: Engineer, building servicesCurtain Bathroom Toilet: Handicapped height Bathroom Accessibility: Yes How Accessible: Accessible via walker Home Equipment: Cane - single point;Bedside commode;Shower seat;Hospital bed;Walker - 4 wheels   Additional Comments: hospital bed should be coming today      Prior Functioning/Environment Level of Independence: Needs assistance  Gait / Transfers Assistance Needed: used SPC for ambulation ADL's / Homemaking Assistance Needed: mom provided assist with IADL            OT Problem List: Decreased strength;Decreased range of motion;Decreased activity tolerance;Impaired balance (sitting and/or standing);Decreased knowledge of use of DME or AE;Decreased knowledge of precautions;Obesity;Pain   OT Treatment/Interventions:       OT Goals(Current goals can be found in the care plan section) Acute Rehab OT Goals Patient Stated Goal: To get back to walking again OT Goal Formulation: With patient Time For Goal Achievement: 03/18/16 Potential to Achieve Goals: Good  OT Frequency: Min 2X/week   Barriers to D/C: Decreased caregiver support (Pt will be alone at times during the day)          Co-evaluation              End of Session Equipment Utilized During Treatment: Gait belt;Rolling walker;Back brace Nurse Communication: Mobility status  Activity Tolerance: Patient tolerated treatment well Patient left: in bed;with call bell/phone within reach   Time: 1204-1240 OT Time Calculation (min): 36 min Charges:  OT General Charges $OT Visit: 1 Procedure OT Evaluation $OT Eval Moderate Complexity: 1 Procedure OT Treatments $Self Care/Home Management : 8-22 mins G-Codes:    Evern BioLaura J Renessa Wellnitz 03/11/2016, 3:50 PM Sherryl MangesLaura Janani Chamber OTR/L [phone]

## 2016-03-11 NOTE — Op Note (Addendum)
NAMRoselind Rily:  Bogdan, [name]                 ACCOUNT NO.:  [id number]  MEDICAL RECORD NO.:  [id number]  LOCATION:  [id number]                        FACILITY:  [facility]  PHYSICIAN:  Estill BambergMark Jaivyn Gulla, MD      DATE OF BIRTH:  02/18/1966  DATE OF PROCEDURE:  03/10/2016                              OPERATIVE REPORT   PREOPERATIVE DIAGNOSES: 1. Right-sided lumbar radiculopathy. 2. Grade 1 spondylolisthesis; L4-5, L5-S1. 3. Spinal stenosis; L4-5, L5-S1.  POSTOPERATIVE DIAGNOSE: 1. Right-sided lumbar radiculopathy. 2. Grade 1 spondylolisthesis; L4-5, L5-S1. 3. Spinal stenosis; L4-5, L5-S1.  PROCEDURE: 1. Right-sided transforaminal lumbar interbody fusion; L4-5, L5-S1. 2. Left-sided posterolateral fusion; L4-5, L5-S1. 3. Placement of posterior segmental instrumentation; L4, L5, S1. 4. Insertion of interbody device x2 (Concorde Bullet cages). 5. Use of local autograft. 6. Use of morselized allograft. 7. Intraoperative use of fluoroscopy.  SURGEON:  Estill BambergMark Kashari Chalmers, MD  ASSISTANT:  Jason CoopKayla McKenzie, PA-C.  ANESTHESIA:  General endotracheal anesthesia.  COMPLICATIONS:  None.  DISPOSITION:  Stable.  ESTIMATED BLOOD LOSS:  300 mL.  INDICATIONS FOR SURGERY:  Briefly, [name] is a pleasant 50 year old female, who did present to me with ongoing and severe pain in her right leg.  Her radiographs were notable for a spondylolisthesis, dynamic; L4- 5 and L5-S1.  Varying degrees of stenosis was also noted.  She did get temporary benefit with an epidural injection, but her pain did continue. She did fail other forms of conservative care.  We therefore did discuss proceeding with the procedure reflected above.  The patient was fully aware of the risks and limitations of surgery as outlined in my preoperative note.  OPERATIVE DETAILS:  On March 10, 2016, the patient was brought to surgery and general endotracheal anesthesia was administered.  The patient was placed prone on a well-padded flat Jackson bed.   All bony prominences were padded.  The back was prepped and draped, and a time- out procedure was performed.  A midline incision was then made overlying the L4 to S1 regions.  The lamina from L4 to S1 was identified.  There was significant hypertrophy of the L4-5 and L5-S1 facet joints bilaterally.  This was removed using a rongeur in addition to a high- speed bur.  Using anatomic landmarks in addition to AP and lateral fluoroscopy, I did cannulate the L4, L5, and S1 pedicles using a cortical trajectory technique.  The facet joints were decorticated bilaterally, as was the posterolateral gutter on the left side.  I then placed the screws of the appropriate diameter and length into the L4, L5, and S1 pedicles on the left.  A rod was then applied for provisional fixation.  On the right side, bone wax was placed in the cannulated pedicles.  I then proceeded with a full facetectomy on the right side at L5-S1.  With an assistant holding medial retraction of the traversing right S1 nerve, a thorough and complete L5-S1 intervertebral diskectomy was performed.  There were significant disc fragments noted in the foramen, which were removed.  After a thorough diskectomy was performed, autograft in addition to allograft in the form of DBX mix was packed into the intervertebral space.  The appropriate size interbody spacer was  also packed with allograft and autograft and tamped into position in the usual fashion.  I was very pleased with the press-fit of the implant.  I then turned my attention to the L4-5 intervertebral space. Once again, a full L4-5 facetectomy was performed.  With an assistant holding medial retraction of the traversing right L5 nerve, a thorough and complete L4-5 intervertebral diskectomy was performed.  After preparing the endplates, the intervertebral space was packed with allograft and autograft, as was the appropriate size intervertebral spacer.  The intervertebral spacer was  then tamped into the intervertebral space in the usual fashion.  I was very pleased with the appearance of the AP and lateral fluoroscopic images.  The wound was then copiously irrigated.  On the right side, I then placed pedicle screws into the L4, L5, and S1 pedicles.  An additional 55 mm rod was placed on the right and caps were placed and provisionally tightened.  A final locking procedure was then performed bilaterally at the L4, L5, and S1 pedicles.  Once again, I was very pleased with the appearance of the fluoroscopic images.  On the left side, autograft from the decompression as well as additional allograft was packed into the posterolateral gutter and along the posterior elements to help aid in the success of the fusion.  Of note, I did use neurologic monitoring throughout the surgery, and there was no abnormal EMG activity noted throughout.  The wound was then closed in layers using #1 Vicryl followed by 2-0 Vicryl followed by 4-0 Monocryl.  Of note, a #15 deep Blake drain was placed prior to closure.  Of note, Jason CoopKayla McKenzie was my assistant throughout surgery and did aid in retraction, suctioning, and closure from start to finish.     Estill BambergMark Paighton Godette, MD     MD/MEDQ  D:  03/10/2016  T:  03/11/2016  Job:  [id number]

## 2016-03-11 NOTE — Care Management Note (Signed)
Case Management Note  Patient Details  Name: Kimberly QuakerDoris R Weeks MRN: [id number] Date of Birth: 01/14/1966  Subjective/Objective:   50 yr old female s/p L4-5, L5-S1 fusion.                Action/Plan: Patient has requested Hospital bed, states MD discussed it prior to surgery. CM placed order for MD signature and [facility]    Expected Discharge Date:   03/11/16               Expected Discharge Plan:    Home self care In-House Referral:     Discharge planning Services     Post Acute Care Choice:    Choice offered to:     DME Arranged:  Hospital bed DME Agency:  [facility]  HH Arranged:  NA HH Agency:  NA  Status of Service:  In process, will continue to follow  If discussed at Long Length of Stay Meetings, dates discussed:    Additional Comments:  Durenda GuthrieBrady, Sravya Grissom Naomi, RN 03/11/2016, 4:43 PM

## 2016-03-11 NOTE — Evaluation (Signed)
Physical Therapy Evaluation Patient Details Name: [name] MRN: [id number] DOB: 01/07/1966 Today's Date: 03/11/2016   History of Present Illness  Pt is a 50 y/o female s/p L4-S1 decompression and fusion. Pt has a past medical history of Arthritis; Chronic back pain; Diabetes mellitus without complication; Insomnia; Joint pain; Lupus; and Substance abuse..  Clinical Impression  Pt is POD #1 and is very sore and painful with cramping and pain into her right leg.  She has difficulty progressing it forward during gait. We were only able to walk a short distance around the room with the RW.  She is eager to heal and get out into the hallway tomorrow. Her right leg pain does make her anxious, but I encouraged her to stay positive that this is still early in her recovery and she will not yet be able to tell how she will feel once she has completely healed.  PT to follow acutely until d/c confirmed.       Follow Up Recommendations Home health PT;Supervision for mobility/OOB    Equipment Recommendations  Rolling walker with 5" wheels    Recommendations for Other Services   NA    Precautions / Restrictions Precautions Precautions: Back Precaution Booklet Issued: Yes (comment) Precaution Comments: BLT handout reviewed thoroughly Restrictions Weight Bearing Restrictions: No      Mobility  Bed Mobility Overal bed mobility: Needs Assistance Bed Mobility: Sit to Sidelying Rolling: Min assist (verbal cues for bending knee, and hand placement ) Sidelying to sit: Mod assist;HOB elevated     Sit to sidelying: Mod assist General bed mobility comments: Mod assist to lift both legs into bed from sitting to sidelying with HOB flat.  Pt needed significant assistance to get comfortable in the bed.   Transfers Overall transfer level: Needs assistance Equipment used: Rolling walker (2 wheeled) Transfers: Sit to/from Stand Sit to Stand: Min assist         General transfer comment: Min assist  to support trunk and stabilize RW to get to standing from low recliner chair and higher BSC in the bathroom. Pt with heavy reliance on bil arms for support during transitions.   Ambulation/Gait Ambulation/Gait assistance: Min assist Ambulation Distance (Feet): 20 Feet Assistive device: Rolling walker (2 wheeled) Gait Pattern/deviations: Step-through pattern;Shuffle;Decreased weight shift to right;Decreased step length - right Gait velocity: decreased Gait velocity interpretation: <1.8 ft/sec, indicative of risk for recurrent falls (significantly less than 1.8 ft/sec) General Gait Details: Pt with difficulty progressing right leg forward even with RW use.  Much safer with RW vs cane which was used during earlier OT session. Pt has a 4 wheeled walker at home and may need a normal RW for gait at home as the RW with a seat and 4 wheels if often not supportive enough.           Balance Overall balance assessment: Needs assistance Sitting-balance support: Feet supported;Bilateral upper extremity supported Sitting balance-Leahy Scale: Fair Sitting balance - Comments: using bil upper extrmities to unweight back and right him in sitting.    Standing balance support: Bilateral upper extremity supported Standing balance-Leahy Scale: Poor                               Pertinent Vitals/Pain Pain Assessment: 0-10 Pain Score: 8  Pain Location: low back down right leg Pain Descriptors / Indicators: Aching;Burning;Guarding;Grimacing;Cramping Pain Intervention(s): Limited activity within patient's tolerance;Monitored during session;Repositioned;RN gave pain meds during session  Home Living Family/patient expects to be discharged to:: Private residence Living Arrangements: Parent Available Help at Discharge: Family Type of Home: House Home Access: Stairs to enter Entrance Stairs-Rails: None Entrance Stairs-Number of Steps: 1 Home Layout: One level Home Equipment: Cane - single  point;Bedside commode;Shower seat;Hospital bed;Walker - 4 wheels Additional Comments: hospital bed should be coming today    Prior Function Level of Independence: Needs assistance   Gait / Transfers Assistance Needed: used SPC for ambulation  ADL's / Homemaking Assistance Needed: mom provided assist with IADL        Hand Dominance   Dominant Hand: Right    Extremity/Trunk Assessment   Upper Extremity Assessment: Defer to OT evaluation           Lower Extremity Assessment: RLE deficits/detail;LLE deficits/detail RLE Deficits / Details: both legs generally weak, but pt having more difficulty progressing right leg forward during gait. No buckling over R leg, but pt using RW this session and that may be the difference.   LLE Deficits / Details: generalized weakness.   Cervical / Trunk Assessment: Other exceptions  Communication   Communication: No difficulties  Cognition Arousal/Alertness: Awake/alert Behavior During Therapy: WFL for tasks assessed/performed Overall Cognitive Status: Within Functional Limits for tasks assessed                      General Comments General comments (skin integrity, edema, etc.): Pt keeps focusing on fear of RLE        Assessment/Plan    PT Assessment Patient needs continued PT services  PT Problem List Decreased strength;Decreased activity tolerance;Decreased balance;Decreased mobility;Decreased knowledge of use of DME;Decreased knowledge of precautions;Obesity;Pain          PT Treatment Interventions DME instruction;Gait training;Stair training;Functional mobility training;Therapeutic activities;Therapeutic exercise;Balance training;Patient/family education    PT Goals (Current goals can be found in the Care Plan section)  Acute Rehab PT Goals Patient Stated Goal: To get back to walking again PT Goal Formulation: With patient Time For Goal Achievement: 03/18/16 Potential to Achieve Goals: Good    Frequency Min  5X/week           End of Session Equipment Utilized During Treatment: Back brace Activity Tolerance: Patient limited by pain;Patient limited by fatigue Patient left: in bed;with call bell/phone within reach;with nursing/sitter in room Nurse Communication: Mobility status;Patient requests pain meds         Time: [id number] PT Time Calculation (min) (ACUTE ONLY): 27 min   Charges:   PT Evaluation $PT Eval Moderate Complexity: 1 Procedure PT Treatments $Gait Training: 8-22 mins        Aneita Kiger B. Rettie Laird, PT, DPT [phone]   03/11/2016, 5:30 PM

## 2016-03-11 NOTE — Progress Notes (Signed)
    Patient doing well Patient denies radicular leg pain Was up yesterday, not yet today   Physical Exam: Vitals:   03/10/16 2357 03/11/16 0445  BP: 140/76 119/75  Pulse: (!) 104 (!) 117  Resp: 18 18  Temp: (!) 100.5 F (38.1 C) 99.2 F (37.3 C)    Dressing in place NVI  Drain output: 80cc/12 hours  POD #1 s/p L4-S1 decompression and fusion  - up with PT/OT, encourage ambulation - Percocet for pain, Valium for muscle spasms - likely d/c home later today - maintain drain until discharge

## 2016-03-12 LAB — GLUCOSE, CAPILLARY
GLUCOSE-CAPILLARY: 183 mg/dL — AB (ref 65–99)
Glucose-Capillary: 219 mg/dL — ABNORMAL HIGH (ref 65–99)

## 2016-03-12 NOTE — Progress Notes (Signed)
    Patient doing well Patient denies radicular leg pain Was up yesterday with PT, working to get facile with brace and getting up out of bed Mildly tachycardic this AM, but normotensive Drain output sufficiently low for d/c of drain   Physical Exam: Vitals:   03/12/16 0447 03/12/16 0804  BP: (!) 117/47 120/63  Pulse: (!) 113 (!) 107  Resp:    Temp: 99.6 F (37.6 C) 98.9 F (37.2 C)    Dressing in place NVI  Drain output: 20cc/12 hours  POD #2 s/p L4-S1 decompression and fusion  - up with PT/OT, encourage ambulation - Percocet for pain, Valium for muscle spasms - d/c home later today - d/c drain  Neil Crouchave Zemira Zehring, MD Mobile [phone]

## 2016-03-12 NOTE — Discharge Summary (Signed)
Physician Discharge Summary  Patient ID: [name] MRN: [id number] DOB/AGE: 52/07/1965 52 y.o.  Admit date: 03/10/2016 Discharge date: 03/12/2016  Admission Diagnoses:  Lumbar radiculopathy  Discharge Diagnoses:  Active Problems:   Radiculopathy   Past Medical History:  Diagnosis Date  . Arthritis   . Chronic back pain    DDD  . Diabetes mellitus without complication (HCC)    takes Metformin daily  . Insomnia    takes Melatonin nightly  . Joint pain   . Lupus    takes Plaquenil daily  . Substance abuse     Surgeries: Procedure(s): RIGHT SIDED LUMBAR 4-5, LUMBAR 5-SACRUM 1 TRANSFORAMINAL LUMBAR INTERBODY FUSION WITH INSTRUMENTATION AND ALLOGRAFT on 03/10/2016   Consultants (if any):   Discharged Condition: Improved  Hospital Course: [name] is an 50 y.o. female who was admitted 03/10/2016 with a diagnosis of lumbar radiculopathy and went to the operating room on 03/10/2016 and underwent the above named procedures.    She was given perioperative antibiotics:  Anti-infectives    Start     Dose/Rate Route Frequency Ordered Stop   03/10/16 2200  hydroxychloroquine (PLAQUENIL) tablet 200 mg     200 mg Oral 2 times daily 03/10/16 1655     03/10/16 1800  vancomycin (VANCOCIN) 1,250 mg in sodium chloride 0.9 % 250 mL IVPB     1,250 mg 166.7 mL/hr over 90 Minutes Intravenous Every 12 hours 03/10/16 1719     03/10/16 0544  clindamycin (CLEOCIN) IVPB 900 mg     900 mg 100 mL/hr over 30 Minutes Intravenous On call to O.R. 03/10/16 [id number] 03/10/16 0817    .  She was given sequential compression devices, early ambulation for DVT prophylaxis.  She benefited maximally from the hospital stay and there were no complications. She will have cleared PT at time of d/c home. Plan is to d/c home later today.  Recent vital signs:  Vitals:   03/12/16 0447 03/12/16 0804  BP: (!) 117/47 120/63  Pulse: (!) 113 (!) 107  Resp:    Temp: 99.6 F (37.6 C) 98.9 F (37.2 C)     Recent laboratory studies:  Lab Results  Component Value Date   HGB 14.7 03/09/2016   HGB 13.8 05/27/2015   HGB 14.7 05/26/2015   Lab Results  Component Value Date   WBC 7.7 03/09/2016   PLT 206 03/09/2016   Lab Results  Component Value Date   INR 0.97 03/09/2016   Lab Results  Component Value Date   NA 139 03/09/2016   K 4.3 03/09/2016   CL 105 03/09/2016   CO2 24 03/09/2016   BUN 13 03/09/2016   CREATININE 0.68 03/09/2016   GLUCOSE 116 (H) 03/09/2016    Discharge Medications:     Medication List    TAKE these medications   glucose blood test strip Check sugar once daily. ICD10 E11.65   hydroxychloroquine 200 MG tablet Commonly known as:  PLAQUENIL Take 200 mg by mouth 2 (two) times daily.   Melatonin 5 MG Tabs Take 10 mg by mouth at bedtime.   metFORMIN 1000 MG tablet Commonly known as:  GLUCOPHAGE TAKE 1 TABLET BY MOUTH 2 TIMES DAILY WITH A MEAL   oxyCODONE-acetaminophen 5-325 MG tablet Commonly known as:  PERCOCET/ROXICET Take 1 tablet by mouth every 6 (six) hours as needed for severe pain.   pregabalin 150 MG capsule Commonly known as:  LYRICA Take 1 capsule (150 mg total) by mouth at bedtime.  Durable Medical Equipment        Start     Ordered   03/11/16 1639  For home use only DME Hospital bed  Once    Question Answer Comment  Patient has (list medical condition): s/p lumbar fusion L4-5, L5-1   The above medical condition requires: Patient requires the ability to reposition frequently   Head must be elevated greater than: 45 degrees   Bed type Semi-electric      03/11/16 1640      Diagnostic Studies: Dg Lumbar Spine 2-3 Views  Result Date: 03/10/2016 CLINICAL DATA:  Lumbar spine surgery. EXAM: DG C-ARM 61-120 MIN; LUMBAR SPINE - 2-3 VIEW COMPARISON:  Intraoperative lumbar spine radiograph earlier today FLUOROSCOPY TIME:  C-arm fluoroscopic images were obtained intraoperatively and submitted for post operative  interpretation. Please see the performing provider's procedural report for the fluoroscopy time utilized. FINDINGS: Three intraoperative spot fluoroscopic images of the lumbar spine are provided. These demonstrate interval fusion from L4-S1 with bilateral pedicle screws and interconnecting rods at each level. Interbody fusion devices have also been placed. IMPRESSION: Intraoperative images during L4-S1 fusion. Electronically Signed   By: Sebastian AcheAllen  Grady M.D.   On: 03/10/2016 14:27   Dg Lumbar Spine 1 View  Result Date: 03/10/2016 CLINICAL DATA:  Lateral intraoperative localization radiograph prior to TLIF of L4-5 and L5-S1. EXAM: LUMBAR SPINE - 1 VIEW COMPARISON:  Lumbar spine series of October 02, 2015 FINDINGS: The uppermost metallic trocar lines approximately 2 mm posterior to the L3 spinous process which is 7 cm posterior to the L3-L4 disc space. The lower most trocar lies approximately 3.7 cm posterior to the S1-S2 disc space. IMPRESSION: Metallic localization needles as described. Electronically Signed   By: Chauntae Hults  [place] M.D.   On: 03/10/2016 10:38   Dg C-arm 1-60 Min  Result Date: 03/10/2016 CLINICAL DATA:  Lumbar spine surgery. EXAM: DG C-ARM 61-120 MIN; LUMBAR SPINE - 2-3 VIEW COMPARISON:  Intraoperative lumbar spine radiograph earlier today FLUOROSCOPY TIME:  C-arm fluoroscopic images were obtained intraoperatively and submitted for post operative interpretation. Please see the performing provider's procedural report for the fluoroscopy time utilized. FINDINGS: Three intraoperative spot fluoroscopic images of the lumbar spine are provided. These demonstrate interval fusion from L4-S1 with bilateral pedicle screws and interconnecting rods at each level. Interbody fusion devices have also been placed. IMPRESSION: Intraoperative images during L4-S1 fusion. Electronically Signed   By: Sebastian AcheAllen  Grady M.D.   On: 03/10/2016 14:27    Disposition: 01-Home or Self Care       Signed: Kimberly Nieland  A. 03/12/2016, 9:01 AM

## 2016-03-12 NOTE — Progress Notes (Addendum)
Met with pt again. She stated that she lives with her mother who is in the ED. She wants to know when the hospital bed is going to be delivered, so she can ask someone to be there when the bed is delivered. Contacted Jamaine and left him another VM. Jamaine returned my call and he stated that the bed is going to be delivered today, but they were not able to reach someone. Explained to Golovin that pt's mother is in the hospital. Asked Dorina Hoyer if they can call her in her room. Jamaine contacted [facility] and he was informed that bed is going to be delivered between 5-9 p.m.Met with pt again and she was on the phone with [facility]. She is going home today and she has someone to pick her up. She stated that she has a walker.

## 2016-03-12 NOTE — Progress Notes (Signed)
Physical Therapy Treatment Patient Details Name: Kimberly QuakerDoris R Weeks MRN: [id number] DOB: 07/16/1965 Today's Date: 03/12/2016    History of Present Illness Pt is a 50 y/o female s/p L4-S1 decompression and fusion. Pt has a past medical history of Arthritis; Chronic back pain; Diabetes mellitus without complication; Insomnia; Joint pain; Lupus; and Substance abuse..    PT Comments    Pt presented R sidelying in bed, awake and willing to participate in therapy session. Pt progressing with distance ambulated this session; however, she continues to move at a significantly slow pace. Pt continues to report weakness in her R LE. Pt would continue to benefit from skilled physical therapy services at this time while admitted and after d/c to address her limitations in order to improve her overall safety and independence with functional mobility.   Follow Up Recommendations  Home health PT;Supervision for mobility/OOB     Equipment Recommendations  Rolling walker with 5" wheels    Recommendations for Other Services       Precautions / Restrictions Precautions Precautions: Back Restrictions Weight Bearing Restrictions: No    Mobility  Bed Mobility Overal bed mobility: Needs Assistance Bed Mobility: Sidelying to Sit   Sidelying to sit: Mod assist       General bed mobility comments: pt required increased time, use of bed rails and mod A with trunk to achieve sitting EOB  Transfers Overall transfer level: Needs assistance Equipment used: Rolling walker (2 wheeled) Transfers: Sit to/from Stand Sit to Stand: Min assist         General transfer comment: Min assist to support trunk and stabilize RW to get to standing from bed and BSC in the bathroom. Pt with heavy reliance on bil arms for support during transitions.   Ambulation/Gait Ambulation/Gait assistance: Min guard Ambulation Distance (Feet): 75 Feet Assistive device: Rolling walker (2 wheeled) Gait Pattern/deviations:  Step-through pattern;Decreased stride length;Wide base of support;Shuffle Gait velocity: decreased Gait velocity interpretation: <1.8 ft/sec, indicative of risk for recurrent falls (significantly slower that 1.8 ft/sec) General Gait Details: pt very slow with mobility, but able to advance R LE forwards easier today as compared to previous session   Stairs            Wheelchair Mobility    Modified Rankin (Stroke Patients Only)       Balance Overall balance assessment: Needs assistance Sitting-balance support: Feet supported;No upper extremity supported Sitting balance-Leahy Scale: Fair     Standing balance support: During functional activity;Bilateral upper extremity supported Standing balance-Leahy Scale: Poor Standing balance comment: pt reliant on bilateral UEs on RW                    Cognition Arousal/Alertness: Awake/alert Behavior During Therapy: WFL for tasks assessed/performed Overall Cognitive Status: Within Functional Limits for tasks assessed                      Exercises      General Comments        Pertinent Vitals/Pain Pain Assessment: 0-10 Pain Score: 4  Pain Location: back Pain Descriptors / Indicators: Sore;Aching Pain Intervention(s): Monitored during session;Repositioned    Home Living Family/patient expects to be discharged to:: Private residence Living Arrangements: Parent                  Prior Function            PT Goals (current goals can now be found in the care plan section) Acute Rehab PT  Goals Patient Stated Goal: To get back to walking again PT Goal Formulation: With patient Time For Goal Achievement: 03/18/16 Potential to Achieve Goals: Good Progress towards PT goals: Progressing toward goals    Frequency    Min 5X/week      PT Plan Current plan remains appropriate    Co-evaluation             End of Session Equipment Utilized During Treatment: Back brace Activity Tolerance:  Patient limited by fatigue Patient left: in chair;with call bell/phone within reach     Time: 0932-1010 PT Time Calculation (min) (ACUTE ONLY): 38 min  Charges:  $Gait Training: 38-52 mins                    G CodesAlessandra Bevels:      Deadra Diggins M Lowery Paullin 03/12/2016, 1:16 PM Deborah ChalkJennifer Nikiya Starn, PT, DPT [phone]

## 2016-03-12 NOTE — Progress Notes (Signed)
Pt wants to know if the hospital bed is going to be delivered today. She stated that she can't go home if she doesn't have the hospital bed, she doesn't have a bed to sleep. [facility]. He stated that he will call me back.

## 2016-03-23 ENCOUNTER — Other Ambulatory Visit: Payer: Self-pay | Admitting: Orthopedic Surgery

## 2016-03-23 ENCOUNTER — Ambulatory Visit
Admission: RE | Admit: 2016-03-23 | Discharge: 2016-03-23 | Disposition: A | Discharge status: Home / Self Care | Payer: Medicare Other | Source: Ambulatory Visit | Attending: Orthopedic Surgery | Admitting: Orthopedic Surgery

## 2016-03-23 DIAGNOSIS — M79604 Pain in right leg: Secondary | ICD-10-CM

## 2016-03-23 DIAGNOSIS — M7989 Other specified soft tissue disorders: Secondary | ICD-10-CM

## 2016-03-23 DIAGNOSIS — Z9889 Other specified postprocedural states: Secondary | ICD-10-CM | POA: Diagnosis not present

## 2016-03-23 DIAGNOSIS — R600 Localized edema: Secondary | ICD-10-CM | POA: Diagnosis not present

## 2016-04-04 DIAGNOSIS — M545 Low back pain: Secondary | ICD-10-CM | POA: Diagnosis not present

## 2016-04-04 DIAGNOSIS — Z9889 Other specified postprocedural states: Secondary | ICD-10-CM | POA: Diagnosis not present

## 2016-04-06 ENCOUNTER — Other Ambulatory Visit: Payer: Self-pay | Admitting: Orthopedic Surgery

## 2016-04-06 ENCOUNTER — Ambulatory Visit: Payer: Medicare Other | Admitting: Internal Medicine

## 2016-04-06 DIAGNOSIS — Z9889 Other specified postprocedural states: Secondary | ICD-10-CM

## 2016-04-06 DIAGNOSIS — M545 Low back pain: Secondary | ICD-10-CM

## 2016-04-07 ENCOUNTER — Ambulatory Visit (INDEPENDENT_AMBULATORY_CARE_PROVIDER_SITE_OTHER): Payer: Medicare Other | Admitting: Obstetrics and Gynecology

## 2016-04-07 ENCOUNTER — Encounter: Payer: Self-pay | Admitting: Obstetrics and Gynecology

## 2016-04-07 VITALS — BP 126/64 | HR 94 | Temp 97.5°F | Ht 63.0 in | Wt 218.0 lb

## 2016-04-07 DIAGNOSIS — R21 Rash and other nonspecific skin eruption: Secondary | ICD-10-CM

## 2016-04-07 NOTE — Patient Instructions (Addendum)
Take antihistamine like Benadryl for itching  Bedbugs Introduction Bedbugs are tiny bugs that live in and around beds. They stay hidden during the day, and they come out at night and bite. Bedbugs need blood to live and grow. Where are bedbugs found? Bedbugs can be found anywhere, whether a place is clean or dirty. They are most often found in places where many people come and go, such as hotels, shelters, dorms, and health care settings. It is also common for them to be found in homes where there are many birds or bats nearby. What are bedbug bites like? A bedbug bite leaves a small red bump with a darker red dot in the middle. The bump may appear soon after a person is bitten or a day or more later. Bedbug bites usually do not hurt, but they may itch. Most people do not need treatment for bedbug bites. The bumps usually go away on their own in a few days. How do I check for bedbugs? Bedbugs are reddish-brown, oval, and flat. They range in size from 1 mm to 7 mm and they cannot fly. Look for bedbugs in these places:  On mattresses, bed frames, headboards, and box springs.  On drapes and curtains in bedrooms.  Under carpeting in bedrooms.  Behind electrical outlets.  Behind any wallpaper that is peeling.  Inside luggage. Also look for black or red spots or stains on or near the bed. Stains can come from bedbugs that have been crushed or from bedbug waste. What should I do if I find bedbugs? When Traveling  If you find bedbugs while traveling, check all of your possessions carefully before you bring them into your home. Consider throwing away anything that has bedbugs on it. At Home  If you find bedbugs at home, your bedroom may need to be treated by a pest control expert. You may also need to throw away mattresses or luggage. To help keep bedbugs from coming back, consider taking these actions:  Put a plastic cover over your mattress.  Wash your clothes and bedding in water that is  hotter than 120F (48.9C) and dry them on a hot setting. Bedbugs are killed by high temperatures.  Vacuum often around the bed and in all of the cracks and crevices where the bugs might hide.  Carefully check all used furniture, bedding, or clothes that you bring into your home.  Eliminate bird nests and bat roosts that are near your home. In Your Bed  If you find bedbugs in your bed, consider wearing pajamas that have long sleeves and pant legs. Bedbugs usually bite areas of the skin that are not covered. This information is not intended to replace advice given to you by your health care provider. Make sure you discuss any questions you have with your health care provider. Document Released: 05/14/2010 Document Revised: 09/17/2015 Document Reviewed: 04/07/2014  2017 Elsevier

## 2016-04-07 NOTE — Progress Notes (Signed)
   Subjective:   Patient ID: [name] R Harnish, female    DOB: 01/24/1966, 50 y.o.   MRN: [id number]  Patient presents for Same Day Appointment with a chief complaint of rash.   Chief Complaint  Patient presents with  . Rash    HPI: Patient reports that she first noticed the pruritic rash on her arms, back of her neck and right leg about one week ago. Denies having any similar rash previously. No pain is associated with the rash. Denies fever, chills, nausea, vomiting, neck stiffness, photophobia. Has not recently traveled or moved homes. Several people live with her in her home and no one else has any similar symptoms. She sleeps in a hospital bed with her dog.   History  Smoking Status  . Current Every Day Smoker  . Packs/day: 1.00  . Years: 30.00  . Types: Cigarettes  Smokeless Tobacco  . Never Used    Comment: 2-3  cigs per day    Past medical history, surgical, family, and social history reviewed and updated in the EMR as appropriate.  Objective:  BP 126/64   Pulse 94   Temp 97.5 F (36.4 C) (Oral)   Wt 218 lb (98.9 kg)   LMP 10/08/2015   SpO2 99%   BMI 38.62 kg/m  Vitals and nursing note reviewed  Physical Exam   Gen: Pleasant female. NAD.  Neuro: Alert and interacting appropriately.  HEENT: No conjunctival injection. EOMI. No facial rash observed.  Skin: Multiple 2x2 mm linear papular rash on bilateral upper extremities. No lesions present on back, abdomen or face.   Assessment & Plan:  1. Rash and nonspecific skin eruption Rash most consistent with bed bugs based off symptoms and appearance. Counseled on treatment for bed bugs to include extermination. Can use benadryl for pruritis. No lesions appear to be infected. Handout given. Return precautions discussed.    Caryl AdaJazma Phelps, DO 04/07/2016, 2:21 PM PGY-3, [facility]

## 2016-04-08 ENCOUNTER — Ambulatory Visit
Admission: RE | Admit: 2016-04-08 | Discharge: 2016-04-08 | Disposition: A | Discharge status: Home / Self Care | Payer: Medicare Other | Source: Ambulatory Visit | Attending: Orthopedic Surgery | Admitting: Orthopedic Surgery

## 2016-04-08 DIAGNOSIS — Z9889 Other specified postprocedural states: Secondary | ICD-10-CM

## 2016-04-08 DIAGNOSIS — M545 Low back pain: Secondary | ICD-10-CM | POA: Diagnosis not present

## 2016-05-02 DIAGNOSIS — M545 Low back pain: Secondary | ICD-10-CM | POA: Diagnosis not present

## 2016-05-13 ENCOUNTER — Other Ambulatory Visit: Payer: Self-pay | Admitting: Obstetrics and Gynecology

## 2016-05-13 NOTE — Telephone Encounter (Signed)
Pt needs a refill on tramadol. Pt uses [facility] on [address] ep

## 2016-05-16 NOTE — Telephone Encounter (Signed)
Patient has care note stating that she is not to receive narcotic pain medicine from our clinic anymore. Will not fill script. Also medicine not on medication list.

## 2016-05-23 ENCOUNTER — Other Ambulatory Visit: Payer: Self-pay | Admitting: *Deleted

## 2016-05-23 DIAGNOSIS — M79604 Pain in right leg: Secondary | ICD-10-CM

## 2016-05-24 ENCOUNTER — Telehealth: Payer: Self-pay | Admitting: Obstetrics and Gynecology

## 2016-05-24 MED ORDER — PREGABALIN 150 MG PO CAPS: 150.0000 mg | ORAL_CAPSULE | Freq: Every day | ORAL | 0 refills | Status: DC

## 2016-05-24 NOTE — Telephone Encounter (Signed)
Voice message left on pharmacy line for Lyric refill.  Lyrica 150 mg: 1 capsule by mouth at bedtime: #90, 0 refills per Dr. Doroteo GlassmanPhelps.  Clovis PuMartin, Nyhla Mountjoy L, RN

## 2016-05-24 NOTE — Telephone Encounter (Signed)
Pt called and would like to have a refill on her Lyrica. jw

## 2016-05-25 NOTE — Telephone Encounter (Signed)
Spoke with patient and she is aware of script being called in.  States that she was unable to fill it due to her insurance saying she couldn't have it filled again until April.  Patient said she would check on this on her own. Jaron Czarnecki,CMA

## 2016-05-25 NOTE — Telephone Encounter (Signed)
Medication was refilled yesterday by phone.

## 2016-05-31 DIAGNOSIS — M5416 Radiculopathy, lumbar region: Secondary | ICD-10-CM | POA: Diagnosis not present

## 2016-05-31 DIAGNOSIS — M545 Low back pain: Secondary | ICD-10-CM | POA: Diagnosis not present

## 2016-06-01 ENCOUNTER — Other Ambulatory Visit: Payer: Self-pay | Admitting: Orthopedic Surgery

## 2016-06-01 DIAGNOSIS — M5416 Radiculopathy, lumbar region: Secondary | ICD-10-CM

## 2016-06-02 DIAGNOSIS — M4326 Fusion of spine, lumbar region: Secondary | ICD-10-CM | POA: Diagnosis not present

## 2016-06-02 DIAGNOSIS — M5416 Radiculopathy, lumbar region: Secondary | ICD-10-CM | POA: Diagnosis not present

## 2016-06-07 DIAGNOSIS — M4326 Fusion of spine, lumbar region: Secondary | ICD-10-CM | POA: Diagnosis not present

## 2016-06-07 DIAGNOSIS — M5416 Radiculopathy, lumbar region: Secondary | ICD-10-CM | POA: Diagnosis not present

## 2016-06-14 ENCOUNTER — Ambulatory Visit
Admission: RE | Admit: 2016-06-14 | Discharge: 2016-06-14 | Disposition: A | Discharge status: Home / Self Care | Payer: Medicare Other | Source: Ambulatory Visit | Attending: Orthopedic Surgery | Admitting: Orthopedic Surgery

## 2016-06-14 DIAGNOSIS — M5416 Radiculopathy, lumbar region: Secondary | ICD-10-CM | POA: Diagnosis not present

## 2016-06-14 DIAGNOSIS — M4326 Fusion of spine, lumbar region: Secondary | ICD-10-CM | POA: Diagnosis not present

## 2016-06-14 DIAGNOSIS — M5126 Other intervertebral disc displacement, lumbar region: Secondary | ICD-10-CM | POA: Diagnosis not present

## 2016-06-14 MED ORDER — GADOBENATE DIMEGLUMINE 529 MG/ML IV SOLN
20.0000 mL | Freq: Once | INTRAVENOUS | Status: AC | PRN
  Administered 2016-06-14: 20 mL via INTRAVENOUS

## 2016-06-17 DIAGNOSIS — M5416 Radiculopathy, lumbar region: Secondary | ICD-10-CM | POA: Diagnosis not present

## 2016-06-21 DIAGNOSIS — M4326 Fusion of spine, lumbar region: Secondary | ICD-10-CM | POA: Diagnosis not present

## 2016-06-21 DIAGNOSIS — M5416 Radiculopathy, lumbar region: Secondary | ICD-10-CM | POA: Diagnosis not present

## 2016-06-23 ENCOUNTER — Ambulatory Visit: Payer: Medicare Other | Admitting: Obstetrics and Gynecology

## 2016-07-12 DIAGNOSIS — M5416 Radiculopathy, lumbar region: Secondary | ICD-10-CM | POA: Diagnosis not present

## 2016-07-14 ENCOUNTER — Other Ambulatory Visit: Payer: Self-pay | Admitting: Orthopedic Surgery

## 2016-07-14 DIAGNOSIS — G8929 Other chronic pain: Secondary | ICD-10-CM

## 2016-07-14 DIAGNOSIS — M5416 Radiculopathy, lumbar region: Secondary | ICD-10-CM

## 2016-07-14 DIAGNOSIS — M545 Low back pain: Secondary | ICD-10-CM

## 2016-07-19 DIAGNOSIS — M329 Systemic lupus erythematosus, unspecified: Secondary | ICD-10-CM | POA: Diagnosis not present

## 2016-07-19 DIAGNOSIS — E119 Type 2 diabetes mellitus without complications: Secondary | ICD-10-CM | POA: Diagnosis not present

## 2016-07-19 DIAGNOSIS — Z79899 Other long term (current) drug therapy: Secondary | ICD-10-CM | POA: Diagnosis not present

## 2016-07-19 LAB — HM DIABETES EYE EXAM

## 2016-07-21 ENCOUNTER — Other Ambulatory Visit: Payer: Medicare Other

## 2016-07-25 ENCOUNTER — Ambulatory Visit
Admission: RE | Admit: 2016-07-25 | Discharge: 2016-07-25 | Disposition: A | Discharge status: Home / Self Care | Payer: Medicare Other | Source: Ambulatory Visit | Attending: Orthopedic Surgery | Admitting: Orthopedic Surgery

## 2016-07-25 VITALS — BP 125/74 | HR 84

## 2016-07-25 DIAGNOSIS — G8929 Other chronic pain: Secondary | ICD-10-CM

## 2016-07-25 DIAGNOSIS — M5126 Other intervertebral disc displacement, lumbar region: Secondary | ICD-10-CM | POA: Diagnosis not present

## 2016-07-25 DIAGNOSIS — M9979 Connective tissue and disc stenosis of intervertebral foramina of abdomen and other regions: Secondary | ICD-10-CM

## 2016-07-25 DIAGNOSIS — M545 Low back pain: Secondary | ICD-10-CM

## 2016-07-25 DIAGNOSIS — M5416 Radiculopathy, lumbar region: Secondary | ICD-10-CM

## 2016-07-25 MED ORDER — DIAZEPAM 5 MG PO TABS
10.0000 mg | ORAL_TABLET | Freq: Once | ORAL | Status: AC
  Administered 2016-07-25: 10 mg via ORAL

## 2016-07-25 MED ORDER — MEPERIDINE HCL 100 MG/ML IJ SOLN
75.0000 mg | Freq: Once | INTRAMUSCULAR | Status: AC
  Administered 2016-07-25: 75 mg via INTRAMUSCULAR

## 2016-07-25 MED ORDER — IOPAMIDOL (ISOVUE-M 200) INJECTION 41%
18.0000 mL | Freq: Once | INTRAMUSCULAR | Status: AC
  Administered 2016-07-25: 18 mL via INTRATHECAL

## 2016-07-25 MED ORDER — ONDANSETRON HCL 4 MG/2ML IJ SOLN
4.0000 mg | Freq: Once | INTRAMUSCULAR | Status: AC
  Administered 2016-07-25: 4 mg via INTRAMUSCULAR

## 2016-07-25 NOTE — Discharge Instructions (Signed)
Myelogram Discharge Instructions  1. Go home and rest quietly for the next 24 hours.  It is important to lie flat for the next 24 hours.  Get up only to go to the restroom.  You may lie in the bed or on a couch on your back, your stomach, your left side or your right side.  You may have one pillow under your head.  You may have pillows between your knees while you are on your side or under your knees while you are on your back.  2. DO NOT drive today.  Recline the seat as far back as it will go, while still wearing your seat belt, on the way home.  3. You may get up to go to the bathroom as needed.  You may sit up for 10 minutes to eat.  You may resume your normal diet and medications unless otherwise indicated.  Drink lots of extra fluids today and tomorrow.  4. The incidence of headache, nausea, or vomiting is about 5% (one in 20 patients).  If you develop a headache, lie flat and drink plenty of fluids until the headache goes away.  Caffeinated beverages may be helpful.  If you develop severe nausea and vomiting or a headache that does not go away with flat bed rest, call [phone].  5. You may resume normal activities after your 24 hours of bed rest is over; however, do not exert yourself strongly or do any heavy lifting tomorrow. If when you get up you have a headache when standing, go back to bed and force fluids for another 24 hours.  6. Call your physician for a follow-up appointment.  The results of your myelogram will be sent directly to your physician by the following day.  7. If you have any questions or if complications develop after you arrive home, please call [phone].  Discharge instructions have been explained to the patient.  The patient, or the person responsible for the patient, fully understands these instructions.        May resume tramadol on July 26, 2016, after 10:30 am.

## 2016-07-29 DIAGNOSIS — M79604 Pain in right leg: Secondary | ICD-10-CM | POA: Diagnosis not present

## 2016-07-29 DIAGNOSIS — F17210 Nicotine dependence, cigarettes, uncomplicated: Secondary | ICD-10-CM | POA: Diagnosis not present

## 2016-07-29 DIAGNOSIS — Z716 Tobacco abuse counseling: Secondary | ICD-10-CM | POA: Diagnosis not present

## 2016-08-03 DIAGNOSIS — M79604 Pain in right leg: Secondary | ICD-10-CM | POA: Diagnosis not present

## 2016-08-05 DIAGNOSIS — M79604 Pain in right leg: Secondary | ICD-10-CM | POA: Diagnosis not present

## 2016-08-05 DIAGNOSIS — M79605 Pain in left leg: Secondary | ICD-10-CM | POA: Diagnosis not present

## 2016-08-16 ENCOUNTER — Other Ambulatory Visit: Payer: Self-pay | Admitting: Family Medicine

## 2016-08-18 ENCOUNTER — Ambulatory Visit: Payer: Medicare Other

## 2016-08-19 ENCOUNTER — Ambulatory Visit: Payer: Medicare Other

## 2016-08-22 ENCOUNTER — Other Ambulatory Visit: Payer: Self-pay | Admitting: *Deleted

## 2016-08-22 MED ORDER — GLUCOSE BLOOD VI STRP: ORAL_STRIP | 2 refills | Status: DC

## 2016-09-16 DIAGNOSIS — M545 Low back pain: Secondary | ICD-10-CM | POA: Diagnosis not present

## 2016-09-21 ENCOUNTER — Other Ambulatory Visit: Payer: Self-pay | Admitting: Orthopedic Surgery

## 2016-09-21 DIAGNOSIS — M545 Low back pain, unspecified: Secondary | ICD-10-CM

## 2016-09-26 ENCOUNTER — Other Ambulatory Visit: Payer: Medicare Other

## 2016-09-26 ENCOUNTER — Inpatient Hospital Stay: Admission: RE | Admit: 2016-09-26 | Payer: Medicare Other | Source: Ambulatory Visit

## 2016-11-11 ENCOUNTER — Telehealth: Payer: Self-pay | Admitting: Internal Medicine

## 2016-11-11 NOTE — Telephone Encounter (Signed)
Unable to reach pt. No ring. - [name]

## 2016-12-05 ENCOUNTER — Telehealth: Payer: Self-pay | Admitting: Internal Medicine

## 2016-12-05 NOTE — Telephone Encounter (Signed)
Patient called and wanted to see if PCP will write her a prescription for Tizanidine 4 mg 1 tablet evert 6 to 8 hours. Dr.Dumonski wrote her a RX for this medication when she had her back surgery but she has been discharged from him. Please call [name] @ [phone].

## 2016-12-05 NOTE — Telephone Encounter (Signed)
Try to returned patient's call today. Went to [employer]. Left message asking to call back regarding her request.  Please call patient and inform her of the following. It looks like she had her surgery in November 2017. I will not be able to refill this medication right now. She would likely need to come in for a visit to discuss her concerns.

## 2016-12-06 NOTE — Telephone Encounter (Signed)
LM asking patient to call back. Lennis Korb,CMA

## 2016-12-22 ENCOUNTER — Ambulatory Visit (INDEPENDENT_AMBULATORY_CARE_PROVIDER_SITE_OTHER): Payer: Medicare Other | Admitting: Family Medicine

## 2016-12-22 ENCOUNTER — Encounter: Payer: Self-pay | Admitting: Family Medicine

## 2016-12-22 VITALS — BP 158/79 | HR 90 | Temp 98.1°F | Wt 217.0 lb

## 2016-12-22 DIAGNOSIS — L930 Discoid lupus erythematosus: Secondary | ICD-10-CM

## 2016-12-22 DIAGNOSIS — E118 Type 2 diabetes mellitus with unspecified complications: Secondary | ICD-10-CM | POA: Diagnosis present

## 2016-12-22 NOTE — Progress Notes (Signed)
   Subjective:    Patient ID: [name], female    DOB: 11/07/1965, 51 y.o.   MRN: [id number]   CC: Work letter for lupus flare  HPI: Patient is a 51 year old female who presented today requesting a work Physicist, medicalletter after missing 2 days of work for lupus flareup. Patient reported for the past 2 days she has been feeling more tired and has been sleeping about 10 hours a day. Patient has had long history of lupus and has been able to manage it over the years. Patient reports that from time to time she will have flareups limiting her activities. Patient is currently employed at [employer]. Patient would like to have the recent week of regain energy and fully recuperate. She denies any chest pain, shortness of breath, abdominal pain, headache, vision change. Patient endorses well-controlled lupus except for rare flareups.  Smoking status reviewed   ROS: all other systems were reviewed and are negative other than in the HPI   Past Medical History:  Diagnosis Date  . Arthritis   . Chronic back pain    DDD  . Diabetes mellitus without complication (HCC)    takes Metformin daily  . Insomnia    takes Melatonin nightly  . Joint pain   . Lupus    takes Plaquenil daily  . Substance abuse     Past Surgical History:  Procedure Laterality Date  . APPENDECTOMY    . TONSILLECTOMY      Past medical history, surgical, family, and social history reviewed and updated in the EMR as appropriate.  Objective:  BP (!) 158/79   Pulse 90   Temp 98.1 F (36.7 C) (Oral)   Wt 217 lb (98.4 kg)   SpO2 99%   BMI 38.44 kg/m   Vitals and nursing note reviewed  General: NAD, pleasant, able to participate in exam Cardiac: RRR, normal heart sounds, no murmurs. 2+ radial and PT pulses bilaterally Respiratory: CTAB, normal effort, No wheezes, rales or rhonchi Abdomen: soft, nontender, nondistended, no hepatic or splenomegaly, +BS Extremities: no edema or cyanosis. WWP. Skin: warm and dry, no rashes  noted Neuro: alert and oriented x4, no focal deficits Psych: Normal affect and mood   Assessment & Plan:   #Lupus flareup Work letter was given to patient, she will return to work on Monday 9/3. Patient will use rest of the week to rest up. Good understanding on worsening symptoms and appropriate steps needed to be taken. Minimal concern for patient's well-being at this point.   Lovena NeighboursAbdoulaye Ivelise Castillo, MD [facility] PGY-2

## 2016-12-29 ENCOUNTER — Telehealth: Payer: Self-pay | Admitting: Internal Medicine

## 2016-12-29 ENCOUNTER — Ambulatory Visit (INDEPENDENT_AMBULATORY_CARE_PROVIDER_SITE_OTHER): Payer: Medicare Other | Admitting: *Deleted

## 2016-12-29 ENCOUNTER — Encounter: Payer: Self-pay | Admitting: *Deleted

## 2016-12-29 VITALS — BP 148/80 | HR 80 | Temp 98.5°F | Ht 63.0 in | Wt 215.4 lb

## 2016-12-29 DIAGNOSIS — E118 Type 2 diabetes mellitus with unspecified complications: Secondary | ICD-10-CM

## 2016-12-29 DIAGNOSIS — Z1211 Encounter for screening for malignant neoplasm of colon: Secondary | ICD-10-CM

## 2016-12-29 DIAGNOSIS — Z0000 Encounter for general adult medical examination without abnormal findings: Secondary | ICD-10-CM | POA: Diagnosis not present

## 2016-12-29 DIAGNOSIS — Z1159 Encounter for screening for other viral diseases: Secondary | ICD-10-CM

## 2016-12-29 LAB — POCT UA - MICROALBUMIN
Albumin/Creatinine Ratio, Urine, POC: <30
CREATININE, POC: 100 mg/dL
Microalbumin Ur, POC: 30 mg/L

## 2016-12-29 LAB — POCT GLYCOSYLATED HEMOGLOBIN (HGB A1C): Hemoglobin A1C: 10.6

## 2016-12-29 NOTE — Telephone Encounter (Signed)
Form placed in provider's box for review.

## 2016-12-29 NOTE — Telephone Encounter (Signed)
transportation form dropped off for at front desk for completion.  Verified that patient section of form has been completed.  Last DOS with PCP was 04/07/16 [name](Phelps).  Placed form in team folder to be completed by clinical staff.  Chari ManningLynette D Sells

## 2016-12-29 NOTE — Patient Instructions (Addendum)
 Diabetes and Foot Care Diabetes may cause you to have problems because of poor blood supply (circulation) to your feet and legs. This may cause the skin on your feet to become thinner, break easier, and heal more slowly. Your skin may become dry, and the skin may peel and crack. You may also have nerve damage in your legs and feet causing decreased feeling in them. You may not notice minor injuries to your feet that could lead to infections or more serious problems. Taking care of your feet is one of the most important things you can do for yourself. Follow these instructions at home:  Wear shoes at all times, even in the house. Do not go barefoot. Bare feet are easily injured.  Check your feet daily for blisters, cuts, and redness. If you cannot see the bottom of your feet, use a mirror or ask someone for help.  Wash your feet with warm water (do not use hot water) and mild soap. Then pat your feet and the areas between your toes until they are completely dry. Do not soak your feet as this can dry your skin.  Apply a moisturizing lotion or petroleum jelly (that does not contain alcohol and is unscented) to the skin on your feet and to dry, brittle toenails. Do not apply lotion between your toes.  Trim your toenails straight across. Do not dig under them or around the cuticle. File the edges of your nails with an emery board or nail file.  Do not cut corns or calluses or try to remove them with medicine.  Wear clean socks or stockings every day. Make sure they are not too tight. Do not wear knee-high stockings since they may decrease blood flow to your legs.  Wear shoes that fit properly and have enough cushioning. To break in new shoes, wear them for just a few hours a day. This prevents you from injuring your feet. Always look in your shoes before you put them on to be sure there are no objects inside.  Do not cross your legs. This may decrease the blood flow to your feet.  If you find a  minor scrape, cut, or break in the skin on your feet, keep it and the skin around it clean and dry. These areas may be cleansed with mild soap and water. Do not cleanse the area with peroxide, alcohol, or iodine.  When you remove an adhesive bandage, be sure not to damage the skin around it.  If you have a wound, look at it several times a day to make sure it is healing.  Do not use heating pads or hot water bottles. They may burn your skin. If you have lost feeling in your feet or legs, you may not know it is happening until it is too late.  Make sure your health care provider performs a complete foot exam at least annually or more often if you have foot problems. Report any cuts, sores, or bruises to your health care provider immediately. Contact a health care provider if:  You have an injury that is not healing.  You have cuts or breaks in the skin.  You have an ingrown nail.  You notice redness on your legs or feet.  You feel burning or tingling in your legs or feet.  You have pain or cramps in your legs and feet.  Your legs or feet are numb.  Your feet always feel cold. Get help right away if:  There is   increasing redness, swelling, or pain in or around a wound.  There is a red line that goes up your leg.  Pus is coming from a wound.  You develop a fever or as directed by your health care provider.  You notice a bad smell coming from an ulcer or wound. This information is not intended to replace advice given to you by your health care provider. Make sure you discuss any questions you have with your health care provider. Document Released: 04/08/2000 Document Revised: 09/17/2015 Document Reviewed: 09/18/2012 Elsevier Interactive Patient Education  2017 [place] Prevention in the Home Falls can cause injuries. They can happen to people of all ages. There are many things you can do to make your home safe and to help prevent falls. What can I do on the  outside of my home?  Regularly fix the edges of walkways and driveways and fix any cracks.  Remove anything that might make you trip as you walk through a door, such as a raised step or threshold.  Trim any bushes or trees on the path to your home.  Use bright outdoor lighting.  Clear any walking paths of anything that might make someone trip, such as rocks or tools.  Regularly check to see if handrails are loose or broken. Make sure that both sides of any steps have handrails.  Any raised decks and porches should have guardrails on the edges.  Have any leaves, snow, or ice cleared regularly.  Use sand or salt on walking paths during winter.  Clean up any spills in your garage right away. This includes oil or grease spills. What can I do in the bathroom?  Use night lights.  Install grab bars by the toilet and in the tub and shower. Do not use towel bars as grab bars.  Use non-skid mats or decals in the tub or shower.  If you need to sit down in the shower, use a plastic, non-slip stool.  Keep the floor dry. Clean up any water that spills on the floor as soon as it happens.  Remove soap buildup in the tub or shower regularly.  Attach bath mats securely with double-sided non-slip rug tape.  Do not have throw rugs and other things on the floor that can make you trip. What can I do in the bedroom?  Use night lights.  Make sure that you have a light by your bed that is easy to reach.  Do not use any sheets or blankets that are too big for your bed. They should not hang down onto the floor.  Have a firm chair that has side arms. You can use this for support while you get dressed.  Do not have throw rugs and other things on the floor that can make you trip. What can I do in the kitchen?  Clean up any spills right away.  Avoid walking on wet floors.  Keep items that you use a lot in easy-to-reach places.  If you need to reach something above you, use a strong step  stool that has a grab bar.  Keep electrical cords out of the way.  Do not use floor polish or wax that makes floors slippery. If you must use wax, use non-skid floor wax.  Do not have throw rugs and other things on the floor that can make you trip. What can I do with my stairs?  Do not leave any items on the stairs.  Make sure that there  are handrails on both sides of the stairs and use them. Fix handrails that are broken or loose. Make sure that handrails are as long as the stairways.  Check any carpeting to make sure that it is firmly attached to the stairs. Fix any carpet that is loose or worn.  Avoid having throw rugs at the top or bottom of the stairs. If you do have throw rugs, attach them to the floor with carpet tape.  Make sure that you have a light switch at the top of the stairs and the bottom of the stairs. If you do not have them, ask someone to add them for you. What else can I do to help prevent falls?  Wear shoes that: ? Do not have high heels. ? Have rubber bottoms. ? Are comfortable and fit you well. ? Are closed at the toe. Do not wear sandals.  If you use a stepladder: ? Make sure that it is fully opened. Do not climb a closed stepladder. ? Make sure that both sides of the stepladder are locked into place. ? Ask someone to hold it for you, if possible.  Clearly mark and make sure that you can see: ? Any grab bars or handrails. ? First and last steps. ? Where the edge of each step is.  Use tools that help you move around (mobility aids) if they are needed. These include: ? Canes. ? Walkers. ? Scooters. ? Crutches.  Turn on the lights when you go into a dark area. Replace any light bulbs as soon as they burn out.  Set up your furniture so you have a clear path. Avoid moving your furniture around.  If any of your floors are uneven, fix them.  If there are any pets around you, be aware of where they are.  Review your medicines with your doctor. Some  medicines can make you feel dizzy. This can increase your chance of falling. Ask your doctor what other things that you can do to help prevent falls. This information is not intended to replace advice given to you by your health care provider. Make sure you discuss any questions you have with your health care provider. Document Released: 02/05/2009 Document Revised: 09/17/2015 Document Reviewed: 05/16/2014 Elsevier Interactive Patient Education  2018 [place] Maintenance, Female Adopting a healthy lifestyle and getting preventive care can go a long way to promote health and wellness. Talk with your health care provider about what schedule of regular examinations is right for you. This is a good chance for you to check in with your provider about disease prevention and staying healthy. In between checkups, there are plenty of things you can do on your own. Experts have done a lot of research about which lifestyle changes and preventive measures are most likely to keep you healthy. Ask your health care provider for more information. Weight and diet Eat a healthy diet  Be sure to include plenty of vegetables, fruits, low-fat dairy products, and lean protein.  Do not eat a lot of foods high in solid fats, added sugars, or salt.  Get regular exercise. This is one of the most important things you can do for your health. ? Most adults should exercise for at least 150 minutes each week. The exercise should increase your heart rate and make you sweat (moderate-intensity exercise). ? Most adults should also do strengthening exercises at least twice a week. This is in addition to the moderate-intensity exercise.  Maintain a healthy weight  Body mass index (BMI) is a measurement that can be used to identify possible weight problems. It estimates body fat based on height and weight. Your health care provider can help determine your BMI and help you achieve or maintain a healthy weight.  For  females 86 years of age and older: ? A BMI below 18.5 is considered underweight. ? A BMI of 18.5 to 24.9 is normal. ? A BMI of 25 to 29.9 is considered overweight. ? A BMI of 30 and above is considered obese.  Watch levels of cholesterol and blood lipids  You should start having your blood tested for lipids and cholesterol at 53 years of age, then have this test every 5 years.  You may need to have your cholesterol levels checked more often if: ? Your lipid or cholesterol levels are high. ? You are older than 53 years of age. ? You are at high risk for heart disease.  Cancer screening Lung Cancer  Lung cancer screening is recommended for adults 45-74 years old who are at high risk for lung cancer because of a history of smoking.  A yearly low-dose CT scan of the lungs is recommended for people who: ? Currently smoke. ? Have quit within the past 15 years. ? Have at least a 30-pack-year history of smoking. A pack year is smoking an average of one pack of cigarettes a day for 1 year.  Yearly screening should continue until it has been 15 years since you quit.  Yearly screening should stop if you develop a health problem that would prevent you from having lung cancer treatment.  Breast Cancer  Practice breast self-awareness. This means understanding how your breasts normally appear and feel.  It also means doing regular breast self-exams. Let your health care provider know about any changes, no matter how small.  If you are in your 20s or 30s, you should have a clinical breast exam (CBE) by a health care provider every 1-3 years as part of a regular health exam.  If you are 75 or older, have a CBE every year. Also consider having a breast X-ray (mammogram) every year.  If you have a family history of breast cancer, talk to your health care provider about genetic screening.  If you are at high risk for breast cancer, talk to your health care provider about having an MRI and a  mammogram every year.  Breast cancer gene (BRCA) assessment is recommended for women who have family members with BRCA-related cancers. BRCA-related cancers include: ? Breast. ? Ovarian. ? Tubal. ? Peritoneal cancers.  Results of the assessment will determine the need for genetic counseling and BRCA1 and BRCA2 testing.  Cervical Cancer Your health care provider may recommend that you be screened regularly for cancer of the pelvic organs (ovaries, uterus, and vagina). This screening involves a pelvic examination, including checking for microscopic changes to the surface of your cervix (Pap test). You may be encouraged to have this screening done every 3 years, beginning at age 77.  For women ages 37-65, health care providers may recommend pelvic exams and Pap testing every 3 years, or they may recommend the Pap and pelvic exam, combined with testing for human papilloma virus (HPV), every 5 years. Some types of HPV increase your risk of cervical cancer. Testing for HPV may also be done on women of any age with unclear Pap test results.  Other health care providers may not recommend any screening for nonpregnant women who are considered low risk  for pelvic cancer and who do not have symptoms. Ask your health care provider if a screening pelvic exam is right for you.  If you have had past treatment for cervical cancer or a condition that could lead to cancer, you need Pap tests and screening for cancer for at least 20 years after your treatment. If Pap tests have been discontinued, your risk factors (such as having a new sexual partner) need to be reassessed to determine if screening should resume. Some women have medical problems that increase the chance of getting cervical cancer. In these cases, your health care provider may recommend more frequent screening and Pap tests.  Colorectal Cancer  This type of cancer can be detected and often prevented.  Routine colorectal cancer screening usually  begins at 53 years of age and continues through 53 years of age.  Your health care provider may recommend screening at an earlier age if you have risk factors for colon cancer.  Your health care provider may also recommend using home test kits to check for hidden blood in the stool.  A small camera at the end of a tube can be used to examine your colon directly (sigmoidoscopy or colonoscopy). This is done to check for the earliest forms of colorectal cancer.  Routine screening usually begins at age 79.  Direct examination of the colon should be repeated every 5-10 years through 53 years of age. However, you may need to be screened more often if early forms of precancerous polyps or small growths are found.  Skin Cancer  Check your skin from head to toe regularly.  Tell your health care provider about any new moles or changes in moles, especially if there is a change in a mole's shape or color.  Also tell your health care provider if you have a mole that is larger than the size of a pencil eraser.  Always use sunscreen. Apply sunscreen liberally and repeatedly throughout the day.  Protect yourself by wearing long sleeves, pants, a wide-brimmed hat, and sunglasses whenever you are outside.  Heart disease, diabetes, and high blood pressure  High blood pressure causes heart disease and increases the risk of stroke. High blood pressure is more likely to develop in: ? People who have blood pressure in the high end of the normal range (130-139/85-89 mm Hg). ? People who are overweight or obese. ? People who are African American.  If you are 13-10 years of age, have your blood pressure checked every 3-5 years. If you are 15 years of age or older, have your blood pressure checked every year. You should have your blood pressure measured twice-once when you are at a hospital or clinic, and once when you are not at a hospital or clinic. Record the average of the two measurements. To check your  blood pressure when you are not at a hospital or clinic, you can use: ? An automated blood pressure machine at a pharmacy. ? A home blood pressure monitor.  If you are between 62 years and 34 years old, ask your health care provider if you should take aspirin to prevent strokes.  Have regular diabetes screenings. This involves taking a blood sample to check your fasting blood sugar level. ? If you are at a normal weight and have a low risk for diabetes, have this test once every three years after 53 years of age. ? If you are overweight and have a high risk for diabetes, consider being tested at a younger age or  more often. Preventing infection Hepatitis B  If you have a higher risk for hepatitis B, you should be screened for this virus. You are considered at high risk for hepatitis B if: ? You were born in a country where hepatitis B is common. Ask your health care provider which countries are considered high risk. ? Your parents were born in a high-risk country, and you have not been immunized against hepatitis B (hepatitis B vaccine). ? You have HIV or AIDS. ? You use needles to inject street drugs. ? You live with someone who has hepatitis B. ? You have had sex with someone who has hepatitis B. ? You get hemodialysis treatment. ? You take certain medicines for conditions, including cancer, organ transplantation, and autoimmune conditions.  Hepatitis C  Blood testing is recommended for: ? Everyone born from [birth year] through 1967. ? Anyone with known risk factors for hepatitis C.  Sexually transmitted infections (STIs)  You should be screened for sexually transmitted infections (STIs) including gonorrhea and chlamydia if: ? You are sexually active and are younger than 53 years of age. ? You are older than 53 years of age and your health care provider tells you that you are at risk for this type of infection. ? Your sexual activity has changed since you were last screened and you are at  an increased risk for chlamydia or gonorrhea. Ask your health care provider if you are at risk.  If you do not have HIV, but are at risk, it may be recommended that you take a prescription medicine daily to prevent HIV infection. This is called pre-exposure prophylaxis (PrEP). You are considered at risk if: ? You are sexually active and do not regularly use condoms or know the HIV status of your partner(s). ? You take drugs by injection. ? You are sexually active with a partner who has HIV.  Talk with your health care provider about whether you are at high risk of being infected with HIV. If you choose to begin PrEP, you should first be tested for HIV. You should then be tested every 3 months for as long as you are taking PrEP. Pregnancy  If you are premenopausal and you may become pregnant, ask your health care provider about preconception counseling.  If you may become pregnant, take 400 to 800 micrograms (mcg) of folic acid every day.  If you want to prevent pregnancy, talk to your health care provider about birth control (contraception). Osteoporosis and menopause  Osteoporosis is a disease in which the bones lose minerals and strength with aging. This can result in serious bone fractures. Your risk for osteoporosis can be identified using a bone density scan.  If you are 52 years of age or older, or if you are at risk for osteoporosis and fractures, ask your health care provider if you should be screened.  Ask your health care provider whether you should take a calcium or vitamin D supplement to lower your risk for osteoporosis.  Menopause may have certain physical symptoms and risks.  Hormone replacement therapy may reduce some of these symptoms and risks. Talk to your health care provider about whether hormone replacement therapy is right for you. Follow these instructions at home:  Schedule regular health, dental, and eye exams.  Stay current with your immunizations.  Do not  use any tobacco products including cigarettes, chewing tobacco, or electronic cigarettes.  If you are pregnant, do not drink alcohol.  If you are breastfeeding, limit how much and how  often you drink alcohol.  Limit alcohol intake to no more than 1 drink per day for nonpregnant women. One drink equals 12 ounces of beer, 5 ounces of wine, or 1 ounces of hard liquor.  Do not use street drugs.  Do not share needles.  Ask your health care provider for help if you need support or information about quitting drugs.  Tell your health care provider if you often feel depressed.  Tell your health care provider if you have ever been abused or do not feel safe at home. This information is not intended to replace advice given to you by your health care provider. Make sure you discuss any questions you have with your health care provider. Document Released: 10/25/2010 Document Revised: 09/17/2015 Document Reviewed: 01/13/2015 Elsevier Interactive Patient Education  2018 [employer].   Steps to Quit Smoking Smoking tobacco can be harmful to your health and can affect almost every organ in your body. Smoking puts you, and those around you, at risk for developing many serious chronic diseases. Quitting smoking is difficult, but it is one of the best things that you can do for your health. It is never too late to quit. What are the benefits of quitting smoking? When you quit smoking, you lower your risk of developing serious diseases and conditions, such as:  Lung cancer or lung disease, such as COPD.  Heart disease.  Stroke.  Heart attack.  Infertility.  Osteoporosis and bone fractures.  Additionally, symptoms such as coughing, wheezing, and shortness of breath may get better when you quit. You may also find that you get sick less often because your body is stronger at fighting off colds and infections. If you are pregnant, quitting smoking can help to reduce your chances of having a baby of  low birth weight. How do I get ready to quit? When you decide to quit smoking, create a plan to make sure that you are successful. Before you quit:  Pick a date to quit. Set a date within the next two weeks to give you time to prepare.  Write down the reasons why you are quitting. Keep this list in places where you will see it often, such as on your bathroom mirror or in your car or wallet.  Identify the people, places, things, and activities that make you want to smoke (triggers) and avoid them. Make sure to take these actions: ? Throw away all cigarettes at home, at work, and in your car. ? Throw away smoking accessories, such as Set designer. ? Clean your car and make sure to empty the ashtray. ? Clean your home, including curtains and carpets.  Tell your family, friends, and coworkers that you are quitting. Support from your loved ones can make quitting easier.  Talk with your health care provider about your options for quitting smoking.  Find out what treatment options are covered by your health insurance.  What strategies can I use to quit smoking? Talk with your healthcare provider about different strategies to quit smoking. Some strategies include:  Quitting smoking altogether instead of gradually lessening how much you smoke over a period of time. Research shows that quitting "cold [place]" is more successful than gradually quitting.  Attending in-person counseling to help you build problem-solving skills. You are more likely to have success in quitting if you attend several counseling sessions. Even short sessions of 10 minutes can be effective.  Finding resources and support systems that can help you to quit smoking and remain  smoke-free after you quit. These resources are most helpful when you use them often. They can include: ? Online chats with a Social worker. ? Telephone quitlines. ? Careers information officer. ? Support groups or group counseling. ? Text  messaging programs. ? Mobile phone applications.  Taking medicines to help you quit smoking. (If you are pregnant or breastfeeding, talk with your health care provider first.) Some medicines contain nicotine and some do not. Both types of medicines help with cravings, but the medicines that include nicotine help to relieve withdrawal symptoms. Your health care provider may recommend: ? Nicotine patches, gum, or lozenges. ? Nicotine inhalers or sprays. ? Non-nicotine medicine that is taken by mouth.  Talk with your health care provider about combining strategies, such as taking medicines while you are also receiving in-person counseling. Using these two strategies together makes you more likely to succeed in quitting than if you used either strategy on its own. If you are pregnant or breastfeeding, talk with your health care provider about finding counseling or other support strategies to quit smoking. Do not take medicine to help you quit smoking unless told to do so by your health care provider. What things can I do to make it easier to quit? Quitting smoking might feel overwhelming at first, but there is a lot that you can do to make it easier. Take these important actions:  Reach out to your family and friends and ask that they support and encourage you during this time. Call telephone quitlines, reach out to support groups, or work with a counselor for support.  Ask people who smoke to avoid smoking around you.  Avoid places that trigger you to smoke, such as bars, parties, or smoke-break areas at work.  Spend time around people who do not smoke.  Lessen stress in your life, because stress can be a smoking trigger for some people. To lessen stress, try: ? Exercising regularly. ? Deep-breathing exercises. ? Yoga. ? Meditating. ? Performing a body scan. This involves closing your eyes, scanning your body from head to toe, and noticing which parts of your body are particularly tense.  Purposefully relax the muscles in those areas.  Download or purchase mobile phone or tablet apps (applications) that can help you stick to your quit plan by providing reminders, tips, and encouragement. There are many free apps, such as QuitGuide from the [employer] Office manager for Disease Control and Prevention). You can find other support for quitting smoking (smoking cessation) through smokefree.gov and other websites.  How will I feel when I quit smoking? Within the first 24 hours of quitting smoking, you may start to feel some withdrawal symptoms. These symptoms are usually most noticeable 2-3 days after quitting, but they usually do not last beyond 2-3 weeks. Changes or symptoms that you might experience include:  Mood swings.  Restlessness, anxiety, or irritation.  Difficulty concentrating.  Dizziness.  Strong cravings for sugary foods in addition to nicotine.  Mild weight gain.  Constipation.  Nausea.  Coughing or a sore throat.  Changes in how your medicines work in your body.  A depressed mood.  Difficulty sleeping (insomnia).  After the first 2-3 weeks of quitting, you may start to notice more positive results, such as:  Improved sense of smell and taste.  Decreased coughing and sore throat.  Slower heart rate.  Lower blood pressure.  Clearer skin.  The ability to breathe more easily.  Fewer sick days.  Quitting smoking is very challenging for most people. Do not get  discouraged if you are not successful the first time. Some people need to make many attempts to quit before they achieve long-term success. Do your best to stick to your quit plan, and talk with your health care provider if you have any questions or concerns. This information is not intended to replace advice given to you by your health care provider. Make sure you discuss any questions you have with your health care provider. Document Released: 04/05/2001 Document Revised: 12/08/2015 Document Reviewed:  08/26/2014 Elsevier Interactive Patient Education  2017 [employer].

## 2016-12-29 NOTE — Progress Notes (Signed)
Subjective:   [name] is a 51 y.o. female who presents for an Initial Medicare Annual Wellness Visit.  Cardiac Risk Factors include: diabetes mellitus;hypertension;obesity (BMI >30kg/m2);sedentary lifestyle;smoking/ tobacco exposure     Objective:    Today's Vitals   12/29/16 1339  BP: (!) 148/80  Pulse: 80  Temp: 98.5 F (36.9 C)  TempSrc: Oral  SpO2: 98%  Weight: 215 lb 6.4 oz (97.7 kg)  Height:  (1.6 m)  PainSc: 4    Body mass index is 38.16 kg/m.   Current Medications (verified) Outpatient Encounter Prescriptions as of 12/29/2016  Medication Sig  . baclofen (LIORESAL) 10 MG tablet   . glucose blood (ONETOUCH VERIO) test strip Use as instructed to check blood sugar once daily. ICD-10 code: E11.65.  . hydroxychloroquine (PLAQUENIL) 200 MG tablet Take 200 mg by mouth 2 (two) times daily.   . Melatonin 5 MG TABS Take 10 mg by mouth at bedtime.  . meloxicam (MOBIC) 15 MG tablet   . pregabalin (LYRICA) 150 MG capsule Take 1 capsule (150 mg total) by mouth at bedtime.  . metFORMIN (GLUCOPHAGE) 1000 MG tablet TAKE 1 TABLET BY MOUTH 2 TIMES DAILY WITH A MEAL (Patient not taking: Reported on 12/29/2016)  . [DISCONTINUED] oxyCODONE-acetaminophen (PERCOCET/ROXICET) 5-325 MG tablet Take 1 tablet by mouth every 4 (four) hours as needed for severe pain (every 4-6 hours).   No facility-administered encounter medications on file as of 12/29/2016.     Allergies (verified) Penicillins and Norco [hydrocodone-acetaminophen]   History: Past Medical History:  Diagnosis Date  . Arthritis   . Chronic back pain    DDD  . Diabetes mellitus without complication (HCC)    takes Metformin daily  . Insomnia    takes Melatonin nightly  . Joint pain   . Lupus    takes Plaquenil daily  . Substance abuse    Past Surgical History:  Procedure Laterality Date  . APPENDECTOMY    . SPINE SURGERY  03/10/2016   Spinal Fusion L5-S1  . TONSILLECTOMY     Family History  Problem  Relation Age of Onset  . Hypertension Mother   . Kidney disease Mother   . Heart disease Father    Social History   Occupational History  . Not on file.   Social History Main Topics  . Smoking status: Current Every Day Smoker    Packs/day: 1.00    Years: 15.00    Types: Cigarettes  . Smokeless tobacco: Never Used     Comment: Smoking 6-10 cigs per day  . Alcohol use No  . Drug use: No  . Sexual activity: Not Currently    Tobacco Counseling Ready to quit: No Counseling given: Yes Patient not interested in resources such as 1-800-QUIT-NOW  Activities of Daily Living In your present state of health, do you have any difficulty performing the following activities: 12/29/2016 03/12/2016  Hearing? N N  Vision? N N  Difficulty concentrating or making decisions? N N  Walking or climbing stairs? Y Y  Dressing or bathing? N N  Doing errands, shopping? N N  Preparing Food and eating ? N -  Using the Toilet? N -  In the past six months, have you accidently leaked urine? Y -  Do you have problems with loss of bowel control? N -  Managing your Medications? Y -  Managing your Finances? Y -  Housekeeping or managing your Housekeeping? N -  Some recent data might be hidden  Home Safety:  My home has a working smoke alarm:  Yes X 2           My home throw rugs have been fastened down to the floor or removed:  Non-slip backs I have a non-slip surface or non-slip mats in the bathtub and shower:  Yes and shower chair and grab bar        All my home's stairs have handrails, including any outdoor stairs  One level home with 4 outside steps with handrails both sides.          My home's floors, stairs and hallways are free from clutter, wires and cords:  Yes     I have animals in my home  No I wear seatbelts consistently:  Yes    Immunizations and Health Maintenance Immunization History  Administered Date(s) Administered  . Influenza Whole 03/12/2009, 03/21/2013   Health Maintenance  Due  Topic Date Due  . Hepatitis C Screening  Dec 05, 1965  . PNEUMOCOCCAL POLYSACCHARIDE VACCINE (1) 11/26/1965  . MAMMOGRAM  11/26/2013  . COLONOSCOPY  11/26/2013  . FOOT EXAM  08/18/2015  . OPHTHALMOLOGY EXAM  06/23/2016  . HEMOGLOBIN A1C  09/06/2016  . URINE MICROALBUMIN  09/23/2016  . INFLUENZA VACCINE  11/23/2016   Hep C drawn today Urine microalbumin done today = WNL Hgb A1c = 10.6 Last A1c 8 on 03/09/2016 Patient stopped metformin 3-4 months ago but is now motivated to get back on track Patient declined flu and pneumonia vaccines Eye exam done at Dr. Laruth Bouchard Feb 2018. ROI signed to obtain record. Patient prefers to do FOBT vs colonoscopy. Future order placed. Reports mammo and bone density done 03/2016 as part of research study Foot exam done today.  Diabetic Foot Exam - Simple   Simple Foot Form Diabetic Foot exam was performed with the following findings:  Yes 12/29/2016  2:47 PM  Visual Inspection No deformities, no ulcerations, no other skin breakdown bilaterally:  Yes Sensation Testing Intact to touch and monofilament testing bilaterally:  Yes Pulse Check Posterior Tibialis and Dorsalis pulse intact bilaterally:  Yes Comments      Patient Care Team: Palma Holter, MD as PCP - General (Family Medicine) Dr. Dione Booze for Opthalmology  Indicate any recent Medical Services you may have received from other than [facility] providers in the past year (date may be approximate).     Assessment:   This is a routine wellness examination for [name].   Hearing/Vision screen  Hearing Screening   Method: Audiometry             Right ear:   Left ear:   Dietary issues and exercise activities discussed: Current Exercise Habits: Home exercise routine, Type of exercise: walking, Time (Minutes): 10, Frequency (Times/Week): 4, Weekly Exercise (Minutes/Week): 40, Intensity: Mild, Exercise limited  by: orthopedic condition(s);Other - see comments;neurologic condition(s) (Lupus)  Goals    . Exercise 5x per week (30 min per time) (pt-stated)          Walking    . Weight (lb) < 200 lb (90.7 kg) (pt-stated)      Depression Screen PHQ 2/9 Scores 12/29/2016 12/22/2016 04/07/2016 03/02/2016 02/08/2016 02/05/2016 10/02/2015  PHQ - 2 Score 0 0 0 0 0 0 0  Exception Documentation - - - - Other- indicate reason in comment box - -   Behavioral Health info given   Fall Risk Fall Risk  12/29/2016 12/29/2016 02/08/2016 10/02/2015 08/12/2015  Falls in the past year? - Yes No Yes No  Number falls in past yr: - 2 or more - 1 -  Injury with Fall? - Yes - No -  Comment - vertebral collapse - - -  Risk Factor Category  (No Data) High Fall Risk - - -  Comment PCP notified - - - -  Risk for fall due to : - History of fall(s);Impaired balance/gait;Impaired mobility Other (Comment) Impaired balance/gait -  Follow up - Falls evaluation completed;Education provided;Falls prevention discussed - - -   TUG Test:  Done in 15 seconds. Patient used both hands to push out of chair and one hand to sit back down. Falls prevention discussed in detail and literature given.  Cognitive Function: Mini-Cog  Passed with score 4/5   Screening Tests Health Maintenance  Topic Date Due  . Hepatitis C Screening  12-10-1965  . PNEUMOCOCCAL POLYSACCHARIDE VACCINE (1) 11/26/1965  . MAMMOGRAM  11/26/2013  . COLONOSCOPY  11/26/2013  . FOOT EXAM  08/18/2015  . OPHTHALMOLOGY EXAM  06/23/2016  . HEMOGLOBIN A1C  09/06/2016  . URINE MICROALBUMIN  09/23/2016  . INFLUENZA VACCINE  11/23/2016  . PAP SMEAR  06/23/2017  . TETANUS/TDAP  06/05/2021  . HIV Screening  Completed      Plan:     Patient encouraged to schedule OV to meet new PCP Hep C drawn today Urine microalbumin done today = WNL Hgb A1c = 10.6 Last A1c 8 on 03/09/2016 (Patient stopped metformin 3-4 months ago but is now motivated to get back on track) Patient  declined flu and pneumonia vaccines Eye exam done [facility] Feb 2018. ROI signed to obtain record. Patient prefers to do FOBT vs colonoscopy. Future order placed. Reports mammo and bone density done 03/2016 as part of research study Foot exam done today.   I have personally reviewed and noted the following in the patient's chart:   . Medical and social history . Use of alcohol, tobacco or illicit drugs  . Current medications and supplements . Functional ability and status . Nutritional status . Physical activity . Advanced directives . List of other physicians . Hospitalizations, surgeries, and ER visits in previous 12 months . Vitals . Screenings to include cognitive, depression, and falls . Referrals and appointments  In addition, I have reviewed and discussed with patient certain preventive protocols, quality metrics, and best practice recommendations. A written personalized care plan for preventive services as well as general preventive health recommendations were provided to patient.     Fredderick SeveranceDUCATTE, Shavonn Convey L, RN   12/29/2016

## 2016-12-29 NOTE — Telephone Encounter (Signed)
Patient needs to be seen in order for me to complete forms.

## 2016-12-30 LAB — HEPATITIS C ANTIBODY

## 2016-12-30 NOTE — Telephone Encounter (Signed)
Left VM asking for patient to call me back. When pt calls back please assist her in scheduling apt with pcp.

## 2017-01-18 NOTE — Progress Notes (Signed)
I have reviewed this visit and discussed with Lauren Ducatte, RN, BSN, and agree with her documentation.   

## 2017-01-23 DIAGNOSIS — M064 Inflammatory polyarthropathy: Secondary | ICD-10-CM | POA: Diagnosis not present

## 2017-01-23 DIAGNOSIS — M545 Low back pain: Secondary | ICD-10-CM | POA: Diagnosis not present

## 2017-01-23 DIAGNOSIS — M329 Systemic lupus erythematosus, unspecified: Secondary | ICD-10-CM | POA: Diagnosis not present

## 2017-01-23 DIAGNOSIS — I313 Pericardial effusion (noninflammatory): Secondary | ICD-10-CM | POA: Diagnosis not present

## 2017-01-23 DIAGNOSIS — M2550 Pain in unspecified joint: Secondary | ICD-10-CM | POA: Diagnosis not present

## 2017-01-31 ENCOUNTER — Encounter: Payer: Self-pay | Admitting: Internal Medicine

## 2017-03-02 ENCOUNTER — Telehealth: Payer: Self-pay | Admitting: Internal Medicine

## 2017-03-02 NOTE — Telephone Encounter (Signed)
Will forward to Lauren to see if placard was placed up front or if provider needs to fill out another.  Also will see what the turn around time might be on that check. Jazmin Hartsell,CMA

## 2017-03-02 NOTE — Telephone Encounter (Signed)
Pt brought form for handicap placard at her visit in Sept. She hasnt been contacted about picking it up. Please advise  She also hasnt received her  check for completing the wellnes visit.  Please call her cell phone first

## 2017-03-03 NOTE — Telephone Encounter (Signed)
Patient brought form to front desk at the end of AWV. Patient had yet to meet new PCP and was asked to schedule OV with her. PCP declined to sign form until after seeing patient (please see phone note from 9/6). Patient will need to schedule OV with new PCP for form to be completed.  Patient will need to contact her insurance company to see when to expect check as it will come from them. Kinnie FeilL. Adara Kittle, RN, BSN

## 2017-03-03 NOTE — Telephone Encounter (Signed)
LM for patient to call back.  Please relay message to patient from RN when she calls back.  Thanks Limited BrandsJazmin Terrance Usery,CMA

## 2017-03-11 IMAGING — CT CT ANGIO CHEST
2 of 8 series · 18 of 46 positions shown · IV contrast (omnipaque)
Comparison: 11/26/2009

CLINICAL DATA: Back pain

EXAM:
CT ANGIOGRAPHY CHEST WITH CONTRAST
TECHNIQUE: Multidetector CT imaging of the chest was performed using the
standard protocol during bolus administration of intravenous
contrast. Multiplanar CT image reconstructions and MIPs were
obtained to evaluate the vascular anatomy.
CONTRAST:  90mL OMNIPAQUE IOHEXOL 350 MG/ML SOLN

[Series 5: thins · axial · 0.74mm/px · z∈[+1235,+1453]mm · 15 of 240 slices shown]
[im 11/240  lung]
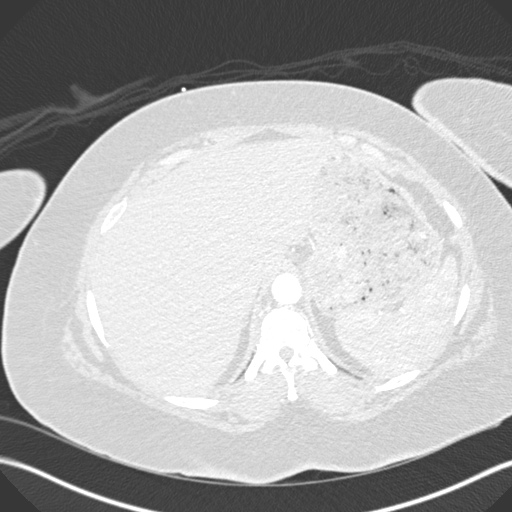
[im 33/240  soft-tissue]
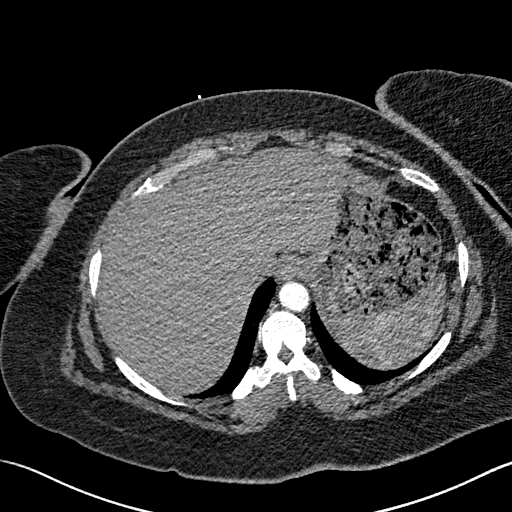
[im 44/240  lung]
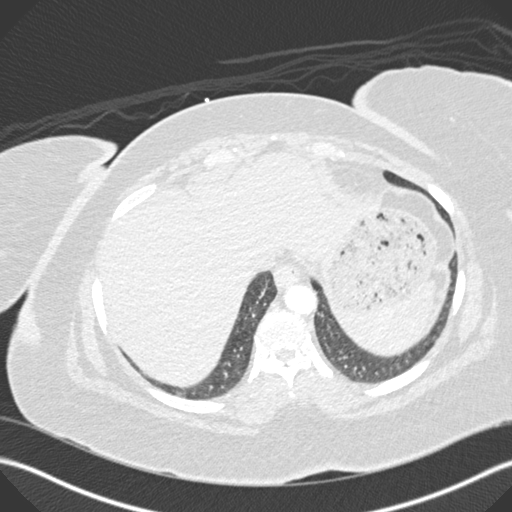
[im 55/240  soft-tissue]
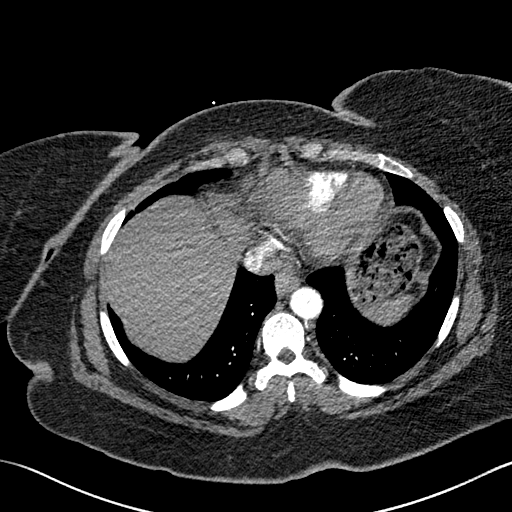
[im 77/240  lung]
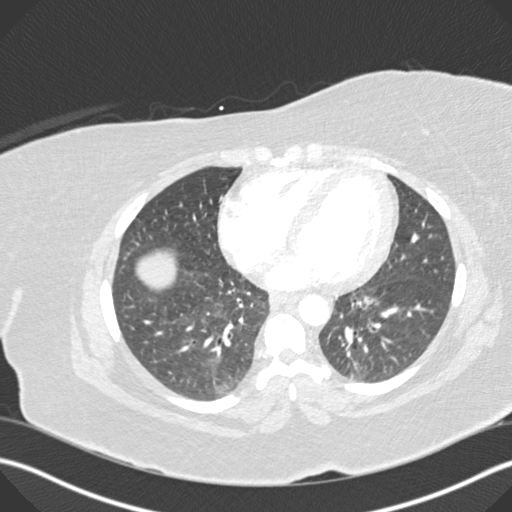
[im 87/240  soft-tissue]
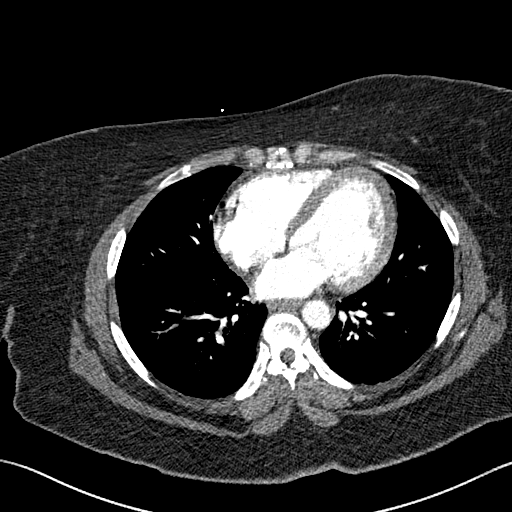
[im 109/240  lung]
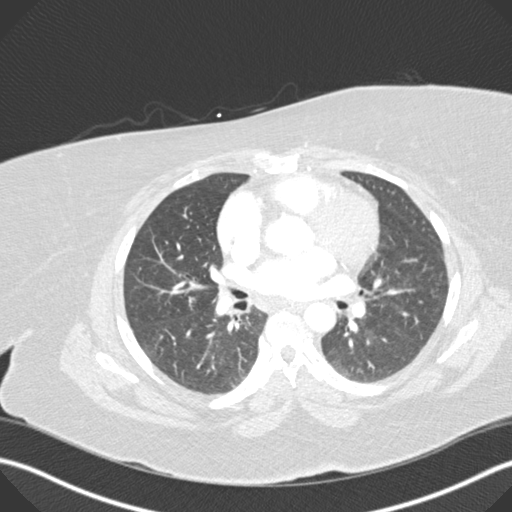
[im 120/240  soft-tissue]
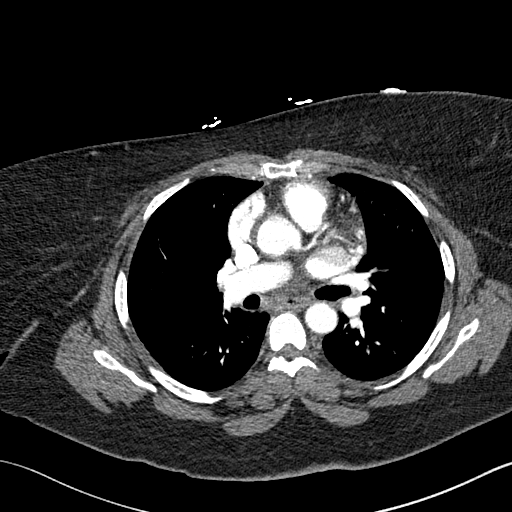
[im 131/240  lung]
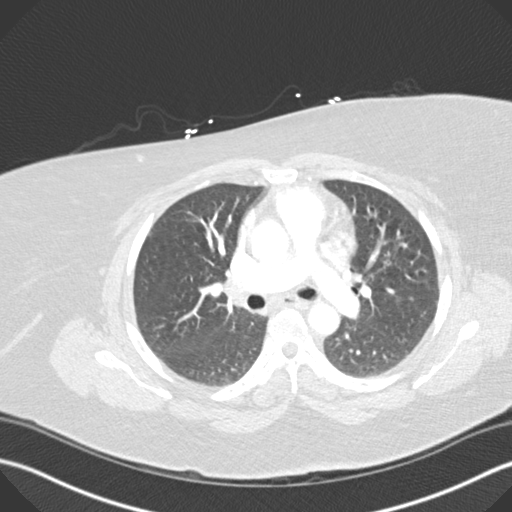
[im 153/240  soft-tissue]
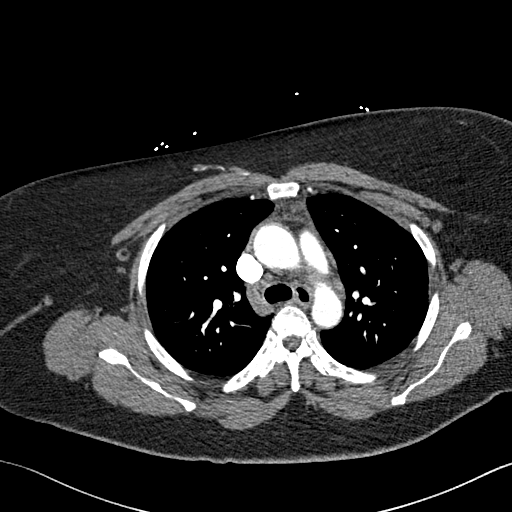
[im 163/240  lung]
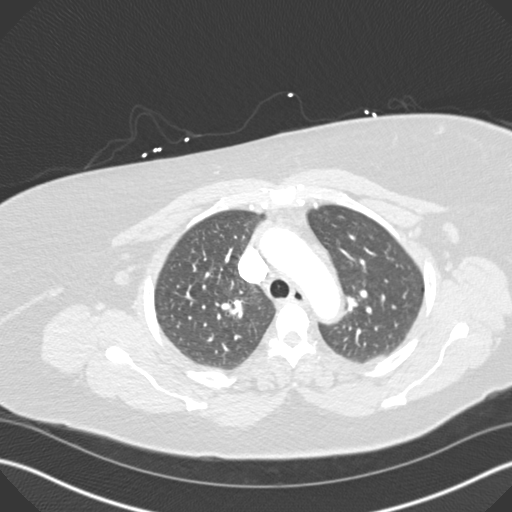
[im 185/240  soft-tissue]
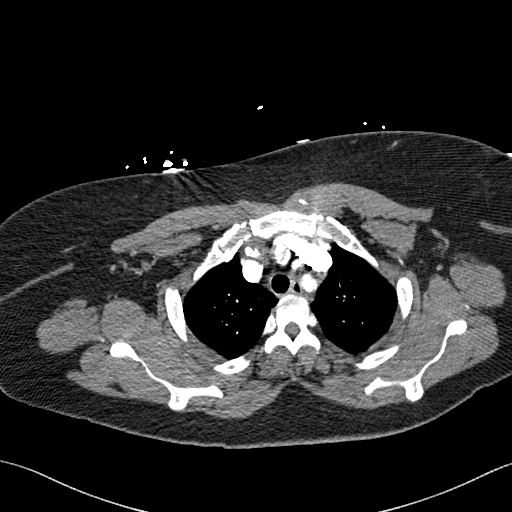
[im 196/240  lung]
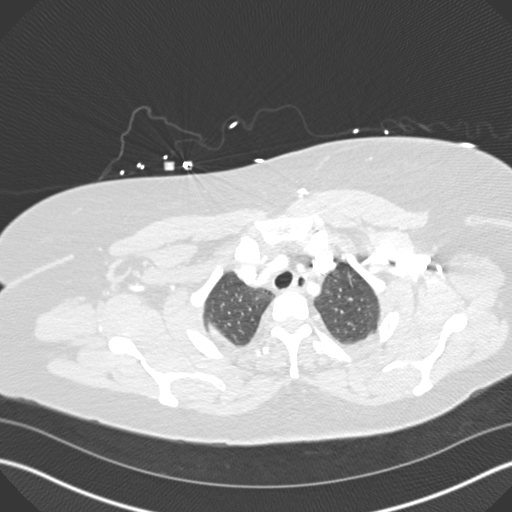
[im 207/240  soft-tissue]
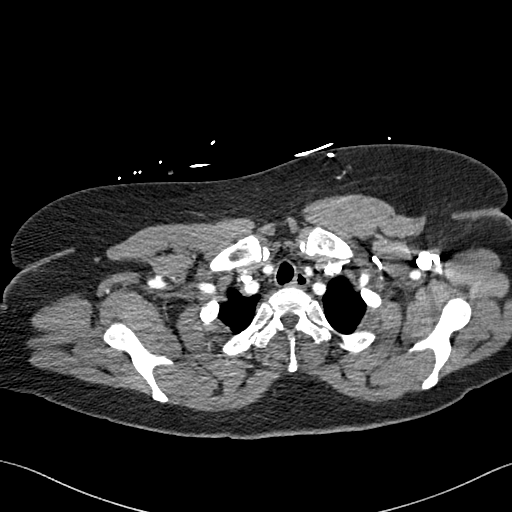
[im 229/240  lung]
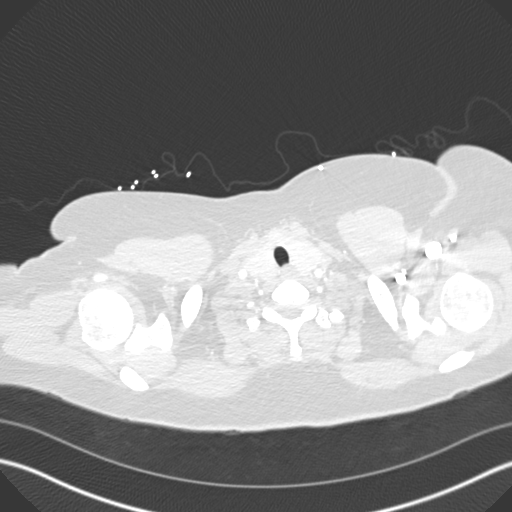

[Series 7: coronal mpr · coronal · 0.46mm/px · 3 of 135 slices shown]
[im 34/135  soft-tissue]
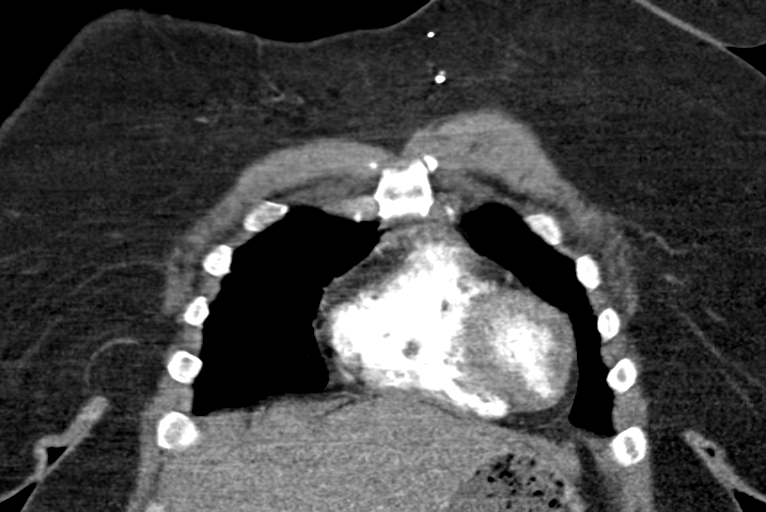
[im 68/135  soft-tissue]
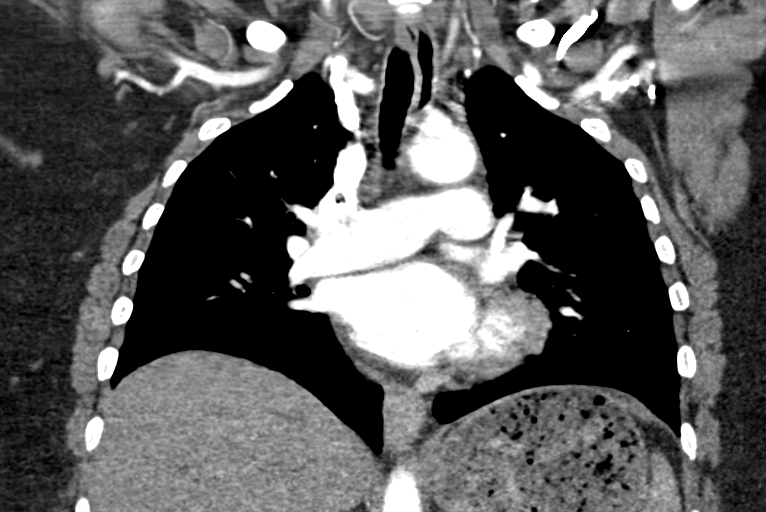
[im 101/135  soft-tissue]
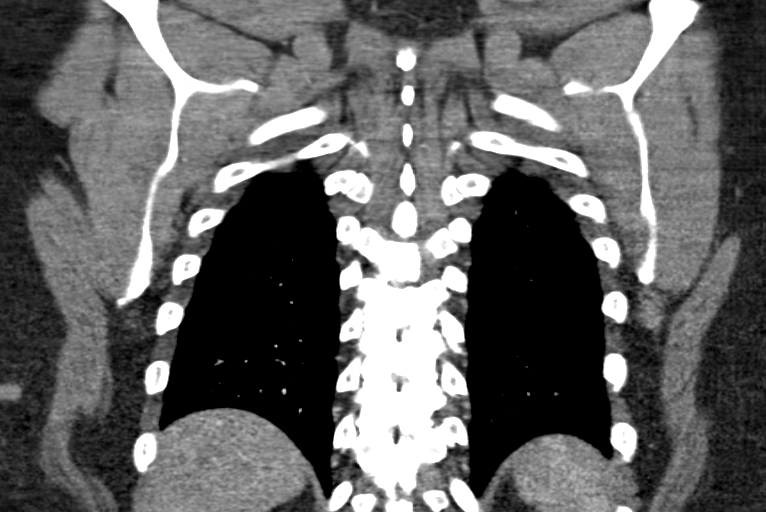

[18 of 46 positions shown; findings below may reference images not displayed]

FINDINGS: Mediastinum: Normal heart size. No pericardial effusion. The trachea
appears patent and is midline. Normal appearance of the esophagus.
No axillary or supraclavicular adenopathy. The main pulmonary artery
is patent. There is no saddle embolus. No lobar or segmental
pulmonary artery filling defects identified.

Lungs/Pleura: No pleural fluid noted. No airspace consolidation or
atelectasis.

Upper Abdomen: No suspicious liver abnormality. The visualized
portions of the spleen and adrenal glands are unremarkable.

Musculoskeletal: The visualized osseous structures are unremarkable.

Review of the MIP images confirms the above findings.
IMPRESSION: 1. No evidence for acute pulmonary embolus.

## 2017-03-28 ENCOUNTER — Encounter: Payer: Self-pay | Admitting: Internal Medicine

## 2017-03-28 ENCOUNTER — Other Ambulatory Visit: Payer: Self-pay

## 2017-03-28 ENCOUNTER — Ambulatory Visit (INDEPENDENT_AMBULATORY_CARE_PROVIDER_SITE_OTHER): Payer: Medicare Other | Admitting: Internal Medicine

## 2017-03-28 VITALS — Wt 207.0 lb

## 2017-03-28 DIAGNOSIS — E118 Type 2 diabetes mellitus with unspecified complications: Secondary | ICD-10-CM

## 2017-03-28 DIAGNOSIS — Z0000 Encounter for general adult medical examination without abnormal findings: Secondary | ICD-10-CM

## 2017-03-28 NOTE — Progress Notes (Signed)
Patient did not want to see provider.  Patient wanted to see nurse for annual wellness visit.

## 2017-04-14 ENCOUNTER — Other Ambulatory Visit: Payer: Self-pay | Admitting: Obstetrics and Gynecology

## 2017-04-14 NOTE — Telephone Encounter (Signed)
Please ask patient to make a follow up visit with the provider.

## 2017-04-19 NOTE — Telephone Encounter (Signed)
Patient informed and appt scheduled for 05-03-17. Jazmin Hartsell,CMA

## 2017-05-02 NOTE — Progress Notes (Signed)
   [facility] [phone]   Date of Visit: 05/03/2017   HPI:  This is my first time meeting Ms. [name] as her new PCP  Head cold: - For the past 3 days she has been having some rhinorrhea, nasal congestion, dry cough.  She then reports she does produce some mucus with coughing -For the past 2 nights her cough has been worse at night and her throat has been itchy and dry -This morning she had a sore throat -No watery or itchy eyes -No fevers, no shortness of breath, no sick contacts  Type 2 diabetes: -She has not been seen in clinic for her diabetes in quite a while.  Her last A1c was obtained in September 2018 which was 10.6.  She has not followed up in clinic until today. -Medications: Metformin 1000mg  twice daily -Patient reports she has not been compliant with her metformin.  She has had other issues going on in her life that prevented her from taking her medications regularly.  Patient has been very irregularly taking medications for the past 6 months. -She reports that her CBGs are between 145-165 2 hours after eating -She reports she does walk 1-1.5 miles 3-4 times a week -Denies any polyuria, polydipsia -She has had some dietary indiscretions  ROS: See HPI.  PMFSH:  PMH: HTN DM2 Peripheral Neuropathy Piriformis Syndrome Low Back pain with right sided sciatica Lupus with lupus arthritis Obesity Tobacco Use  PHYSICAL EXAM: BP (!) 146/80   Pulse 87   Temp 98.6 F (37 C) (Oral)   Ht 5\' 3"  (1.6 m)   Wt 210 lb (95.3 kg)   SpO2 99%   BMI 37.20 kg/m  GEN: NAD HEENT: Atraumatic, normocephalic, neck supple, EOMI, sclera clear  CV: RRR, no murmurs, rubs, or gallops PULM: CTAB, normal effort ABD: Soft, nontender, nondistended, NABS, no organomegaly SKIN: No rash or cyanosis; warm and well-perfused EXTR: No lower extremity edema or calf tenderness PSYCH: Mood and affect euthymic, normal rate and volume of speech NEURO: Awake, alert, no  focal deficits grossly, normal speech   ASSESSMENT/PLAN:  Health maintenance:  -Declines flu vaccine, pneumonia vaccine, shingles vaccine today  Allergic rhinitis: Her symptoms seem to be most consistent with an allergic etiology.  versus a viral URI with cough.  No signs or symptoms of pneumonia.  Trial of Flonase.  Return precautions.  Type 2 diabetes mellitus, uncontrolled (HCC) Her A1c today is 8.3 from 10.6 in September 2018.  It seems like she has been increasing her exercise.  She has not been taking metformin as prescribed regularly.  Discussed options for therapy including adding another medication.  Decided to encourage metformin compliance first along with diet and lifestyle modifications.  Patient to follow-up in 2-3 weeks with blood sugars   Palma HolterKanishka G Weeks Rando, MD PGY 3 [facility]

## 2017-05-03 ENCOUNTER — Encounter: Payer: Self-pay | Admitting: Internal Medicine

## 2017-05-03 ENCOUNTER — Other Ambulatory Visit: Payer: Self-pay

## 2017-05-03 ENCOUNTER — Ambulatory Visit (INDEPENDENT_AMBULATORY_CARE_PROVIDER_SITE_OTHER): Payer: Medicare Other | Admitting: Internal Medicine

## 2017-05-03 VITALS — BP 146/80 | HR 87 | Temp 98.6°F | Ht 63.0 in | Wt 210.0 lb

## 2017-05-03 DIAGNOSIS — E785 Hyperlipidemia, unspecified: Secondary | ICD-10-CM

## 2017-05-03 DIAGNOSIS — J309 Allergic rhinitis, unspecified: Secondary | ICD-10-CM

## 2017-05-03 DIAGNOSIS — E1165 Type 2 diabetes mellitus with hyperglycemia: Secondary | ICD-10-CM | POA: Diagnosis present

## 2017-05-03 DIAGNOSIS — M79604 Pain in right leg: Secondary | ICD-10-CM

## 2017-05-03 LAB — POCT GLYCOSYLATED HEMOGLOBIN (HGB A1C): Hemoglobin A1C: 8.3

## 2017-05-03 MED ORDER — FLUTICASONE PROPIONATE 50 MCG/ACT NA SUSP: 2.0000 | Freq: Every day | NASAL | 0 refills | Status: DC

## 2017-05-03 MED ORDER — PREGABALIN 150 MG PO CAPS: 150.0000 mg | ORAL_CAPSULE | Freq: Every day | ORAL | 0 refills | Status: DC

## 2017-05-03 NOTE — Assessment & Plan Note (Signed)
Her A1c today is 8.3 from 10.6 in September 2018.  It seems like she has been increasing her exercise.  She has not been taking metformin as prescribed regularly.  Discussed options for therapy including adding another medication.  Decided to encourage metformin compliance first along with diet and lifestyle modifications.  Patient to follow-up in 2-3 weeks with blood sugars

## 2017-05-03 NOTE — Patient Instructions (Addendum)
Lets try Flonase for your symptoms.   Please return to clinic if you have fevers, shortness of breath   Please start to take Metformin.   Check your sugar daily in the morning and keep a log.   Follow up in 2-3 weeks    Diet Recommendations for Diabetes   Starchy (carb) foods include: Bread, rice, pasta, potatoes, corn, crackers, bagels, muffins, all baked goods.  (Fruits, milk, and yogurt also have carbohydrate, but most of these foods will not spike your blood sugar as the starchy foods will.)  A few fruits do cause high blood sugars; use small portions of bananas (limit to 1/2 at a time), grapes, and most tropical fruits.    Protein foods include: Meat, fish, poultry, eggs, dairy foods, and beans such as pinto and kidney beans (beans also provide carbohydrate).   1. Eat at least 3 meals and 1-2 snacks per day. Never go more than 4-5 hours while awake without eating.  2. Limit starchy foods to TWO per meal and ONE per snack. ONE portion of a starchy  food is equal to the following:   - ONE slice of bread (or its equivalent, such as half of a hamburger bun).   - 1/2 cup of a "scoopable" starchy food such as potatoes or rice.   - 15 grams of carbohydrate as shown on food label.  3. Both lunch and dinner should include a protein food, a carb food, and vegetables.   - Obtain twice as many veg's as protein or carbohydrate foods for both lunch and dinner.   - Fresh or frozen veg's are best.   - Try to keep frozen veg's on hand for a quick vegetable serving.    4. Breakfast should always include protein.

## 2017-05-17 ENCOUNTER — Emergency Department (HOSPITAL_COMMUNITY): Payer: Medicare Other

## 2017-05-17 ENCOUNTER — Emergency Department (HOSPITAL_COMMUNITY)
Admission: EM | Admit: 2017-05-17 | Discharge: 2017-05-17 | Disposition: A | Discharge status: Home / Self Care | Payer: Medicare Other | Attending: Emergency Medicine | Admitting: Emergency Medicine

## 2017-05-17 ENCOUNTER — Encounter (HOSPITAL_COMMUNITY): Payer: Self-pay | Admitting: Emergency Medicine

## 2017-05-17 DIAGNOSIS — S4991XA Unspecified injury of right shoulder and upper arm, initial encounter: Secondary | ICD-10-CM | POA: Diagnosis not present

## 2017-05-17 DIAGNOSIS — I10 Essential (primary) hypertension: Secondary | ICD-10-CM | POA: Diagnosis not present

## 2017-05-17 DIAGNOSIS — W270XXA Contact with workbench tool, initial encounter: Secondary | ICD-10-CM | POA: Insufficient documentation

## 2017-05-17 DIAGNOSIS — E119 Type 2 diabetes mellitus without complications: Secondary | ICD-10-CM | POA: Insufficient documentation

## 2017-05-17 DIAGNOSIS — Z794 Long term (current) use of insulin: Secondary | ICD-10-CM | POA: Diagnosis not present

## 2017-05-17 DIAGNOSIS — M25521 Pain in right elbow: Secondary | ICD-10-CM

## 2017-05-17 DIAGNOSIS — F17210 Nicotine dependence, cigarettes, uncomplicated: Secondary | ICD-10-CM | POA: Diagnosis not present

## 2017-05-17 DIAGNOSIS — Z880 Allergy status to penicillin: Secondary | ICD-10-CM | POA: Diagnosis not present

## 2017-05-17 DIAGNOSIS — Z79899 Other long term (current) drug therapy: Secondary | ICD-10-CM | POA: Diagnosis not present

## 2017-05-17 DIAGNOSIS — S59901A Unspecified injury of right elbow, initial encounter: Secondary | ICD-10-CM | POA: Diagnosis not present

## 2017-05-17 MED ORDER — TRAMADOL HCL 50 MG PO TABS
50.0000 mg | ORAL_TABLET | Freq: Once | ORAL | Status: AC
  Administered 2017-05-17: 50 mg via ORAL
  Filled 2017-05-17 (×2): qty 1

## 2017-05-17 MED ORDER — ONDANSETRON 4 MG PO TBDP
4.0000 mg | ORAL_TABLET | Freq: Once | ORAL | Status: DC
  Filled 2017-05-17: qty 1

## 2017-05-17 NOTE — Discharge Instructions (Signed)
Please read and follow all provided instructions.  You have been seen today for Right elbow pain  Tests performed today include: An x-ray of the affected area - does NOT show any broken bones or dislocations.  Vital signs. See below for your results today.   Home care instructions: -- *PRICE in the first 24-48 hours after injury: Protect (with brace, splint, sling), if given by your provider -please take your arm out of your shoulder sling at least once per day and perform shoulder range of motion exercises in order to prevent frozen shoulder. Rest Ice- Do not apply ice pack directly to your skin, place towel or similar between your skin and ice/ice pack. Apply ice for 20 min, then remove for 40 min while awake Compression- Wear brace, elastic bandage, splint as directed by your provider Elevate affected extremity above the level of your heart when not walking around for the first 24-48 hours   Use Ibuprofen (Motrin/Advil) 600mg  every 6 hours as needed for pain (do not exceed max dose in 24 hours, 2400mg )  Follow-up instructions: Please follow-up with your primary care provider or the provided orthopedic physician (bone specialist) if you continue to have significant pain in 1 week. In this case you may have a more severe injury that requires further care.   Return instructions:  Please return if your toes or feet are numb or tingling, appear gray or blue, or you have severe pain (also elevate the leg and loosen splint or wrap if you were given one) Please return to the Emergency Department if you experience worsening symptoms.  Please return if you have any other emergent concerns. Additional Information:  Your vital signs today were: BP (!) 151/87    Pulse 86    Temp 98.6 F (37 C) (Oral)    Resp 16    SpO2 98%  If your blood pressure (BP) was elevated above 135/85 this visit, please have this repeated by your doctor within one month. ---------------
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
## 2017-05-17 NOTE — ED Notes (Signed)
Patient transported to X-ray 
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
## 2017-05-19 ENCOUNTER — Encounter (HOSPITAL_COMMUNITY): Payer: Self-pay

## 2017-05-19 ENCOUNTER — Emergency Department (HOSPITAL_COMMUNITY): Payer: Medicare Other

## 2017-05-19 ENCOUNTER — Other Ambulatory Visit: Payer: Self-pay

## 2017-05-19 ENCOUNTER — Emergency Department (HOSPITAL_COMMUNITY)
Admission: EM | Admit: 2017-05-19 | Discharge: 2017-05-19 | Disposition: A | Discharge status: Home / Self Care | Payer: Medicare Other | Attending: Emergency Medicine | Admitting: Emergency Medicine

## 2017-05-19 DIAGNOSIS — Y9389 Activity, other specified: Secondary | ICD-10-CM | POA: Diagnosis not present

## 2017-05-19 DIAGNOSIS — Z7984 Long term (current) use of oral hypoglycemic drugs: Secondary | ICD-10-CM | POA: Insufficient documentation

## 2017-05-19 DIAGNOSIS — E1140 Type 2 diabetes mellitus with diabetic neuropathy, unspecified: Secondary | ICD-10-CM | POA: Insufficient documentation

## 2017-05-19 DIAGNOSIS — S63610A Unspecified sprain of right index finger, initial encounter: Secondary | ICD-10-CM | POA: Insufficient documentation

## 2017-05-19 DIAGNOSIS — Y9289 Other specified places as the place of occurrence of the external cause: Secondary | ICD-10-CM | POA: Insufficient documentation

## 2017-05-19 DIAGNOSIS — Y999 Unspecified external cause status: Secondary | ICD-10-CM | POA: Diagnosis not present

## 2017-05-19 DIAGNOSIS — F17210 Nicotine dependence, cigarettes, uncomplicated: Secondary | ICD-10-CM | POA: Insufficient documentation

## 2017-05-19 DIAGNOSIS — S6991XA Unspecified injury of right wrist, hand and finger(s), initial encounter: Secondary | ICD-10-CM | POA: Diagnosis present

## 2017-05-19 DIAGNOSIS — Z79899 Other long term (current) drug therapy: Secondary | ICD-10-CM | POA: Insufficient documentation

## 2017-05-19 DIAGNOSIS — M79641 Pain in right hand: Secondary | ICD-10-CM | POA: Diagnosis not present

## 2017-05-19 MED ORDER — TRAMADOL HCL 50 MG PO TABS: 50.0000 mg | ORAL_TABLET | Freq: Four times a day (QID) | ORAL | 0 refills | Status: DC | PRN

## 2017-05-19 NOTE — ED Notes (Signed)
Patient verbalizes understanding of discharge instructions. Opportunity for questioning and answers were provided. Armband removed by staff, pt discharged from ED ambulatory.   

## 2017-05-19 NOTE — ED Notes (Signed)
ED Provider at bedside. 

## 2017-05-19 NOTE — Discharge Instructions (Signed)
Rest, Ice intermittently (in the first 24-48 hours), Gentle compression with an Ace wrap, and elevate (Limb above the level of the heart)   Take up to 800mg  of ibuprofen (that is usually 4 over the counter pills)  3 times a day for 5 days. Take with food.  For breakthrough pain you may take Tramadol. Do not drink alcohol drive or operate heavy machinery when taking Tramadol.  Do not hesitate to return to the emergency room for any new, worsening or concerning symptoms.  Please obtain primary care using resource guide below. Let them know that you were seen in the emergency room and that they will need to obtain records for further outpatient management.
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
## 2017-05-19 NOTE — ED Notes (Signed)
Swelling noted to R index finger, tender to palpation.
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
## 2017-06-02 ENCOUNTER — Encounter: Payer: Self-pay | Admitting: Family Medicine

## 2017-06-02 ENCOUNTER — Ambulatory Visit (INDEPENDENT_AMBULATORY_CARE_PROVIDER_SITE_OTHER): Payer: Medicare Other | Admitting: Family Medicine

## 2017-06-02 ENCOUNTER — Other Ambulatory Visit: Payer: Self-pay

## 2017-06-02 VITALS — BP 122/80 | HR 117 | Temp 99.2°F | Ht 63.0 in | Wt 199.0 lb

## 2017-06-02 DIAGNOSIS — B9789 Other viral agents as the cause of diseases classified elsewhere: Secondary | ICD-10-CM | POA: Diagnosis not present

## 2017-06-02 DIAGNOSIS — E1165 Type 2 diabetes mellitus with hyperglycemia: Secondary | ICD-10-CM

## 2017-06-02 DIAGNOSIS — J988 Other specified respiratory disorders: Secondary | ICD-10-CM | POA: Diagnosis not present

## 2017-06-02 LAB — GLUCOSE, POCT (MANUAL RESULT ENTRY): POC Glucose: 286 mg/dl — AB (ref 70–99)

## 2017-06-02 MED ORDER — METFORMIN HCL 500 MG PO TABS: 500.0000 mg | ORAL_TABLET | Freq: Two times a day (BID) | ORAL | 0 refills | Status: DC

## 2017-06-02 NOTE — Assessment & Plan Note (Signed)
Patient has not been taking metformin for last two weeks.   She was prescribed 1000bid but says she was not tolerating it and insists on lower dose.  As Dr. Ottie GlazierGunadasa was planning to have discussion about additional medication, I will prescribe 500bid, encourage medication consistency and arrange for prompt followup specifically to discuss additional meds with Dr. Ottie GlazierGunadasa

## 2017-06-02 NOTE — Assessment & Plan Note (Addendum)
2-3 days time, non-productive, mild subjective fever symptoms a few times per day self treating with tylenol.   Lungs clear with no wheezing.  No LAD, no throat pain, no swallowing difficulty. No ear/eye symptoms  Mild viral respiratory infection.  No antibiotics indicated
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
## 2017-06-07 ENCOUNTER — Encounter: Payer: Self-pay | Admitting: Family Medicine

## 2017-06-07 ENCOUNTER — Ambulatory Visit (INDEPENDENT_AMBULATORY_CARE_PROVIDER_SITE_OTHER): Payer: Medicare Other | Admitting: Family Medicine

## 2017-06-07 ENCOUNTER — Ambulatory Visit
Admission: RE | Admit: 2017-06-07 | Discharge: 2017-06-07 | Disposition: A | Discharge status: Home / Self Care | Payer: Medicare Other | Source: Ambulatory Visit | Attending: Family Medicine | Admitting: Family Medicine

## 2017-06-07 ENCOUNTER — Other Ambulatory Visit: Payer: Self-pay

## 2017-06-07 VITALS — BP 120/70 | HR 71 | Temp 98.3°F | Wt 200.0 lb

## 2017-06-07 DIAGNOSIS — J988 Other specified respiratory disorders: Secondary | ICD-10-CM

## 2017-06-07 DIAGNOSIS — J989 Respiratory disorder, unspecified: Secondary | ICD-10-CM

## 2017-06-07 DIAGNOSIS — B9789 Other viral agents as the cause of diseases classified elsewhere: Secondary | ICD-10-CM | POA: Diagnosis not present

## 2017-06-07 DIAGNOSIS — R05 Cough: Secondary | ICD-10-CM | POA: Diagnosis not present

## 2017-06-07 MED ORDER — DOXYCYCLINE HYCLATE 100 MG PO TABS: 100.0000 mg | ORAL_TABLET | Freq: Two times a day (BID) | ORAL | 0 refills | Status: DC

## 2017-06-07 NOTE — Patient Instructions (Addendum)
It was a pleasure to see you today! Thank you for choosing [facility] for your primary care. [name] was seen for respiratory complaints. Come back to the clinic if your symptoms get worse, and go to the emergency room if you have life threatening symptoms.   We are [facility] on [address] and doxycyline 100mg  twice per day for 7 days.  If we did any lab work today, and the results require attention, either me or my nurse will get in touch with you. If everything is normal, you will get a letter in mail and a message via . If you don't hear from us in two weeks, please give us a call. Otherwise, we look forward to seeing you again at your next visit. If you have any questions or concerns before then, please call the clinic at [phone].  Please bring all your medications to every doctors visit  Sign up for My Chart to have easy access to your labs results, and communication with your Primary care physician.    Please check-out at the front desk before leaving the clinic.    Best,  Dr. Marthenia RollingScott Alga Weeks FAMILY MEDICINE RESIDENT - PGY1 06/07/2017 3:11 PM

## 2017-06-07 NOTE — Progress Notes (Signed)
    Subjective:  [name] is a 52 y.o. female who [facility] with a chief complaint of cough and fatigue.   HPI: Patient was seen prior on 2/8 and diagnoises with viral respiratory illness based on exam and presentation.   She presents today after no resolution to symptoms and a worsening of fatigue in the last 2 days.   She states she has a dry cough and no energy to complete her normal activity level.   She has had subjective fever symptoms (chills/sweats)  No diarrhea, no unrinary changes, no discharge/bleeding.   She does have decreased appetite but no emesis.  Objective:  Physical Exam: BP 120/70   Pulse 71   Temp 98.3 F (36.8 C) (Oral)   Wt 200 lb (90.7 kg)   SpO2 97%   BMI 35.43 kg/m   Gen: fatigued but non-toxic appearing. CV: RRR with no murmurs appreciated Pulm: NWOB, crackles diffusely but no wheezing, no cyanosis GI: Normal bowel sounds present. Soft, Nontender, Nondistended. MSK: no edema, cyanosis, or clubbing noted Skin: warm, dry Neuro: grossly normal, moves all extremities Psych: Normal affect and thought content  No results found for this or any previous visit (from the past 72 hour(s)).   Assessment/Plan:  No problem-specific Assessment & Plan notes found for this encounter.   Marthenia RollingScott Thinh Cuccaro, DO FAMILY MEDICINE RESIDENT - PGY1 06/09/2017 10:16 AM

## 2017-06-09 DIAGNOSIS — J989 Respiratory disorder, unspecified: Secondary | ICD-10-CM | POA: Insufficient documentation

## 2017-06-09 NOTE — Assessment & Plan Note (Signed)
Suspect bacterial pnuemonia overriding viral infection.   Will start doxy and sent for CXR (resulted as no acute process)

## 2018-02-20 ENCOUNTER — Ambulatory Visit (INDEPENDENT_AMBULATORY_CARE_PROVIDER_SITE_OTHER): Payer: Medicare Other | Admitting: Family Medicine

## 2018-02-20 ENCOUNTER — Other Ambulatory Visit: Payer: Self-pay

## 2018-02-20 VITALS — BP 128/72 | HR 84 | Temp 98.2°F | Wt 200.0 lb

## 2018-02-20 DIAGNOSIS — B373 Candidiasis of vulva and vagina: Secondary | ICD-10-CM

## 2018-02-20 DIAGNOSIS — A599 Trichomoniasis, unspecified: Secondary | ICD-10-CM

## 2018-02-20 DIAGNOSIS — B3731 Acute candidiasis of vulva and vagina: Secondary | ICD-10-CM

## 2018-02-20 DIAGNOSIS — E1165 Type 2 diabetes mellitus with hyperglycemia: Secondary | ICD-10-CM | POA: Diagnosis not present

## 2018-02-20 HISTORY — DX: Trichomoniasis, unspecified: A59.9

## 2018-02-20 LAB — POCT WET PREP (WET MOUNT): CLUE CELLS WET PREP WHIFF POC: NEGATIVE

## 2018-02-20 LAB — POCT GLYCOSYLATED HEMOGLOBIN (HGB A1C): HbA1c, POC (controlled diabetic range): 11.3 % — AB (ref 0.0–7.0)

## 2018-02-20 MED ORDER — METFORMIN HCL ER 750 MG PO TB24: 750.0000 mg | ORAL_TABLET | Freq: Every day | ORAL | 0 refills | Status: DC

## 2018-02-20 MED ORDER — FLUCONAZOLE 150 MG PO TABS: 150.0000 mg | ORAL_TABLET | Freq: Once | ORAL | 0 refills | Status: AC

## 2018-02-20 MED ORDER — METRONIDAZOLE 500 MG PO TABS: 500.0000 mg | ORAL_TABLET | Freq: Two times a day (BID) | ORAL | 0 refills | Status: AC

## 2018-02-20 MED ORDER — HYDROXYCHLOROQUINE SULFATE 200 MG PO TABS: 200.0000 mg | ORAL_TABLET | Freq: Two times a day (BID) | ORAL | 0 refills | Status: DC

## 2018-02-20 NOTE — Assessment & Plan Note (Signed)
Wet prep was positive for yeast infection and trichomoniasis. Discuss with patient results and inform her her partner will also need to be treated. Yeast infection likely secondary to uncontrolled diabetes. --Diflucan 150 mg once --Flagyl 500 mg bid for 7 days --Improve glycemic control

## 2018-02-20 NOTE — Assessment & Plan Note (Signed)
A1c 11.3 today likely secondary to medication non adherence. Will switch patient from regular metformin to XR 750 mg once a day. She will follow up with PCP. Emphasize need to follow up in clinic in order to improve glycemic control.

## 2018-02-20 NOTE — Patient Instructions (Signed)

## 2018-02-20 NOTE — Progress Notes (Signed)
   Subjective:    Patient ID: [name], female    DOB: December 22, 1965, 52 y.o.   MRN: [id number]   CC: Vaginal irritation   HPI: Patient is a 52 yo female who present today complaining of vaginal irritation. Patient reports she has had vaginal hitching for the past 4 days and discharge. Patient reports she has not be taking her metformin for her diabetes and know this has been the reason. Patient used to get frequent yeast infection before she was started on oral agent. Patient reports that she has not taken her metformin in the past few months due to diarrhea. She was on 500 mg bid. She denies any other symptoms. She is sexual active. She denies any nausea, vomiting, abdominal pain, dysuria, fever or chills.    Smoking status reviewed   ROS: all other systems were reviewed and are negative other than in the HPI   Past Medical History:  Diagnosis Date  . Arthritis   . Chronic back pain    DDD  . Diabetes mellitus without complication (HCC)    takes Metformin daily  . Insomnia    takes Melatonin nightly  . Joint pain   . Lupus (HCC)    takes Plaquenil daily  . Substance abuse [facility])     Past Surgical History:  Procedure Laterality Date  . APPENDECTOMY    . SPINE SURGERY  03/10/2016   Spinal Fusion L5-S1  . TONSILLECTOMY      Past medical history, surgical, family, and social history reviewed and updated in the EMR as appropriate.  Objective:  BP 128/72   Pulse 84   Temp 98.2 F (36.8 C) (Oral)   Wt 200 lb (90.7 kg)   SpO2 98%   BMI 35.43 kg/m   Vitals and nursing note reviewed  General: NAD, pleasant, able to participate in exam Cardiac: RRR, normal heart sounds, no murmurs. 2+ radial and PT pulses bilaterally Respiratory: CTAB, normal effort, No wheezes, rales or rhonchi Abdomen: soft, nontender, nondistended, no hepatic or splenomegaly, +BS GU: self swab Extremities: no edema or cyanosis. WWP. Skin: warm and dry, no rashes noted Neuro: alert and oriented x4,  no focal deficits Psych: Normal affect and mood   Assessment & Plan:   Yeast vaginitis Wet prep was positive for yeast infection and trichomoniasis. Discuss with patient results and inform her her partner will also need to be treated. Yeast infection likely secondary to uncontrolled diabetes. --Diflucan 150 mg once --Flagyl 500 mg bid for 7 days --Improve glycemic control  Type 2 diabetes mellitus, uncontrolled (HCC) A1c 11.3 today likely secondary to medication non adherence. Will switch patient from regular metformin to XR 750 mg once a day. She will follow up with PCP. Emphasize need to follow up in clinic in order to improve glycemic control.    Lovena Neighbours, MD [facility] PGY-3

## 2018-03-16 ENCOUNTER — Other Ambulatory Visit: Payer: Self-pay | Admitting: Family Medicine

## 2018-03-16 DIAGNOSIS — E1165 Type 2 diabetes mellitus with hyperglycemia: Secondary | ICD-10-CM

## 2018-03-19 ENCOUNTER — Other Ambulatory Visit: Payer: Self-pay | Admitting: Family Medicine

## 2018-04-21 ENCOUNTER — Other Ambulatory Visit: Payer: Self-pay | Admitting: Family Medicine

## 2018-04-21 DIAGNOSIS — E1165 Type 2 diabetes mellitus with hyperglycemia: Secondary | ICD-10-CM

## 2018-08-07 ENCOUNTER — Other Ambulatory Visit: Payer: Self-pay

## 2018-08-07 MED ORDER — GLUCOSE BLOOD VI STRP: ORAL_STRIP | 2 refills | Status: DC

## 2018-08-08 ENCOUNTER — Other Ambulatory Visit: Payer: Self-pay

## 2018-08-08 MED ORDER — ONETOUCH DELICA LANCETS 30G MISC: 1.0000 | Freq: Once | 1 refills | Status: AC

## 2018-08-09 ENCOUNTER — Telehealth: Payer: Self-pay | Admitting: Family Medicine

## 2018-08-09 NOTE — Telephone Encounter (Signed)
Pt is calling about a refill for medication for yeast infection caused by her blood sugar being up due to not having her meds. Please give pt a call.

## 2018-08-14 ENCOUNTER — Telehealth (INDEPENDENT_AMBULATORY_CARE_PROVIDER_SITE_OTHER): Payer: Medicare Other | Admitting: Family Medicine

## 2018-08-14 DIAGNOSIS — B373 Candidiasis of vulva and vagina: Secondary | ICD-10-CM

## 2018-08-14 DIAGNOSIS — B3731 Acute candidiasis of vulva and vagina: Secondary | ICD-10-CM

## 2018-08-14 MED ORDER — FLUCONAZOLE 150 MG PO TABS: 150.0000 mg | ORAL_TABLET | ORAL | 0 refills | Status: AC

## 2018-08-14 NOTE — Telephone Encounter (Signed)
 [facility] Telemedicine Visit  Patient consented to have virtual visit. Method of visit: Telephone  Encounter participants: Patient: [name] - located at home alone  Provider: Westley Chandler - located at [facility]   Chief Complaint: yeast infection   HPI:  [name] is a pleasant 53 y.o. woman with history of type 2 diabetes (poorly controlled), SLE, and chronic pain presenting via audio only visit for yeast infection concerns.   The patient reports that for several months she was out of her diabetic medications.  She recently restarted her metformin once a day last week.  She is try to tolerate this as it has caused GI upset in the past.  She is on the extended release version which is really helped her symptoms.  She reports a one-week history of thick white vaginal discharge along with some pruritus in the vaginal area.  She specifically denies dysuria, back pain, fevers, pelvic pain.  She is sexually  active with one partner.  Of note importantly she has a history of trichomonas.  She reports she completed treatment of this and her partner also completed treatment she does not use condoms regularly.  ROS: per HPI  Pertinent PMHx:  Trichomonas Type II DM- A1C 11 at last check (November)  SLE   Exam:  Respiratory: Speaking in full sentences, appropriate, talkative   Assessment/Plan:  Diagnoses and all orders for this visit:  Yeast vaginitis -     fluconazole (DIFLUCAN) 150 MG tablet; Take 1 tablet (150 mg total) by mouth every 3 (three) days for 2 doses.  Given the patient's history of trichomoniasis I recommended an exam in the office.  She declines this.  I also recommend HIV hepatitis C and syphilis screening in the future given her history of sexually transmitted sections.  She should also have testing for gonorrhea and chlamydia given her positive Trichomonas in the past.  She is amenable to this in June or July after the pandemic ends.  This  encounter took place during the corona virus pandemic of 2020. Clinical practice guidelines, policies, recommendations, and protocols changed daily during this time. The patient was cared for in alignment with the current policies of the day.   Time spent during visit with patient: 8 minutes

## 2018-08-15 NOTE — Telephone Encounter (Signed)
Patient spoke with Dr. Manson Passey over the phone and is now being treated and has no further concerns at this time.

## 2018-09-12 ENCOUNTER — Other Ambulatory Visit: Payer: Self-pay

## 2018-09-12 ENCOUNTER — Ambulatory Visit (INDEPENDENT_AMBULATORY_CARE_PROVIDER_SITE_OTHER): Payer: Medicare Other | Admitting: Family Medicine

## 2018-09-12 ENCOUNTER — Encounter: Payer: Self-pay | Admitting: Family Medicine

## 2018-09-12 VITALS — Ht 62.5 in | Wt 201.0 lb

## 2018-09-12 DIAGNOSIS — Z0000 Encounter for general adult medical examination without abnormal findings: Secondary | ICD-10-CM

## 2018-09-12 DIAGNOSIS — Z1239 Encounter for other screening for malignant neoplasm of breast: Secondary | ICD-10-CM

## 2018-09-12 NOTE — Patient Instructions (Signed)
You spoke to Kimberly Arlo Butt, DO over the phone for your annual wellness visit.  We discussed goals: Goals    . Exercise 5x per week (30 min per time) (pt-stated)     Walking    . Weight (lb) < 200 lb (90.7 kg) (pt-stated)       We also discussed recommended health maintenance. Please call our office and schedule a visit. As discussed, you are due for:  Your mammogram: I placed the order and they will call you. Your screening for colon cancer: I have attached information regarding FIT testing that you may do instead of a colonoscopy.  Your eye exam is due, please schedule once you feel safe to go to the doctor.  You are due for diabetic screening in the office including, urine microalbumin, A1c, foot exam. Please schedule an appointment as soon as you are able.   Our clinic's number is [phone]. Please call with questions or concerns about what we discussed today.

## 2018-09-12 NOTE — Progress Notes (Signed)
Subjective:   [name] is a 53 y.o. female who presents for Medicare Annual (Subsequent) preventive examination.  The patient consented to a virtual visit.  Patient reports that initially during the pandemic, she was having issues with money, but that has now resolved. She saiid she is walking dily and trying to stay positive.   Review of Systems:  Cardiac Risk Factors include: diabetes mellitus;hypertension;obesity (BMI >30kg/m2);smoking/ tobacco exposure     Objective:     Vitals: Ht 5' 2.5" (1.588 m)    Wt 201 lb (91.2 kg)    BMI 36.18 kg/m   Body mass index is 36.18 kg/m.  Advanced Directives 09/12/2018 02/20/2018 06/07/2017 06/02/2017 05/19/2017 05/17/2017 05/03/2017  Does Patient Have a Medical Advance Directive? No No No No No No No  Would patient like information on creating a medical advance directive? No - Patient declined No - Patient declined No - Patient declined No - Patient declined No - Patient declined - No - Patient declined    Tobacco Social History   Tobacco Use  Smoking Status Current Every Day Smoker   Packs/day: 1.00   Years: 15.00   Pack years: 15.00   Types: Cigarettes  Smokeless Tobacco Never Used  Tobacco Comment   Smoking 10-15 cigs per day     Ready to quit: No Counseling given: Not Answered Comment: Smoking 10-15 cigs per day   Clinical Intake:  Pre-visit preparation completed: No Pain : No/denies pain Nutritional Status: BMI > 30  Obese Nutritional Risks: None Diabetes: Yes CBG done?: No Did pt. bring in CBG monitor from home?: No  How often do you need to have someone help you when you read instructions, pamphlets, or other written materials from your doctor or pharmacy?: 1 - Never What is the last grade level you completed in school?: 12  Interpreter Needed?: No     Past Medical History:  Diagnosis Date   Arthritis    Chronic back pain    DDD   Diabetes mellitus without complication (HCC)    takes Metformin daily    Hypertension    Insomnia    takes Melatonin nightly   Joint pain    Lupus (HCC)    takes Plaquenil daily   Substance abuse [facility])    Past Surgical History:  Procedure Laterality Date   APPENDECTOMY     SPINE SURGERY  03/10/2016   Spinal Fusion L5-S1   TONSILLECTOMY     Family History  Problem Relation Age of Onset   Hypertension Mother    Kidney disease Mother    COPD Mother    Heart disease Father    Diabetes Paternal Grandmother    Social History   Socioeconomic History   Marital status: Single    Spouse name: Not on file   Number of children: Not on file   Years of education: Not on file   Highest education level: Not on file  Occupational History   Occupation: food bank  Social Needs   Financial resource strain: Somewhat hard   Food insecurity:    Worry: Never true    Inability: Never true   Transportation needs:    Medical: No    Non-medical: No  Tobacco Use   Smoking status: Current Every Day Smoker    Packs/day: 1.00    Years: 15.00    Pack years: 15.00    Types: Cigarettes   Smokeless tobacco: Never Used   Tobacco comment: Smoking 10-15 cigs per day  Substance  and Sexual Activity   Alcohol use: No    Alcohol/week: 0.0 standard drinks   Drug use: No   Sexual activity: Yes    Birth control/protection: Post-menopausal  Lifestyle   Physical activity:    Days per week: 5 days    Minutes per session: 30 min   Stress: To some extent  Relationships   Social connections:    Talks on phone: More than three times a week    Gets together: More than three times a week    Attends religious service: More than 4 times per year    Active member of club or organization: Yes    Attends meetings of clubs or organizations: More than 4 times per year    Relationship status: Never married  Other Topics Concern   Not on file  Social History Narrative   Current Social History 12/29/2016           Patient lives with 2 roommates in  one level home 12/29/2016   Transportation: Patient uses [employer] 12/29/2016   Important Relationships "Family" 12/29/2016    Pets: None 12/29/2016   Education / Work:  2 years college/ Disabled 12/29/2016   Interests / Fun: Read, fish, social media 12/29/2016   Current Stressors: "Money" 12/29/2016   L. Ducatte, RN, BSN                                                                                                  Outpatient Encounter Medications as of 09/12/2018  Medication Sig   glucose blood (ONETOUCH VERIO) test strip Use as instructed to check blood sugar once daily. ICD-10 code: E11.65.   hydroxychloroquine (PLAQUENIL) 200 MG tablet TAKE 1 TABLET BY MOUTH TWICE A DAY   Melatonin 5 MG TABS Take 10 mg by mouth at bedtime.   metFORMIN (GLUCOPHAGE-XR) 750 MG 24 hr tablet TAKE 1 TABLET BY MOUTH EVERY DAY WITH BREAKFAST   metFORMIN (GLUCOPHAGE) 500 MG tablet Take 1 tablet (500 mg total) by mouth 2 (two) times daily with a meal. (Patient not taking: Reported on 09/12/2018)   [DISCONTINUED] hydroxychloroquine (PLAQUENIL) 200 MG tablet TAKE 1 TABLET BY MOUTH TWICE A DAY   No facility-administered encounter medications on file as of 09/12/2018.     Activities of Daily Living In your present state of health, do you have any difficulty performing the following activities: 09/12/2018  Hearing? N  Vision? Y  Comment needs new perscription  Difficulty concentrating or making decisions? N  Walking or climbing stairs? Y  Comment spinal fusion surgery  Dressing or bathing? N  Doing errands, shopping? N  Preparing Food and eating ? N  Using the Toilet? N  In the past six months, have you accidently leaked urine? Y  Comment laughing, coughing  Do you have problems with loss of bowel control? N  Managing your Medications? N  Managing your Finances? N  Housekeeping or managing your Housekeeping? N  Some recent data might be hidden    Patient Care Team: Jodell Weitman, [place], DO as PCP - General (Family  Medicine) Sallye Lat, MD as Consulting Physician (Ophthalmology)  Assessment:   This is a routine wellness examination for [name].  Exercise Activities and Dietary recommendations Current Exercise Habits: Home exercise routine, Type of exercise: walking, Time (Minutes): 30, Frequency (Times/Week): 5, Weekly Exercise (Minutes/Week): 150, Exercise limited by: orthopedic condition(s)  Goals     Exercise 5x per week (30 min per time) (pt-stated)     Walking     Weight (lb) < 200 lb (90.7 kg) (pt-stated)       Fall Risk Fall Risk  09/12/2018 12/29/2016 12/29/2016 12/29/2016 02/08/2016  Falls in the past year? 0 - - Yes No  Number falls in past yr: 0 - - 2 or more -  Injury with Fall? 0 - - Yes -  Comment - - - vertebral collapse -  Risk Factor Category  - High Fall Risk (No Data) High Fall Risk -  Comment - - PCP notified - -  Risk for fall due to : - - - History of fall(s);Impaired balance/gait;Impaired mobility Other (Comment)  Follow up - - - Falls evaluation completed;Education provided;Falls prevention discussed -   Is the patient's home free of loose throw rugs in walkways, pet beds, electrical cords, etc?   yes      Grab bars in the bathroom? no      Handrails on the stairs?   yes      Adequate lighting?   yes  Patient rating of health (0-10) scale:    Depression Screen PHQ 2/9 Scores 09/12/2018 02/20/2018 06/07/2017 06/02/2017  PHQ - 2 Score 0 0 0 0  Exception Documentation - - - -     Cognitive Function MMSE - Mini Mental State Exam 09/12/2018  Not completed: Unable to complete     6CIT Screen 09/12/2018  What Year? 0 points  What month? 0 points  What time? 0 points  Count back from 20 0 points  Months in reverse 0 points  Repeat phrase 2 points  Total Score 2    Immunization History  Administered Date(s) Administered   Influenza Whole 03/12/2009, 03/21/2013    Qualifies for Shingles Vaccine?n/a  Screening Tests Health Maintenance  Topic Date Due    PNEUMOCOCCAL POLYSACCHARIDE VACCINE AGE 41-64 HIGH RISK  11/26/1965   MAMMOGRAM  11/26/2013   COLONOSCOPY  11/26/2013   OPHTHALMOLOGY EXAM  07/19/2017   FOOT EXAM  12/29/2017   URINE MICROALBUMIN  12/29/2017   HEMOGLOBIN A1C  08/22/2018   INFLUENZA VACCINE  11/24/2018   PAP SMEAR-Modifier  06/24/2019   TETANUS/TDAP  06/05/2021   Hepatitis C Screening  Completed   HIV Screening  Completed    Cancer Screenings: Lung: Low Dose CT Chest recommended if Age 67-80 years, 30 pack-year currently smoking OR have quit w/in 15years. Patient does not qualify. Breast:  Up to date on Mammogram? No   Up to date of Bone Density/Dexa? No, n/a Colorectal: patient may do colonoscopy but interested in home it testing due to covid  Additional Screenings: Hepatitis C Screening:      Plan:    Patient to continue exercising and focusing on her diet choices.  Patient interested in FIT testing.  Patient will call her eye doctor after COVID-19 pandemic due to her Lupus.    I have personally reviewed and noted the following in the patients chart:    Medical and social history  Use of alcohol, tobacco or illicit drugs   Current medications and supplements  Functional ability and status  Nutritional status  Physical activity  Advanced directives  List  of other physicians  Hospitalizations, surgeries, and ER visits in previous 12 months  Vitals  Screenings to include cognitive, depression, and falls  Referrals and appointments  In addition, I have reviewed and discussed with patient certain preventive protocols, quality metrics, and best practice recommendations. A written personalized care plan for preventive services as well as general preventive health recommendations were provided to patient.    This visit was conducted virtually in the setting of the COVID19 pandemic.    [place] Tinsleigh Slovacek, DO PGY-2, [facility]  09/12/2018

## 2018-10-23 ENCOUNTER — Other Ambulatory Visit: Payer: Self-pay

## 2018-10-23 ENCOUNTER — Telehealth (INDEPENDENT_AMBULATORY_CARE_PROVIDER_SITE_OTHER): Payer: Medicare Other | Admitting: Family Medicine

## 2018-10-23 DIAGNOSIS — S0592XA Unspecified injury of left eye and orbit, initial encounter: Secondary | ICD-10-CM | POA: Diagnosis not present

## 2018-10-23 MED ORDER — ERYTHROMYCIN 5 MG/GM OP OINT: 1.0000 "application " | TOPICAL_OINTMENT | Freq: Four times a day (QID) | OPHTHALMIC | 0 refills | Status: DC

## 2018-10-23 NOTE — Assessment & Plan Note (Addendum)
Self injury to eye during sleep. Patient with some discomfort with blinking and burning with tearing. Virtual visit given patient with pending covid test. Would ideally like for patient to have fluorescein exam given hx. Patient denies pain with movement of eye, and has no notable swelling.  -Will prescribe topical antibiotics in case patient has abrasion that we cannot currently evaluate. Erythromycin 0.5% ophthalmic ointment QID for 5 days - strict return precautions discussed and patient voiced understanding.  -she is going to call her eye doctor to attempt to move up the appointment

## 2018-10-23 NOTE — Progress Notes (Signed)
[place] Telemedicine Visit  Patient consented to have virtual visit. Method of visit: Video  Encounter participants: Patient: [name] - located at home Provider: [place] Isiaah Cuervo - located at home Others (if applicable): n/a  Chief Complaint: eye trauma  HPI: Patient reports that last night in her sleep she was fluffing the pillow and she accidentally punched her self in the eye. She says she has long fingernails and she is worried that she may have cut her eye. She said that it hurts and it is uncomfortable. Its red. When she tears up, it burns. Her eye is not swollen. She is able to move it around. She also is having trouble seeing out of her peripheries, but does not know if it is related. She has an eye doctor appointment at the end of July and a pending covid 19 test that she should know the results of by tomorrow. She denies any pain with eye movement or headaches.   ROS: per HPI  Pertinent PMHx: T2DM, lupus, HTN  Exam:  General: well-appearing walking around home with ease Eyes: difficult exam virtually, but extraocular movement intact with no notable swelling of orbit, or notable redness around eye. Injected sclera present.  Respiratory: speaking in complete sentences, breathing comfortably on RA  Assessment/Plan:  Left eye injury Self injury to eye during sleep. Patient with some discomfort with blinking and burning with tearing. Virtual visit given patient with pending covid test. Would ideally like for patient to have fluorescein exam given hx. Patient denies pain with movement of eye, and has no notable swelling.  -Will prescribe topical antibiotics in case patient has abrasion that we cannot currently evaluate. Erythromycin 0.5% ophthalmic ointment QID for 5 days - strict return precautions discussed and patient voiced understanding.  -she is going to call her eye doctor to attempt to move up the appointment    Time spent during visit with  patient: 13 minutes

## 2018-11-15 ENCOUNTER — Telehealth: Payer: Self-pay | Admitting: *Deleted

## 2018-11-15 NOTE — Telephone Encounter (Signed)
Pt received a letter from pharmacy that her metformin ER is recalled .  She states that the normal metformin upsets her stomach so bad and she would rather try a different medication instead of going back on regular metformin.  To PCP. Christen Bame, CMA

## 2018-11-19 NOTE — Telephone Encounter (Signed)
Fax received from pharmacy with same request.  [name], CMA

## 2018-11-20 NOTE — Telephone Encounter (Signed)
Patient may still safely take metformin ER as it should only be certain batches affected. I attempted to call to explain but there was no answer. Can send in another script but metformin remains recommended treatment.   Thank you

## 2018-11-22 ENCOUNTER — Ambulatory Visit: Payer: Medicare Other

## 2019-01-01 ENCOUNTER — Ambulatory Visit (INDEPENDENT_AMBULATORY_CARE_PROVIDER_SITE_OTHER): Payer: Medicare Other | Admitting: Family Medicine

## 2019-01-01 ENCOUNTER — Other Ambulatory Visit (HOSPITAL_COMMUNITY)
Admission: RE | Admit: 2019-01-01 | Discharge: 2019-01-01 | Disposition: A | Discharge status: Home / Self Care | Payer: Medicare Other | Source: Ambulatory Visit | Attending: Family Medicine | Admitting: Family Medicine

## 2019-01-01 ENCOUNTER — Encounter: Payer: Self-pay | Admitting: Family Medicine

## 2019-01-01 ENCOUNTER — Other Ambulatory Visit: Payer: Self-pay

## 2019-01-01 VITALS — BP 128/80 | HR 85 | Wt 213.0 lb

## 2019-01-01 DIAGNOSIS — Z0000 Encounter for general adult medical examination without abnormal findings: Secondary | ICD-10-CM | POA: Insufficient documentation

## 2019-01-01 DIAGNOSIS — B3731 Acute candidiasis of vulva and vagina: Secondary | ICD-10-CM

## 2019-01-01 DIAGNOSIS — Z8619 Personal history of other infectious and parasitic diseases: Secondary | ICD-10-CM

## 2019-01-01 DIAGNOSIS — E1165 Type 2 diabetes mellitus with hyperglycemia: Secondary | ICD-10-CM

## 2019-01-01 DIAGNOSIS — B373 Candidiasis of vulva and vagina: Secondary | ICD-10-CM | POA: Diagnosis not present

## 2019-01-01 LAB — POCT WET PREP (WET MOUNT): Clue Cells Wet Prep Whiff POC: POSITIVE

## 2019-01-01 MED ORDER — METRONIDAZOLE 500 MG PO TABS: 500.0000 mg | ORAL_TABLET | Freq: Two times a day (BID) | ORAL | 0 refills | Status: DC

## 2019-01-01 MED ORDER — AZITHROMYCIN 500 MG PO TABS
1000.0000 mg | ORAL_TABLET | Freq: Once | ORAL | Status: AC
  Administered 2019-01-01: 11:00:00 1000 mg via ORAL

## 2019-01-01 MED ORDER — FLUCONAZOLE 150 MG PO TABS: 150.0000 mg | ORAL_TABLET | Freq: Once | ORAL | 0 refills | Status: AC

## 2019-01-01 NOTE — Patient Instructions (Addendum)
Thank you for allowing me to take part in your care today.   Today we discussed:   1. Vaginal Candidiasis  -Please take the prescribed medication in order to treat these symptoms.     -Your symptoms are likely associated with your elevated blood sugars, so it is important to manage your  blood sugars at home and take your diabetes medications.     -If these symptoms persist, please notify our office so that we can further manage your symptoms.   2. You were tested for sexually transmitted infections today. We will notify you of any abnormal results.   3. You were treated with Azithromycin today for potential chlamydia infection. It is important that both you and your partner refrain from sexual activity for 7 days and that you are both treated.   4. Please follow up with Dr. Enid Derry for diabetes follow up and to have further sexually transmitted infections blood work and hemoglobin A1c and your gastrointestinal concerns.   Thank you,  Dr. Rosita Fire

## 2019-01-01 NOTE — Assessment & Plan Note (Addendum)
Patient reports vaginal pruritis for 7 days with white discharge. Patient reports trying OTC medication for the pruritis with no relief of her symptoms adding that when her "diabetes is out of control" she tends to have yeast infections and they always feel similar to this current episode.  -prescribed Fluconazole 150 mg

## 2019-01-01 NOTE — Assessment & Plan Note (Addendum)
Last Hbg A1c 11.3 in 01/2018. Discussed with patient the importance of blood glucose management in order to prevent further yeast infections and decrease risk of complications of diabetes. Patient requests to have follow up with her PCP, Dr. Enid Derry in order to get further blood work and further discuss her diabetes medications. She is also complaining of having trouble tolerating her metformin and is having GI upset regularly. Recommended repeat Hbb A1c as well as urine microalbumin during follow up appointment.

## 2019-01-01 NOTE — Progress Notes (Addendum)
Patient Name: Kimberly QuakerDoris R Golda Date of Birth: 09/11/1965 Date of Visit: 01/01/19 PCP: Shirley, [place], DO  Chief Complaint:   Subjective: [name] is a pleasant 53 y.o. with medical history significant for DM2, HTN and SLE presenting today for vaginal pruritis.   Patient is also interested in having STI testing today as she has been having greenish-yellow vaginal discharge that she is concerned may be chlamydia. Patient reports being diagnosed with this in the past (Nov 2019). She has been experiencing this discharge for 5 days in addition to vaginal pruritis for 7 days that has been progressively getting worse. Patient states that she was treated for chlamydia in Nov 2019 and did not take the medication as prescribed so she believes neither she nor her partner were treated fully. Patient also reports vaginal burning sensation.   Patient also adds that she is having a difficult time tolerating the Metformin as her GI system is upset regularly. She reports that this pain seems to be in the upper part of her abdomen and denies any constipation or diarrhea but reports that this new sensation feels like hunger pains combined with abdominal cramps. She also reports that she went without her diabetes medications for 1 month because the medicine was recalled and she was unable to get her medications. She goes on to state that she has gained weight and has not been eating appropriately so she believes her hemoglobin A1c is likely even more elevated than it was before. Patient states that she would like to further discuss these issues with her PCP and focus on her vaginal symptoms during today's visit.    ROS: Per HPI.   I have reviewed the patient's medical, surgical, family, and social history as appropriate.  Vitals:   01/01/19 1022  BP: 128/80  Pulse: 85  SpO2: 98%    Physical Exam:  General: Alert and cooperative and appears to be in no acute distress HEENT: Neck non-tender without  lymphadenopathy, masses or thyromegaly Cardio: Normal S1 and S2, no S3 or S4. Rhythm is regular. No murmurs or rubs.   Pulm: Clear to auscultation bilaterally, no crackles, wheezing, or diminished breath sounds. Normal respiratory effort Abdomen: Bowel sounds normal. Abdomen soft and non-tender.  Extremities: No peripheral edema. Warm/ well perfused.  Genital exam: copious watery, green/yellow discharge, erythematous cervix, normal external genitalia Neuro: alert and oriented x3   Assessment and Plan:   Healthcare maintenance Patient has history of trichomonas infection and has presented with mulitple yeast infections likely secondary to uncontrolled DM. Patient is declining further STI testing including HIV, Hep C and RPR, requesting to do further testing during an appointment with her PCP. Patient is requesting to discuss further health maintenance items during this future appointment scheduled for Sept 23, 2020.   Vaginal candidiasis Patient reports vaginal pruritis for 7 days with white discharge. Patient reports trying OTC medication for the pruritis with no relief of her symptoms adding that when her "diabetes is out of control" she tends to have yeast infections and they always feel similar to this current episode.  -prescribed Fluconazole 150 mg   Type 2 diabetes mellitus, uncontrolled (HCC) Last Hbg A1c 11.3 in 01/2018. Discussed with patient the importance of blood glucose management in order to prevent further yeast infections and decrease risk of complications of diabetes. Patient requests to have follow up with her PCP, Dr. Talbert ForestShirley in order to get further blood work and further discuss her diabetes medications. She is also complaining of having trouble  tolerating her metformin and is having GI upset regularly. Recommended repeat Hbb A1c as well as urine microalbumin during follow up appointment.    History of Chlamydial Infection Patient was evaluated for EPT and meets criteria  listed below. Patient education about EPT was provided and patient Declined. Provided EPT medication samples in clinic along with educational materials for partner. Patient was advised to contact clinic if questions or concerns arise and verbalized understanding of information by repeat back.  Drug: Azithromycin Strength: 500mg    Inclusion criteria: . The index case has a diagnosis of chlamydia or trichomoniasis . The index case has been examined, tested and treated in the facility providing the partner therapy. . The partner of the index case is unlikely to present for examination and treatment.  . Therapy is being prescribed as outlined in the treatment protocol.  . The partner has no contraindication to treatment. . If the partner has information available via electronic medical record (EMR), the provider will review the record for information to support the therapy plan. . Known sex partner or recent sex partner. . Names and demographic information are provided to comply with medication dispensing and pharmacy prescribing regulations requiring the name on a written prescription. NCGS 106-134.  Exclusion criteria: . Individuals requiring multi-dose treatment regimen. . Men who have sex with men (MSM) . Individuals with known allergies or other contraindications for the treatment.  Note: Adverse reaction to treatment is rare.   Return to care if symptoms worsen.   Addendum:  After the close of the visit, patient's wet mount revealed that the patient had trichomoniasis. Metronidazole was ordered (500mg  twice daily for 7 days) and patient was called and notified of her results and the pending prescription as well as counseled to avoid sex for 10 days. Patient asked if her partner would also need to be treated to which she was counseled that he would need to be evaluated at the health department for treatment since the patient had already left the family medicine center at that time. Patient  verbalized understanding of this.    Stark Klein, MD  Family Medicine

## 2019-01-01 NOTE — Assessment & Plan Note (Addendum)
Patient has history of trichomonas infection and has presented with mulitple yeast infections likely secondary to uncontrolled DM. Patient is declining further STI testing including HIV, Hep C and RPR, requesting to do further testing during an appointment with her PCP. Patient is requesting to discuss further health maintenance items during this future appointment scheduled for Sept 23, 2020.

## 2019-01-02 LAB — CERVICOVAGINAL ANCILLARY ONLY
Chlamydia: NEGATIVE
Neisseria Gonorrhea: NEGATIVE

## 2019-01-16 ENCOUNTER — Other Ambulatory Visit: Payer: Self-pay

## 2019-01-16 ENCOUNTER — Ambulatory Visit (INDEPENDENT_AMBULATORY_CARE_PROVIDER_SITE_OTHER): Payer: Medicare Other | Admitting: Family Medicine

## 2019-01-16 ENCOUNTER — Encounter: Payer: Self-pay | Admitting: Family Medicine

## 2019-01-16 VITALS — BP 130/70 | HR 90 | Ht 63.0 in | Wt 211.4 lb

## 2019-01-16 DIAGNOSIS — I10 Essential (primary) hypertension: Secondary | ICD-10-CM | POA: Diagnosis not present

## 2019-01-16 DIAGNOSIS — M329 Systemic lupus erythematosus, unspecified: Secondary | ICD-10-CM

## 2019-01-16 DIAGNOSIS — M5441 Lumbago with sciatica, right side: Secondary | ICD-10-CM

## 2019-01-16 DIAGNOSIS — G8929 Other chronic pain: Secondary | ICD-10-CM

## 2019-01-16 DIAGNOSIS — E1165 Type 2 diabetes mellitus with hyperglycemia: Secondary | ICD-10-CM

## 2019-01-16 DIAGNOSIS — Z0000 Encounter for general adult medical examination without abnormal findings: Secondary | ICD-10-CM | POA: Diagnosis not present

## 2019-01-16 LAB — POCT GLYCOSYLATED HEMOGLOBIN (HGB A1C): HbA1c POC (<> result, manual entry): >15 % (ref 4.0–5.6)

## 2019-01-16 MED ORDER — ONETOUCH VERIO W/DEVICE KIT: 1.0000 [IU] | PACK | Freq: Once | 0 refills | Status: AC

## 2019-01-16 MED ORDER — ONETOUCH DELICA LANCETS 33G MISC: 1.0000 [IU] | Freq: Every day | 2 refills | Status: DC

## 2019-01-16 MED ORDER — DICLOFENAC SODIUM 1 % TD GEL: 4.0000 g | Freq: Four times a day (QID) | TRANSDERMAL | 1 refills | Status: DC

## 2019-01-16 MED ORDER — ONETOUCH VERIO VI STRP: ORAL_STRIP | 2 refills | Status: DC

## 2019-01-16 MED ORDER — ONETOUCH DELICA LANCING DEV MISC: 1.0000 [IU] | Freq: Every day | 2 refills | Status: DC

## 2019-01-16 MED ORDER — METFORMIN HCL ER 500 MG PO TB24: 500.0000 mg | ORAL_TABLET | Freq: Every day | ORAL | 3 refills | Status: DC

## 2019-01-16 MED ORDER — OZEMPIC (0.25 OR 0.5 MG/DOSE) 2 MG/1.5ML ~~LOC~~ SOPN: 0.2500 mg | PEN_INJECTOR | SUBCUTANEOUS | 0 refills | Status: DC

## 2019-01-16 NOTE — Patient Instructions (Addendum)
Thank you for coming to see me today. It was a pleasure! Today we talked about:   Your diabetes: Inject 0.25 mg once weekly for 4 weeks, then increase to 0.5 mg once weekly.   I have sent in a cream to use on your back.   Please follow-up with me in 4-6 weeks or sooner as needed.  If you have any questions or concerns, please do not hesitate to call the office at [phone].  Take Care,   [place] Sharen Youngren, DO   Diet Recommendations for Diabetes  Carbohydrate includes starch, sugar, and fiber.  Of these, only sugar and starch raise blood glucose.  (Fiber is found in fruits, vegetables [especially skin, seeds, and stalks] and whole grains.)   Starchy (carb) foods: Bread, rice, pasta, potatoes, corn, cereal, grits, crackers, bagels, muffins, all baked goods.  (Fruit, milk, and yogurt also have carbohydrate, but most of these foods will not spike your blood sugar as most starchy foods will.)  A few fruits do cause high blood sugars; use small portions of bananas (limit to 1/2 at a time), grapes, watermelon, oranges, and most tropical fruits.   Protein foods: Meat, fish, poultry, eggs, dairy foods, and beans such as pinto and kidney beans (beans also provide carbohydrate).   1. Eat at least REAL 3 meals and 1-2 snacks per day. Never go more than 4-5 hours while awake without eating. Eat breakfast within the first hour of getting up.   2. Limit starchy foods to TWO per meal and ONE per snack. ONE portion of a starchy  food is equal to the following:   - ONE slice of bread (or its equivalent, such as half of a hamburger bun).   - 1/2 cup of a "scoopable" starchy food such as potatoes or rice.   - 15 grams of Total Carbohydrate as shown on food label.  3. Include at every meal: a protein food, a carb food, and vegetables and/or fruit.   - Obtain twice the volume of veg's as protein or carbohydrate foods for both lunch and dinner.   - Fresh or frozen veg's are best.   - Keep frozen veg's on hand  for a quick vegetable serving.      Semaglutide (Ozempic) injection solution What is this medicine? SEMAGLUTIDE (Sem a GLOO tide) is used to improve blood sugar control in adults with type 2 diabetes. This medicine may be used with other diabetes medicines. This drug may also reduce the risk of heart attack or stroke if you have type 2 diabetes and risk factors for heart disease. This medicine may be used for other purposes; ask your health care provider or pharmacist if you have questions. COMMON BRAND NAME(S): OZEMPIC What should I tell my health care provider before I take this medicine? They need to know if you have any of these conditions:  endocrine tumors (MEN 2) or if someone in your family had these tumors  eye disease, vision problems  history of pancreatitis  kidney disease  stomach problems  thyroid cancer or if someone in your family had thyroid cancer  an unusual or allergic reaction to semaglutide, other medicines, foods, dyes, or preservatives  pregnant or trying to get pregnant  breast-feeding How should I use this medicine? This medicine is for injection under the skin of your upper leg (thigh), stomach area, or upper arm. It is given once every week (every 7 days). You will be taught how to prepare and give this medicine. Use exactly  as directed. Take your medicine at regular intervals. Do not take it more often than directed. If you use this medicine with insulin, you should inject this medicine and the insulin separately. Do not mix them together. Do not give the injections right next to each other. Change (rotate) injection sites with each injection. It is important that you put your used needles and syringes in a special sharps container. Do not put them in a trash can. If you do not have a sharps container, call your pharmacist or healthcare provider to get one. A special MedGuide will be given to you by the pharmacist with each prescription and refill. Be sure  to read this information carefully each time. Talk to your pediatrician regarding the use of this medicine in children. Special care may be needed. Overdosage: If you think you have taken too much of this medicine contact a poison control center or emergency room at once. NOTE: This medicine is only for you. Do not share this medicine with others. What if I miss a dose? If you miss a dose, take it as soon as you can within 5 days after the missed dose. Then take your next dose at your regular weekly time. If it has been longer than 5 days after the missed dose, do not take the missed dose. Take the next dose at your regular time. Do not take double or extra doses. If you have questions about a missed dose, contact your health care provider for advice. What may interact with this medicine?  other medicines for diabetes Many medications may cause changes in blood sugar, these include:  alcohol containing beverages  antiviral medicines for HIV or AIDS  aspirin and aspirin-like drugs  certain medicines for blood pressure, heart disease, irregular heart beat  chromium  diuretics  female hormones, such as estrogens or progestins, birth control pills  fenofibrate  gemfibrozil  isoniazid  lanreotide  female hormones or anabolic steroids  MAOIs like Carbex, Eldepryl, Marplan, Nardil, and Parnate  medicines for weight loss  medicines for allergies, asthma, cold, or cough  medicines for depression, anxiety, or psychotic disturbances  niacin  nicotine  NSAIDs, medicines for pain and inflammation, like ibuprofen or naproxen  octreotide  pasireotide  pentamidine  phenytoin  probenecid  quinolone antibiotics such as ciprofloxacin, levofloxacin, ofloxacin  some herbal dietary supplements  steroid medicines such as prednisone or cortisone  sulfamethoxazole; trimethoprim  thyroid hormones Some medications can hide the warning symptoms of low blood sugar (hypoglycemia).  You may need to monitor your blood sugar more closely if you are taking one of these medications. These include:  beta-blockers, often used for high blood pressure or heart problems (examples include atenolol, metoprolol, propranolol)  clonidine  guanethidine  reserpine This list may not describe all possible interactions. Give your health care provider a list of all the medicines, herbs, non-prescription drugs, or dietary supplements you use. Also tell them if you smoke, drink alcohol, or use illegal drugs. Some items may interact with your medicine. What should I watch for while using this medicine? Visit your doctor or health care professional for regular checks on your progress. Drink plenty of fluids while taking this medicine. Check with your doctor or health care professional if you get an attack of severe diarrhea, nausea, and vomiting. The loss of too much body fluid can make it dangerous for you to take this medicine. A test called the HbA1C (A1C) will be monitored. This is a simple blood test. It measures  your blood sugar control over the last 2 to 3 months. You will receive this test every 3 to 6 months. Learn how to check your blood sugar. Learn the symptoms of low and high blood sugar and how to manage them. Always carry a quick-source of sugar with you in case you have symptoms of low blood sugar. Examples include hard sugar candy or glucose tablets. Make sure others know that you can choke if you eat or drink when you develop serious symptoms of low blood sugar, such as seizures or unconsciousness. They must get medical help at once. Tell your doctor or health care professional if you have high blood sugar. You might need to change the dose of your medicine. If you are sick or exercising more than usual, you might need to change the dose of your medicine. Do not skip meals. Ask your doctor or health care professional if you should avoid alcohol. Many nonprescription cough and cold  products contain sugar or alcohol. These can affect blood sugar. Pens should never be shared. Even if the needle is changed, sharing may result in passing of viruses like hepatitis or HIV. Wear a medical ID bracelet or chain, and carry a card that describes your disease and details of your medicine and dosage times. Do not become pregnant while taking this medicine. Women should inform their doctor if they wish to become pregnant or think they might be pregnant. There is a potential for serious side effects to an unborn child. Talk to your health care professional or pharmacist for more information. What side effects may I notice from receiving this medicine? Side effects that you should report to your doctor or health care professional as soon as possible:  allergic reactions like skin rash, itching or hives, swelling of the face, lips, or tongue  breathing problems  changes in vision  diarrhea that continues or is severe  lump or swelling on the neck  severe nausea  signs and symptoms of infection like fever or chills; cough; sore throat; pain or trouble passing urine  signs and symptoms of low blood sugar such as feeling anxious, confusion, dizziness, increased hunger, unusually weak or tired, sweating, shakiness, cold, irritable, headache, blurred vision, fast heartbeat, loss of consciousness  signs and symptoms of kidney injury like trouble passing urine or change in the amount of urine  trouble swallowing  unusual stomach upset or pain  vomiting Side effects that usually do not require medical attention (report to your doctor or health care professional if they continue or are bothersome):  constipation  diarrhea  nausea  pain, redness, or irritation at site where injected  stomach upset This list may not describe all possible side effects. Call your doctor for medical advice about side effects. You may report side effects to FDA at 1-800-FDA-1088. Where should I keep  my medicine? Keep out of the reach of children. Store unopened pens in a refrigerator between 2 and 8 degrees C (36 and 46 degrees F). Do not freeze. Protect from light and heat. After you first use the pen, it can be stored for 56 days at room temperature between 15 and 30 degrees C (59 and 86 degrees F) or in a refrigerator. Throw away your used pen after 56 days or after the expiration date, whichever comes first. Do not store your pen with the needle attached. If the needle is left on, medicine may leak from the pen. NOTE: This sheet is a summary. It may not cover all  possible information. If you have questions about this medicine, talk to your doctor, pharmacist, or health care provider.  2020 Elsevier/Gold Standard (2018-05-11 14:25:32)
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
## 2019-01-17 ENCOUNTER — Encounter: Payer: Self-pay | Admitting: *Deleted

## 2019-01-17 LAB — CBC
Hematocrit: 48.4 % — ABNORMAL HIGH (ref 34.0–46.6)
Hemoglobin: 15.3 g/dL (ref 11.1–15.9)
MCH: 27.7 pg (ref 26.6–33.0)
MCHC: 31.6 g/dL (ref 31.5–35.7)
MCV: 88 fL (ref 79–97)
Platelets: 220 10*3/uL (ref 150–450)
RBC: 5.52 x10E6/uL — ABNORMAL HIGH (ref 3.77–5.28)
RDW: 12.8 % (ref 11.7–15.4)
WBC: 7.2 10*3/uL (ref 3.4–10.8)

## 2019-01-17 LAB — BASIC METABOLIC PANEL
BUN/Creatinine Ratio: 15 (ref 9–23)
BUN: 14 mg/dL (ref 6–24)
CO2: 22 mmol/L (ref 20–29)
Calcium: 9.2 mg/dL (ref 8.7–10.2)
Chloride: 101 mmol/L (ref 96–106)
Creatinine, Ser: 0.94 mg/dL (ref 0.57–1.00)
GFR calc Af Amer: 79 mL/min/{1.73_m2} (ref >59)
GFR calc non Af Amer: 69 mL/min/{1.73_m2} (ref >59)
Glucose: 383 mg/dL — ABNORMAL HIGH (ref 65–99)
Potassium: 4.3 mmol/L (ref 3.5–5.2)
Sodium: 138 mmol/L (ref 134–144)

## 2019-01-17 LAB — RPR: RPR Ser Ql: NONREACTIVE

## 2019-01-17 LAB — HIV ANTIBODY (ROUTINE TESTING W REFLEX): HIV Screen 4th Generation wRfx: NONREACTIVE

## 2019-01-17 LAB — HEPATITIS C ANTIBODY: Hep C Virus Ab: 0.1 s/co ratio (ref 0.0–0.9)

## 2019-01-17 LAB — MICROALBUMIN / CREATININE URINE RATIO
Creatinine, Urine: 68.1 mg/dL
Microalb/Creat Ratio: 9 mg/g creat (ref 0–29)
Microalbumin, Urine: 5.8 ug/mL

## 2019-01-21 NOTE — Assessment & Plan Note (Addendum)
Patient with chronic back pain.  Provided with Voltaren gel to apply to the area in her back that hurts, continue Tylenol.  Red flags reviewed and patient will follow-up in 4 to 6 weeks to see if there is any improvement.

## 2019-01-21 NOTE — Assessment & Plan Note (Signed)
Currently diet controlled.

## 2019-01-21 NOTE — Assessment & Plan Note (Addendum)
A1c greater than 15.0 today in office.  Patient currently asymptomatic.  Counseled on importance of continuing to take her metformin, given her GI symptoms will decrease to 500 mg of the ER version.  Started patient on Ozempic today (given sample in clinic) with demonstration and education done by Dr. Valentina Lucks.  She is to use 0.25 mg weekly for 1 month and then increase to 0.5 mg weekly.  -Given new meter for monitoring her blood sugar -Follow-up in 4 to 6 weeks -No microalbuminuria noted today -Counseled patient that if diet and lifestyle do not change then patient may need insulin therapy.  She would like to start with these conservative measures first and at follow-up we can discuss possibility of starting insulin.

## 2019-01-25 ENCOUNTER — Ambulatory Visit (INDEPENDENT_AMBULATORY_CARE_PROVIDER_SITE_OTHER): Payer: Medicare Other

## 2019-01-25 ENCOUNTER — Other Ambulatory Visit: Payer: Self-pay

## 2019-01-25 DIAGNOSIS — Z111 Encounter for screening for respiratory tuberculosis: Secondary | ICD-10-CM

## 2019-01-25 NOTE — Progress Notes (Signed)
Patient presents in nurse clinic for PPD skin test. PPD was placed in right arm, per patients request. Patient to return on 10/5 for skin read. Reminder card given.

## 2019-01-28 ENCOUNTER — Ambulatory Visit: Payer: Medicare Other

## 2019-01-28 ENCOUNTER — Other Ambulatory Visit: Payer: Self-pay

## 2019-01-28 DIAGNOSIS — Z111 Encounter for screening for respiratory tuberculosis: Secondary | ICD-10-CM

## 2019-01-28 LAB — TB SKIN TEST
Induration: 0 mm
TB Skin Test: NEGATIVE

## 2019-01-28 NOTE — Progress Notes (Signed)
Patient presents in nurse clinic for PPD read. PPD read and results entered in [facility]. Result: 0 mm induration. Interpretation: Negative  Note generated and given to patient for employer.

## 2019-02-12 ENCOUNTER — Ambulatory Visit: Payer: Medicare Other | Admitting: Family Medicine

## 2019-02-18 ENCOUNTER — Encounter: Payer: Self-pay | Admitting: Family Medicine

## 2019-02-18 ENCOUNTER — Ambulatory Visit (INDEPENDENT_AMBULATORY_CARE_PROVIDER_SITE_OTHER): Payer: Medicare Other | Admitting: Family Medicine

## 2019-02-18 ENCOUNTER — Other Ambulatory Visit: Payer: Self-pay

## 2019-02-18 VITALS — BP 132/75 | HR 85 | Temp 97.8°F | Wt 207.0 lb

## 2019-02-18 DIAGNOSIS — M5442 Lumbago with sciatica, left side: Secondary | ICD-10-CM

## 2019-02-18 DIAGNOSIS — M545 Low back pain, unspecified: Secondary | ICD-10-CM

## 2019-02-18 DIAGNOSIS — E1165 Type 2 diabetes mellitus with hyperglycemia: Secondary | ICD-10-CM | POA: Diagnosis not present

## 2019-02-18 DIAGNOSIS — G8929 Other chronic pain: Secondary | ICD-10-CM

## 2019-02-18 MED ORDER — NAPROXEN 500 MG PO TABS: 500.0000 mg | ORAL_TABLET | Freq: Two times a day (BID) | ORAL | 0 refills | Status: DC

## 2019-02-18 NOTE — Patient Instructions (Addendum)
Thank you for coming to see me today. It was a pleasure! Today we talked about:   Congratulations on the progress you have made with your diabetes.  I recommend that you begin increasing Ozempic 0.5 mg weekly.  I also recommend that you start taking the Metformin 500 mg twice daily.  In order to decrease GI side effects it is often best taken with a protein such as nuts, avocado or meat.   For your back pain, we will first get an x-ray in order to determine if there is something wrong with a fusion or a fracture that may have been missed initially.  I recommend that you continue taking the Tylenol as needed.  You may continue to use the Voltaren gel if you notice any benefit.   Please follow-up with me in 8 weeks or sooner as needed.  If you have any questions or concerns, please do not hesitate to call the office at [phone].  Take Care,   [place] Berlyn Malina, DO   Diet Recommendations for Diabetes  Carbohydrate includes starch, sugar, and fiber.  Of these, only sugar and starch raise blood glucose.  (Fiber is found in fruits, vegetables [especially skin, seeds, and stalks] and whole grains.)   Starchy (carb) foods: Bread, rice, pasta, potatoes, corn, cereal, grits, crackers, bagels, muffins, all baked goods.  (Fruit, milk, and yogurt also have carbohydrate, but most of these foods will not spike your blood sugar as most starchy foods will.)  A few fruits do cause high blood sugars; use small portions of bananas (limit to 1/2 at a time), grapes, watermelon, oranges, and most tropical fruits.   Protein foods: Meat, fish, poultry, eggs, dairy foods, and beans such as pinto and kidney beans (beans also provide carbohydrate).   1. Eat at least REAL 3 meals and 1-2 snacks per day. Never go more than 4-5 hours while awake without eating. Eat breakfast within the first hour of getting up.   2. Limit starchy foods to TWO per meal and ONE per snack. ONE portion of a starchy  food is equal to the  following:   - ONE slice of bread (or its equivalent, such as half of a hamburger bun).   - 1/2 cup of a "scoopable" starchy food such as potatoes or rice.   - 15 grams of Total Carbohydrate as shown on food label.  3. Include at every meal: a protein food, a carb food, and vegetables and/or fruit.   - Obtain twice the volume of veg's as protein or carbohydrate foods for both lunch and dinner.   - Fresh or frozen veg's are best.   - Keep frozen veg's on hand for a quick vegetable serving.
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
## 2019-02-19 NOTE — Assessment & Plan Note (Signed)
6 weeks of acute pain. No red flags. Pain over left greater trochanter which patient reports is the same pain she had previously with her back pain.  -Will order Xray of lumbar spine to ensure that patient has had no injury to previous surgery site or new fractures -Patient encouraged to continue with acetaminophen and will trial 2 weeks of naproxen for anti-inflammatory -Patient with improving CBGs during visit, if she has continued pain after a trial of NSAID therapy with acetaminophen and x-ray is normal will trial injection of corticosteroid over greater trochanter, if there is continued concern for possible greater guard trochanter bursitis -Patient may also benefit from PT

## 2019-02-19 NOTE — Assessment & Plan Note (Signed)
Patient CBGs on monitor are much improved with an average around 170s in the past week.  She states that her diet has improved but is not where she would like for it to be at the time.  She is increasing her Ozempic to 0.5 mg weekly this week.  She initially had 2 weeks of symptoms but those are now resolved. -Increase Metformin to 500 mg twice daily with high-protein meal in order to decrease GI symptoms -Increase Ozempic to 0.5 mg weekly -Patient to return in 2 months for repeat A1c and encouraged to continue her current diet changes
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
## 2019-03-07 ENCOUNTER — Ambulatory Visit: Payer: Medicare Other | Admitting: Rheumatology

## 2019-04-17 ENCOUNTER — Ambulatory Visit: Payer: Medicare Other | Admitting: Rheumatology

## 2019-05-20 ENCOUNTER — Encounter: Payer: Self-pay | Admitting: Family Medicine

## 2019-05-20 ENCOUNTER — Ambulatory Visit
Admission: RE | Admit: 2019-05-20 | Discharge: 2019-05-20 | Disposition: A | Discharge status: Home / Self Care | Payer: Medicare Other | Source: Ambulatory Visit | Attending: Family Medicine | Admitting: Family Medicine

## 2019-05-20 ENCOUNTER — Other Ambulatory Visit: Payer: Self-pay

## 2019-05-20 ENCOUNTER — Ambulatory Visit (INDEPENDENT_AMBULATORY_CARE_PROVIDER_SITE_OTHER): Payer: Medicare Other | Admitting: Family Medicine

## 2019-05-20 VITALS — BP 130/70 | HR 82 | Ht 62.0 in | Wt 213.1 lb

## 2019-05-20 DIAGNOSIS — S6991XA Unspecified injury of right wrist, hand and finger(s), initial encounter: Secondary | ICD-10-CM | POA: Diagnosis not present

## 2019-05-20 DIAGNOSIS — M79641 Pain in right hand: Secondary | ICD-10-CM | POA: Diagnosis not present

## 2019-05-20 DIAGNOSIS — M7989 Other specified soft tissue disorders: Secondary | ICD-10-CM | POA: Diagnosis not present

## 2019-05-20 NOTE — Patient Instructions (Signed)
Thank you for coming to see me today. It was a pleasure. Today we talked about:   Your right hand pain:  I have placed an order for x-rays of your hand.  Please go to [facility] or at [facility] to have this completed.  You do not need an appointment, but if you would like to call them beforehand, their number is [phone].  We will contact you with your results afterwards.  Continue to ice the area, elevate it, and use tylenol as needed.  We will call with your results and go from there.  If you have any questions or concerns, please do not hesitate to call the office at [phone].  Best,   Luis Abed, DO
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
## 2019-05-20 NOTE — Assessment & Plan Note (Addendum)
Magnificent of injury is rather unclear, but it seems unlikely that this would be a fracture.  We will send patient for x-rays however to ensure that it is not, given significant swelling.  It is rather reassuring that she does not have tenderness palpation along her palmar aspect of the second MCP.  Patient was placed in a SAM splint and 20 degrees of extension at the wrist, with protection over second through fifth MCP.  Will call patient with x-ray results.  Consider hand surgery referral if has fracture.  Tylenol, ice, elevation discussed.
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
## 2019-05-28 ENCOUNTER — Ambulatory Visit: Payer: Medicare Other | Admitting: Rheumatology

## 2019-06-02 DIAGNOSIS — Z03818 Encounter for observation for suspected exposure to other biological agents ruled out: Secondary | ICD-10-CM | POA: Diagnosis not present

## 2019-06-04 ENCOUNTER — Telehealth (INDEPENDENT_AMBULATORY_CARE_PROVIDER_SITE_OTHER): Payer: Medicare Other | Admitting: Family Medicine

## 2019-06-04 ENCOUNTER — Other Ambulatory Visit: Payer: Self-pay

## 2019-06-04 DIAGNOSIS — Z5320 Procedure and treatment not carried out because of patient's decision for unspecified reasons: Secondary | ICD-10-CM

## 2019-06-04 NOTE — Progress Notes (Signed)
Attempted to call x5, all at different times. No response. No VM set up to leave message.   Orpah Clinton, PGY-3 [facility] 06/04/2019 3:27 PM

## 2019-06-06 ENCOUNTER — Telehealth (INDEPENDENT_AMBULATORY_CARE_PROVIDER_SITE_OTHER): Payer: Medicare Other | Admitting: Family Medicine

## 2019-06-06 ENCOUNTER — Encounter: Payer: Self-pay | Admitting: Family Medicine

## 2019-06-06 ENCOUNTER — Ambulatory Visit: Payer: Medicare Other | Admitting: Family Medicine

## 2019-06-06 DIAGNOSIS — J069 Acute upper respiratory infection, unspecified: Secondary | ICD-10-CM

## 2019-06-06 NOTE — Progress Notes (Signed)
[id number] - , [place] - [address]  BP- N/A  T- NO FEVER  213#  Still has cough, and needs a note to return to work. Aquilla Solian, CMA

## 2019-06-06 NOTE — Progress Notes (Signed)
[facility] Telemedicine Visit  Patient consented to have virtual visit. Method of visit: Telephone  Encounter participants: Patient: [name] - located at home Provider: [place] Kaylan Yates - located at [facility]  Others (if applicable): n/a  Chief Complaint: viral URI sx  HPI:  Last Monday 05/27/19 feeling bad with congestion, headache and cough. Tuesday was sick with productive cough and more congestion. No fevers, no SOB. She had COVID test on Sunday that was negative at [facility]. By Sunday night was feeling better, but then felt worse and she made an appointment here but her phone broke on Tuesday. She now has access to a phone. She says she still has a cough and is feeling much better. With her lupus she says when she gets a cold she feels much better. Job is aware of her negative test. She rescheduled her diabetes check up for one day next week. Last night slept throughout the night.   ROS: per HPI  Pertinent PMHx: HTN, T2DM, neuropathy, DDD, SLE, tobacco use disorder  Exam:  Respiratory: able to speak in complete sentences without issue  Assessment/Plan:  Patient with symtpoms concerning for covid-19, negative test on 06/02/19 and sx started 05/27/19. Patient reports cough that is lingering but improved.  Denies SOB, fever, chills, and body aches. Discussed symptomatic treatment - continue Tylenol/ Motrin as needed for discomfort - nasal saline to help with his nasal congestion - Honey for cough -Counseled on wearing a mask, washing hands and avoiding social gatherings; sent note for work via Clinical cytogeneticist -ED precautions discussed and patient expressed good understanding -Patient instructed to avoid others until they meet criteria for ending isolation after any suspected COVID, which are:  -24 hours with no fever (without use of medicaitons) and -respiratory symptoms have improved (e.g. cough, shortness of breath) or  -10 days since symptoms first appeared  Time spent  during visit with patient: 14 minutes  Kimberly Milt Coye, DO PGY-3, [facility]

## 2019-06-19 ENCOUNTER — Ambulatory Visit (INDEPENDENT_AMBULATORY_CARE_PROVIDER_SITE_OTHER): Payer: Medicare Other | Admitting: Family Medicine

## 2019-06-19 ENCOUNTER — Other Ambulatory Visit: Payer: Self-pay

## 2019-06-19 VITALS — BP 140/70 | HR 81 | Wt 210.1 lb

## 2019-06-19 DIAGNOSIS — M79641 Pain in right hand: Secondary | ICD-10-CM | POA: Diagnosis not present

## 2019-06-19 DIAGNOSIS — I10 Essential (primary) hypertension: Secondary | ICD-10-CM | POA: Diagnosis not present

## 2019-06-19 DIAGNOSIS — E1165 Type 2 diabetes mellitus with hyperglycemia: Secondary | ICD-10-CM | POA: Diagnosis not present

## 2019-06-19 LAB — POCT GLYCOSYLATED HEMOGLOBIN (HGB A1C): HbA1c, POC (controlled diabetic range): 8.4 % — AB (ref 0.0–7.0)

## 2019-06-19 MED ORDER — OZEMPIC (0.25 OR 0.5 MG/DOSE) 2 MG/1.5ML ~~LOC~~ SOPN: 0.5000 mg | PEN_INJECTOR | SUBCUTANEOUS | 11 refills | Status: DC

## 2019-06-19 MED ORDER — OZEMPIC (0.25 OR 0.5 MG/DOSE) 2 MG/1.5ML ~~LOC~~ SOPN: 0.5000 mg | PEN_INJECTOR | SUBCUTANEOUS | 0 refills | Status: DC

## 2019-06-19 NOTE — Progress Notes (Signed)
   CHIEF COMPLAINT / HPI:  Right index finger pain: Patient previously evaluated here for right index finger pain after she hurt her hand on a door handle.  She reports that her pain is 75% better.  She reports that she has full range of motion of her finger.  She denies any weakness.  She denies any numbness, tingling.  Diabetes Disease Monitoring: Blood Sugar ranges(Severity) -last CBGs in the 200s at home Medications: Ozempic 0.5 mg injection, Metformin 1000 mg twice daily Compliance (Modifying factor) -patient reports that she ran out of her Ozempic 3 to 4 weeks ago hypoglycemic symptoms-none Patient would benefit from statin therapy Last eye exam: Jan 31st Last foot exam: to perform today ROS: denies dizziness, diaphoresis, LOC, polyuria, polydipsia  Hypertension: -Currently diet controlled - Denies any SOB, CP, vision changes, LE edema, medication SEs, or symptoms of hypotension  PERTINENT  PMH / PSH: HTN, T2DM  OBJECTIVE: BP 140/70   Pulse 81   Wt 210 lb 2 oz (95.3 kg)   SpO2 98%   BMI 38.43 kg/m   General: NAD, pleasant Neck: Supple, no LAD Respiratory: normal work of breathing Neuro: CN II-XII grossly intact Psych: AOx3, appropriate affect  Right wrist/hand: No deformity noted.  Some mild swelling noted in the proximal aspect of first digit. No TTP. FROM with 5/5 strength digits and wrist. NVI distally.  Diabetic Foot Exam - Simple   Simple Foot Form Diabetic Foot exam was performed with the following findings: Yes 06/19/2019  4:50 PM  Visual Inspection No deformities, no ulcerations, no other skin breakdown bilaterally: Yes Sensation Testing Intact to touch and monofilament testing bilaterally: Yes Pulse Check Posterior Tibialis and Dorsalis pulse intact bilaterally: Yes Comments     ASSESSMENT / PLAN:  Right hand pain Previous x-rays did not show any signs of fracture.  Reassuring that pain has improved 75% and she has full range of motion.  Patient is  not very tender on exam.  We will continue with Tylenol, ice and elevation.  Patient to return if pain has not resolved within the next 2 weeks.  Type 2 diabetes mellitus, uncontrolled (HCC) A1c improved from >15 to 8.6.  Patient has been out of Ozempic for 3 to 4 weeks.  Will resume previous dosing at 0.5 mg injections weekly as patient has had good response and is no side effects.  Continue with Metformin.  Will discuss initiating statin.  Foot exam performed today.  Essential hypertension, benign Diet controlled.  Patient to continue lifestyle modifications.  BP 140/70 today.   Health maintenance Patient to send in FIT testing for colon cancer screening and will also reschedule her mammogram  Kimberly Myliyah Rebuck, DO PGY-3, [facility]

## 2019-06-19 NOTE — Patient Instructions (Addendum)
Thank you for coming to see me today. It was a pleasure! Today we talked about:   I will release your results on MyChart.  You may continue to use Ozempic 0.5 mg weekly.  I have provided you with samples.  Please let us know if you need any more in the future.  You are due for your mammogram, please schedule this as soon as you are able. You are also due for regular colon cancer screening with the FIT testing.  Please follow-up with me in 3 months or sooner as needed.  If you have any questions or concerns, please do not hesitate to call the office at [phone].  Take Care,   [place] Chantele Corado, DO

## 2019-06-20 LAB — LIPID PANEL
Chol/HDL Ratio: 4 ratio (ref 0.0–4.4)
Cholesterol, Total: 236 mg/dL — ABNORMAL HIGH (ref 100–199)
HDL: 59 mg/dL (ref >39)
LDL Chol Calc (NIH): 154 mg/dL — ABNORMAL HIGH (ref 0–99)
Triglycerides: 127 mg/dL (ref 0–149)
VLDL Cholesterol Cal: 23 mg/dL (ref 5–40)

## 2019-06-20 LAB — BASIC METABOLIC PANEL
BUN/Creatinine Ratio: 14 (ref 9–23)
BUN: 12 mg/dL (ref 6–24)
CO2: 22 mmol/L (ref 20–29)
Calcium: 9.2 mg/dL (ref 8.7–10.2)
Chloride: 104 mmol/L (ref 96–106)
Creatinine, Ser: 0.85 mg/dL (ref 0.57–1.00)
GFR calc Af Amer: 89 mL/min/{1.73_m2} (ref >59)
GFR calc non Af Amer: 77 mL/min/{1.73_m2} (ref >59)
Glucose: 286 mg/dL — ABNORMAL HIGH (ref 65–99)
Potassium: 4.5 mmol/L (ref 3.5–5.2)
Sodium: 140 mmol/L (ref 134–144)

## 2019-06-21 ENCOUNTER — Encounter: Payer: Self-pay | Admitting: Family Medicine

## 2019-06-21 NOTE — Assessment & Plan Note (Signed)
Diet controlled.  Patient to continue lifestyle modifications.  BP 140/70 today.

## 2019-06-21 NOTE — Assessment & Plan Note (Signed)
Previous x-rays did not show any signs of fracture.  Reassuring that pain has improved 75% and she has full range of motion.  Patient is not very tender on exam.  We will continue with Tylenol, ice and elevation.  Patient to return if pain has not resolved within the next 2 weeks.

## 2019-06-21 NOTE — Assessment & Plan Note (Signed)
A1c improved from >15 to 8.6.  Patient has been out of Ozempic for 3 to 4 weeks.  Will resume previous dosing at 0.5 mg injections weekly as patient has had good response and is no side effects.  Continue with Metformin.  Will discuss initiating statin.  Foot exam performed today.

## 2019-06-27 ENCOUNTER — Other Ambulatory Visit: Payer: Self-pay | Admitting: Family Medicine

## 2019-06-27 MED ORDER — ATORVASTATIN CALCIUM 20 MG PO TABS: 20.0000 mg | ORAL_TABLET | Freq: Every day | ORAL | 3 refills | Status: DC
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
## 2019-07-08 ENCOUNTER — Other Ambulatory Visit: Payer: Self-pay

## 2019-07-08 ENCOUNTER — Ambulatory Visit: Payer: Self-pay

## 2019-07-08 ENCOUNTER — Encounter: Payer: Self-pay | Admitting: Rheumatology

## 2019-07-08 ENCOUNTER — Telehealth: Payer: Self-pay

## 2019-07-08 ENCOUNTER — Ambulatory Visit (INDEPENDENT_AMBULATORY_CARE_PROVIDER_SITE_OTHER): Payer: Medicare Other | Admitting: Rheumatology

## 2019-07-08 VITALS — BP 133/80 | HR 86 | Resp 16 | Ht 62.0 in | Wt 209.0 lb

## 2019-07-08 DIAGNOSIS — M79671 Pain in right foot: Secondary | ICD-10-CM

## 2019-07-08 DIAGNOSIS — M3219 Other organ or system involvement in systemic lupus erythematosus: Secondary | ICD-10-CM | POA: Diagnosis not present

## 2019-07-08 DIAGNOSIS — M79641 Pain in right hand: Secondary | ICD-10-CM | POA: Diagnosis not present

## 2019-07-08 DIAGNOSIS — M79672 Pain in left foot: Secondary | ICD-10-CM | POA: Diagnosis not present

## 2019-07-08 DIAGNOSIS — R5383 Other fatigue: Secondary | ICD-10-CM

## 2019-07-08 DIAGNOSIS — M79642 Pain in left hand: Secondary | ICD-10-CM

## 2019-07-08 DIAGNOSIS — F17200 Nicotine dependence, unspecified, uncomplicated: Secondary | ICD-10-CM

## 2019-07-08 DIAGNOSIS — E1165 Type 2 diabetes mellitus with hyperglycemia: Secondary | ICD-10-CM

## 2019-07-08 DIAGNOSIS — M51369 Other intervertebral disc degeneration, lumbar region without mention of lumbar back pain or lower extremity pain: Secondary | ICD-10-CM

## 2019-07-08 DIAGNOSIS — G894 Chronic pain syndrome: Secondary | ICD-10-CM

## 2019-07-08 DIAGNOSIS — Z79899 Other long term (current) drug therapy: Secondary | ICD-10-CM | POA: Diagnosis not present

## 2019-07-08 DIAGNOSIS — M5136 Other intervertebral disc degeneration, lumbar region: Secondary | ICD-10-CM | POA: Diagnosis not present

## 2019-07-08 DIAGNOSIS — I10 Essential (primary) hypertension: Secondary | ICD-10-CM

## 2019-07-08 NOTE — Telephone Encounter (Signed)
Patient was seen today as a new patient. Per Dr. Corliss Skains, we can refill plaquenil pending lab results (CBC and CMP).  Thanks!

## 2019-07-08 NOTE — Patient Instructions (Addendum)
Standing Labs We placed an order today for your standing lab work.    Please come back and get your standing labs every 3 months.   We have open lab daily Monday through Thursday from 8:30-12:30 PM and 1:30-4:30 PM and Friday from 8:30-12:30 PM and 1:30-4:00 PM at the office of Dr. Pollyann Savoy.   You may experience shorter wait times on Monday and Friday afternoons. The office is located at [address], [address], [place], [place] [address] No appointment is necessary.   Labs are drawn by [facility].  You may receive a bill from [facility] for your lab work.  If you wish to have your labs drawn at another location, please call the office 24 hours in advance to send orders.  If you have any questions regarding directions or hours of operation,  please call [phone].   Just as a reminder please drink plenty of water prior to coming for your lab work. Thanks!     Hydroxychloroquine tablets What is this medicine? HYDROXYCHLOROQUINE (hye drox ee KLOR oh kwin) is used to treat rheumatoid arthritis and systemic lupus erythematosus. It is also used to treat malaria. This medicine may be used for other purposes; ask your health care provider or pharmacist if you have questions. COMMON BRAND NAME(S): Plaquenil, Quineprox What should I tell my health care provider before I take this medicine? They need to know if you have any of these conditions:  diabetes  eye disease, vision problems  G6PD deficiency  heart disease  history of irregular heartbeat  if you often drink alcohol  kidney disease  liver disease  porphyria  psoriasis  an unusual or allergic reaction to chloroquine, hydroxychloroquine, other medicines, foods, dyes, or preservatives  pregnant or trying to get pregnant  breast-feeding How should I use this medicine? Take this medicine by mouth with a glass of water. Follow the directions on the prescription label. Do not cut, crush or chew this medicine.  Swallow the tablets whole. Take this medicine with food. Avoid taking antacids within 4 hours of taking this medicine. It is best to separate these medicines by at least 4 hours. Take your medicine at regular intervals. Do not take it more often than directed. Take all of your medicine as directed even if you think you are better. Do not skip doses or stop your medicine early. Talk to your pediatrician regarding the use of this medicine in children. While this drug may be prescribed for selected conditions, precautions do apply. Overdosage: If you think you have taken too much of this medicine contact a poison control center or emergency room at once. NOTE: This medicine is only for you. Do not share this medicine with others. What if I miss a dose? If you miss a dose, take it as soon as you can. If it is almost time for your next dose, take only that dose. Do not take double or extra doses. What may interact with this medicine? Do not take this medicine with any of the following medications:  cisapride  dronedarone  pimozide  thioridazine This medicine may also interact with the following medications:  ampicillin  antacids  cimetidine  cyclosporine  digoxin  kaolin  medicines for diabetes, like insulin, glipizide, glyburide  medicines for seizures like carbamazepine, phenobarbital, phenytoin  mefloquine  methotrexate  other medicines that prolong the QT interval (cause an abnormal heart rhythm)  praziquantel This list may not describe all possible interactions. Give your health care provider a list of all  the medicines, herbs, non-prescription drugs, or dietary supplements you use. Also tell them if you smoke, drink alcohol, or use illegal drugs. Some items may interact with your medicine. What should I watch for while using this medicine? Visit your health care professional for regular checks on your progress. Tell your health care professional if your symptoms do not  start to get better or if they get worse. You may need blood work done while you are taking this medicine. If you take other medicines that can affect heart rhythm, you may need more testing. Talk to your health care professional if you have questions. Your vision may be tested before and during use of this medicine. Tell your health care professional right away if you have any change in your eyesight. What side effects may I notice from receiving this medicine? Side effects that you should report to your doctor or health care professional as soon as possible:  allergic reactions like skin rash, itching or hives, swelling of the face, lips, or tongue  changes in vision  decreased hearing or ringing of the ears  muscle weakness  redness, blistering, peeling or loosening of the skin, including inside the mouth  sensitivity to light  signs and symptoms of a dangerous change in heartbeat or heart rhythm like chest pain; dizziness; fast or irregular heartbeat; palpitations; feeling faint or lightheaded, falls; breathing problems  signs and symptoms of liver injury like dark yellow or brown urine; general ill feeling or flu-like symptoms; light-colored stools; loss of appetite; nausea; right upper belly pain; unusually weak or tired; yellowing of the eyes or skin  signs and symptoms of low blood sugar such as feeling anxious; confusion; dizziness; increased hunger; unusually weak or tired; sweating; shakiness; cold; irritable; headache; blurred vision; fast heartbeat; loss of consciousness  suicidal thoughts  uncontrollable head, mouth, neck, arm, or leg movements Side effects that usually do not require medical attention (report to your doctor or health care professional if they continue or are bothersome):  diarrhea  dizziness  hair loss  headache  irritable  loss of appetite  nausea, vomiting  stomach pain This list may not describe all possible side effects. Call your doctor  for medical advice about side effects. You may report side effects to FDA at 1-800-FDA-1088. Where should I keep my medicine? Keep out of the reach of children. Store at room temperature between 15 and 30 degrees C (59 and 86 degrees F). Protect from moisture and light. Throw away any unused medicine after the expiration date. NOTE: This sheet is a summary. It may not cover all possible information. If you have questions about this medicine, talk to your doctor, pharmacist, or health care provider.  2020 Elsevier/Gold Standard (2018-08-20 12:56:32)

## 2019-07-09 MED ORDER — HYDROXYCHLOROQUINE SULFATE 200 MG PO TABS: 200.0000 mg | ORAL_TABLET | Freq: Two times a day (BID) | ORAL | 0 refills | Status: DC

## 2019-07-09 NOTE — Telephone Encounter (Signed)
Patient's CBC/CMP resulted and prescription sent to the pharmacy.

## 2019-07-09 NOTE — Progress Notes (Signed)
Glucose is elevated.  All other labs are still pending.  Please notify patient.

## 2019-07-10 NOTE — Progress Notes (Signed)
I will discuss results at the follow-up visit.

## 2019-07-11 LAB — PROTEIN ELECTROPHORESIS, SERUM, WITH REFLEX
Albumin ELP: 4 g/dL (ref 3.8–4.8)
Alpha 1: 0.3 g/dL (ref 0.2–0.3)
Alpha 2: 0.9 g/dL (ref 0.5–0.9)
Beta 2: 0.4 g/dL (ref 0.2–0.5)
Beta Globulin: 0.5 g/dL (ref 0.4–0.6)
Gamma Globulin: 0.9 g/dL (ref 0.8–1.7)
Total Protein: 7 g/dL (ref 6.1–8.1)

## 2019-07-11 LAB — SJOGRENS SYNDROME-B EXTRACTABLE NUCLEAR ANTIBODY: SSB (La) (ENA) Antibody, IgG: <1 AI

## 2019-07-11 LAB — CBC WITH DIFFERENTIAL/PLATELET
Absolute Monocytes: 535 cells/uL (ref 200–950)
Basophils Absolute: 32 cells/uL (ref 0–200)
Basophils Relative: 0.4 %
Eosinophils Absolute: 219 cells/uL (ref 15–500)
Eosinophils Relative: 2.7 %
HCT: 46 % — ABNORMAL HIGH (ref 35.0–45.0)
Hemoglobin: 15.7 g/dL — ABNORMAL HIGH (ref 11.7–15.5)
Lymphs Abs: 2009 cells/uL (ref 850–3900)
MCH: 28.9 pg (ref 27.0–33.0)
MCHC: 34.1 g/dL (ref 32.0–36.0)
MCV: 84.6 fL (ref 80.0–100.0)
MPV: 10.7 fL (ref 7.5–12.5)
Monocytes Relative: 6.6 %
Neutro Abs: 5306 cells/uL (ref 1500–7800)
Neutrophils Relative %: 65.5 %
Platelets: 214 10*3/uL (ref 140–400)
RBC: 5.44 10*6/uL — ABNORMAL HIGH (ref 3.80–5.10)
RDW: 12.8 % (ref 11.0–15.0)
Total Lymphocyte: 24.8 %
WBC: 8.1 10*3/uL (ref 3.8–10.8)

## 2019-07-11 LAB — BETA-2 GLYCOPROTEIN ANTIBODIES
Beta-2 Glyco 1 IgA: <9 SAU (ref <=20)
Beta-2 Glyco 1 IgM: 9 SMU (ref <=20)
Beta-2 Glyco I IgG: <9 SGU (ref <=20)

## 2019-07-11 LAB — COMPLETE METABOLIC PANEL WITH GFR
AG Ratio: 1.4 (calc) (ref 1.0–2.5)
ALT: 9 U/L (ref 6–29)
AST: 12 U/L (ref 10–35)
Albumin: 3.8 g/dL (ref 3.6–5.1)
Alkaline phosphatase (APISO): 66 U/L (ref 37–153)
BUN: 12 mg/dL (ref 7–25)
CO2: 25 mmol/L (ref 20–32)
Calcium: 9.1 mg/dL (ref 8.6–10.4)
Chloride: 107 mmol/L (ref 98–110)
Creat: 0.81 mg/dL (ref 0.50–1.05)
GFR, Est African American: 95 mL/min/{1.73_m2} (ref >=60)
GFR, Est Non African American: 82 mL/min/{1.73_m2} (ref >=60)
Globulin: 2.7 g/dL (calc) (ref 1.9–3.7)
Glucose, Bld: 165 mg/dL — ABNORMAL HIGH (ref 65–99)
Potassium: 4 mmol/L (ref 3.5–5.3)
Sodium: 135 mmol/L (ref 135–146)
Total Bilirubin: 0.3 mg/dL (ref 0.2–1.2)
Total Protein: 6.5 g/dL (ref 6.1–8.1)

## 2019-07-11 LAB — URINALYSIS, ROUTINE W REFLEX MICROSCOPIC
Bilirubin Urine: NEGATIVE
Hgb urine dipstick: NEGATIVE
Leukocytes,Ua: NEGATIVE
Nitrite: NEGATIVE
Protein, ur: NEGATIVE
Specific Gravity, Urine: 1.035 (ref 1.001–1.03)
pH: <=5 (ref 5.0–8.0)

## 2019-07-11 LAB — C3 AND C4
C3 Complement: 143 mg/dL (ref 83–193)
C4 Complement: 50 mg/dL (ref 15–57)

## 2019-07-11 LAB — RFX DRVVT 1:1 MIX

## 2019-07-11 LAB — CK: Total CK: 44 U/L (ref 29–143)

## 2019-07-11 LAB — CARDIOLIPIN ANTIBODIES, IGG, IGM, IGA
Anticardiolipin IgA: <11 [APL'U]
Anticardiolipin IgG: <14 [GPL'U]
Anticardiolipin IgM: <12 [MPL'U]

## 2019-07-11 LAB — LUPUS ANTICOAGULANT EVAL W/ REFLEX
PTT-LA Screen: 38 s (ref <=40)
dRVVT: 51 s — ABNORMAL HIGH (ref <=45)

## 2019-07-11 LAB — RNP ANTIBODY: Ribonucleic Protein(ENA) Antibody, IgG: <1 AI

## 2019-07-11 LAB — ANTI-NUCLEAR AB-TITER (ANA TITER): ANA Titer 1: 1:40 {titer} — ABNORMAL HIGH

## 2019-07-11 LAB — SEDIMENTATION RATE: Sed Rate: 11 mm/h (ref 0–30)

## 2019-07-11 LAB — HEPATITIS B SURFACE ANTIGEN: Hepatitis B Surface Ag: NONREACTIVE

## 2019-07-11 LAB — RFLX DRVVT CONFRIM: DRVVT CONFIRM: POSITIVE — AB

## 2019-07-11 LAB — ANTI-SMITH ANTIBODY: ENA SM Ab Ser-aCnc: <1 AI

## 2019-07-11 LAB — IFE INTERPRETATION: Immunofix Electr Int: NOT DETECTED

## 2019-07-11 LAB — RHEUMATOID FACTOR: Rheumatoid fact SerPl-aCnc: 23 IU/mL — ABNORMAL HIGH (ref <14)

## 2019-07-11 LAB — HEPATITIS B CORE ANTIBODY, IGM: Hep B C IgM: NONREACTIVE

## 2019-07-11 LAB — CYCLIC CITRUL PEPTIDE ANTIBODY, IGG: Cyclic Citrullin Peptide Ab: 18 UNITS

## 2019-07-11 LAB — SJOGRENS SYNDROME-A EXTRACTABLE NUCLEAR ANTIBODY: SSA (Ro) (ENA) Antibody, IgG: <1 AI

## 2019-07-11 LAB — ANTI-SCLERODERMA ANTIBODY: Scleroderma (Scl-70) (ENA) Antibody, IgG: <1 AI

## 2019-07-11 LAB — ANTI-DNA ANTIBODY, DOUBLE-STRANDED: ds DNA Ab: 1 IU/mL

## 2019-07-11 LAB — TSH: TSH: 0.66 mIU/L

## 2019-07-11 LAB — ANA: Anti Nuclear Antibody (ANA): POSITIVE — AB

## 2019-07-29 ENCOUNTER — Other Ambulatory Visit: Payer: Self-pay | Admitting: Physician Assistant

## 2019-07-29 DIAGNOSIS — Z79899 Other long term (current) drug therapy: Secondary | ICD-10-CM
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
## 2019-07-30 LAB — CBC WITH DIFFERENTIAL/PLATELET
Absolute Monocytes: 525 cells/uL (ref 200–950)
Basophils Absolute: 27 cells/uL (ref 0–200)
Basophils Relative: 0.3 %
Eosinophils Absolute: 89 cells/uL (ref 15–500)
Eosinophils Relative: 1 %
HCT: 47.8 % — ABNORMAL HIGH (ref 35.0–45.0)
Hemoglobin: 15.9 g/dL — ABNORMAL HIGH (ref 11.7–15.5)
Lymphs Abs: 2278 cells/uL (ref 850–3900)
MCH: 28.3 pg (ref 27.0–33.0)
MCHC: 33.3 g/dL (ref 32.0–36.0)
MCV: 85.1 fL (ref 80.0–100.0)
MPV: 10.3 fL (ref 7.5–12.5)
Monocytes Relative: 5.9 %
Neutro Abs: 5981 cells/uL (ref 1500–7800)
Neutrophils Relative %: 67.2 %
Platelets: 247 10*3/uL (ref 140–400)
RBC: 5.62 10*6/uL — ABNORMAL HIGH (ref 3.80–5.10)
RDW: 12.5 % (ref 11.0–15.0)
Total Lymphocyte: 25.6 %
WBC: 8.9 10*3/uL (ref 3.8–10.8)

## 2019-07-30 LAB — COMPLETE METABOLIC PANEL WITH GFR
AG Ratio: 1.4 (calc) (ref 1.0–2.5)
ALT: 13 U/L (ref 6–29)
AST: 14 U/L (ref 10–35)
Albumin: 4 g/dL (ref 3.6–5.1)
Alkaline phosphatase (APISO): 70 U/L (ref 37–153)
BUN: 11 mg/dL (ref 7–25)
CO2: 21 mmol/L (ref 20–32)
Calcium: 9 mg/dL (ref 8.6–10.4)
Chloride: 106 mmol/L (ref 98–110)
Creat: 0.83 mg/dL (ref 0.50–1.05)
GFR, Est African American: 92 mL/min/{1.73_m2} (ref >=60)
GFR, Est Non African American: 79 mL/min/{1.73_m2} (ref >=60)
Globulin: 2.8 g/dL (calc) (ref 1.9–3.7)
Glucose, Bld: 191 mg/dL — ABNORMAL HIGH (ref 65–99)
Potassium: 4.7 mmol/L (ref 3.5–5.3)
Sodium: 140 mmol/L (ref 135–146)
Total Bilirubin: 0.3 mg/dL (ref 0.2–1.2)
Total Protein: 6.8 g/dL (ref 6.1–8.1)

## 2019-07-30 NOTE — Progress Notes (Signed)
Glucose is elevated-191. Rest of CMP WNL.  RBC count, Hgb, and hct are elevated but stable. Rest of CBC WNL.

## 2019-08-01 ENCOUNTER — Encounter: Payer: Self-pay | Admitting: Rheumatology

## 2019-08-01 ENCOUNTER — Other Ambulatory Visit: Payer: Self-pay

## 2019-08-01 ENCOUNTER — Ambulatory Visit (INDEPENDENT_AMBULATORY_CARE_PROVIDER_SITE_OTHER): Payer: Medicare Other | Admitting: Rheumatology

## 2019-08-01 VITALS — BP 140/85 | HR 77 | Resp 18 | Ht 62.5 in | Wt 205.6 lb

## 2019-08-01 DIAGNOSIS — M19041 Primary osteoarthritis, right hand: Secondary | ICD-10-CM

## 2019-08-01 DIAGNOSIS — M3219 Other organ or system involvement in systemic lupus erythematosus: Secondary | ICD-10-CM

## 2019-08-01 DIAGNOSIS — M19071 Primary osteoarthritis, right ankle and foot: Secondary | ICD-10-CM

## 2019-08-01 DIAGNOSIS — M5136 Other intervertebral disc degeneration, lumbar region: Secondary | ICD-10-CM | POA: Diagnosis not present

## 2019-08-01 DIAGNOSIS — M19072 Primary osteoarthritis, left ankle and foot: Secondary | ICD-10-CM

## 2019-08-01 DIAGNOSIS — G894 Chronic pain syndrome: Secondary | ICD-10-CM

## 2019-08-01 DIAGNOSIS — Z79899 Other long term (current) drug therapy: Secondary | ICD-10-CM | POA: Diagnosis not present

## 2019-08-01 DIAGNOSIS — I10 Essential (primary) hypertension: Secondary | ICD-10-CM

## 2019-08-01 DIAGNOSIS — E1165 Type 2 diabetes mellitus with hyperglycemia: Secondary | ICD-10-CM

## 2019-08-01 DIAGNOSIS — M19042 Primary osteoarthritis, left hand: Secondary | ICD-10-CM

## 2019-08-01 DIAGNOSIS — R5383 Other fatigue: Secondary | ICD-10-CM

## 2019-08-01 DIAGNOSIS — F17200 Nicotine dependence, unspecified, uncomplicated: Secondary | ICD-10-CM

## 2019-08-01 NOTE — Patient Instructions (Signed)
Standing Labs We placed an order today for your standing lab work.    Please come back and get your standing labs in 1 month, 3 months, then every 5 months   We have open lab daily Monday through Thursday from 8:30-12:30 PM and 1:30-4:30 PM and Friday from 8:30-12:30 PM and 1:30-4:00 PM at the office of Dr. Pollyann Savoy.   You may experience shorter wait times on Monday and Friday afternoons. The office is located at [address], [place], [place] [address] No appointment is necessary.   Labs are drawn by [facility].  You may receive a bill from [facility] for your lab work.  If you wish to have your labs drawn at another location, please call the office 24 hours in advance to send orders.  If you have any questions regarding directions or hours of operation,  please call [phone].   Just as a reminder please drink plenty of water prior to coming for your lab work. Thanks!

## 2019-09-24 ENCOUNTER — Other Ambulatory Visit: Payer: Self-pay | Admitting: Family Medicine

## 2019-10-31 ENCOUNTER — Ambulatory Visit: Payer: Medicare Other | Admitting: Rheumatology
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
## 2019-11-21 ENCOUNTER — Ambulatory Visit (INDEPENDENT_AMBULATORY_CARE_PROVIDER_SITE_OTHER): Payer: Medicare Other | Admitting: Rheumatology

## 2019-11-21 ENCOUNTER — Encounter: Payer: Self-pay | Admitting: Rheumatology

## 2019-11-21 ENCOUNTER — Other Ambulatory Visit: Payer: Self-pay

## 2019-11-21 VITALS — BP 163/88 | HR 77 | Resp 17 | Ht 62.5 in | Wt 205.0 lb

## 2019-11-21 DIAGNOSIS — M19041 Primary osteoarthritis, right hand: Secondary | ICD-10-CM

## 2019-11-21 DIAGNOSIS — M25561 Pain in right knee: Secondary | ICD-10-CM | POA: Diagnosis not present

## 2019-11-21 DIAGNOSIS — M19042 Primary osteoarthritis, left hand: Secondary | ICD-10-CM

## 2019-11-21 DIAGNOSIS — M3219 Other organ or system involvement in systemic lupus erythematosus: Secondary | ICD-10-CM

## 2019-11-21 DIAGNOSIS — G8929 Other chronic pain: Secondary | ICD-10-CM

## 2019-11-21 DIAGNOSIS — I10 Essential (primary) hypertension: Secondary | ICD-10-CM

## 2019-11-21 DIAGNOSIS — E1165 Type 2 diabetes mellitus with hyperglycemia: Secondary | ICD-10-CM

## 2019-11-21 DIAGNOSIS — M19071 Primary osteoarthritis, right ankle and foot: Secondary | ICD-10-CM

## 2019-11-21 DIAGNOSIS — Z79899 Other long term (current) drug therapy: Secondary | ICD-10-CM

## 2019-11-21 DIAGNOSIS — M25562 Pain in left knee: Secondary | ICD-10-CM

## 2019-11-21 DIAGNOSIS — M5136 Other intervertebral disc degeneration, lumbar region: Secondary | ICD-10-CM

## 2019-11-21 DIAGNOSIS — G894 Chronic pain syndrome: Secondary | ICD-10-CM

## 2019-11-21 DIAGNOSIS — R5383 Other fatigue: Secondary | ICD-10-CM

## 2019-11-21 DIAGNOSIS — M19072 Primary osteoarthritis, left ankle and foot: Secondary | ICD-10-CM

## 2019-11-21 DIAGNOSIS — Z13820 Encounter for screening for osteoporosis: Secondary | ICD-10-CM

## 2019-11-21 DIAGNOSIS — F17200 Nicotine dependence, unspecified, uncomplicated: Secondary | ICD-10-CM

## 2019-11-21 MED ORDER — HYDROXYCHLOROQUINE SULFATE 200 MG PO TABS: ORAL_TABLET | ORAL | 0 refills | Status: DC

## 2019-11-21 NOTE — Patient Instructions (Addendum)
Journal for Nurse Practitioners, 15(4), 263-267. Retrieved January 29, 2018 from http://clinicalkey.com/nursing">  Knee Exercises Ask your health care provider which exercises are safe for you. Do exercises exactly as told by your health care provider and adjust them as directed. It is normal to feel mild stretching, pulling, tightness, or discomfort as you do these exercises. Stop right away if you feel sudden pain or your pain gets worse. Do not begin these exercises until told by your health care provider. Stretching and range-of-motion exercises These exercises warm up your muscles and joints and improve the movement and flexibility of your knee. These exercises also help to relieve pain and swelling. Knee extension, prone 1. Lie on your abdomen (prone position) on a bed. 2. Place your left / right knee just beyond the edge of the surface so your knee is not on the bed. You can put a towel under your left / right thigh just above your kneecap for comfort. 3. Relax your leg muscles and allow gravity to straighten your knee (extension). You should feel a stretch behind your left / right knee. 4. Hold this position for __________ seconds. 5. Scoot up so your knee is supported between repetitions. Repeat __________ times. Complete this exercise __________ times a day. Knee flexion, active  1. Lie on your back with both legs straight. If this causes back discomfort, bend your left / right knee so your foot is flat on the floor. 2. Slowly slide your left / right heel back toward your buttocks. Stop when you feel a gentle stretch in the front of your knee or thigh (flexion). 3. Hold this position for __________ seconds. 4. Slowly slide your left / right heel back to the starting position. Repeat __________ times. Complete this exercise __________ times a day. Quadriceps stretch, prone  1. Lie on your abdomen on a firm surface, such as a bed or padded floor. 2. Bend your left / right knee and hold  your ankle. If you cannot reach your ankle or pant leg, loop a belt around your foot and grab the belt instead. 3. Gently pull your heel toward your buttocks. Your knee should not slide out to the side. You should feel a stretch in the front of your thigh and knee (quadriceps). 4. Hold this position for __________ seconds. Repeat __________ times. Complete this exercise __________ times a day. Hamstring, supine 1. Lie on your back (supine position). 2. Loop a belt or towel over the ball of your left / right foot. The ball of your foot is on the walking surface, right under your toes. 3. Straighten your left / right knee and slowly pull on the belt to raise your leg until you feel a gentle stretch behind your knee (hamstring). ? Do not let your knee bend while you do this. ? Keep your other leg flat on the floor. 4. Hold this position for __________ seconds. Repeat __________ times. Complete this exercise __________ times a day. Strengthening exercises These exercises build strength and endurance in your knee. Endurance is the ability to use your muscles for a long time, even after they get tired. Quadriceps, isometric This exercise stretches the muscles in front of your thigh (quadriceps) without moving your knee joint (isometric). 1. Lie on your back with your left / right leg extended and your other knee bent. Put a rolled towel or small pillow under your knee if told by your health care provider. 2. Slowly tense the muscles in the front of your left /   right thigh. You should see your kneecap slide up toward your hip or see increased dimpling just above the knee. This motion will push the back of the knee toward the floor. 3. For __________ seconds, hold the muscle as tight as you can without increasing your pain. 4. Relax the muscles slowly and completely. Repeat __________ times. Complete this exercise __________ times a day. Straight leg raises This exercise stretches the muscles in front  of your thigh (quadriceps) and the muscles that move your hips (hip flexors). 1. Lie on your back with your left / right leg extended and your other knee bent. 2. Tense the muscles in the front of your left / right thigh. You should see your kneecap slide up or see increased dimpling just above the knee. Your thigh may even shake a bit. 3. Keep these muscles tight as you raise your leg 4-6 inches (10-15 cm) off the floor. Do not let your knee bend. 4. Hold this position for __________ seconds. 5. Keep these muscles tense as you lower your leg. 6. Relax your muscles slowly and completely after each repetition. Repeat __________ times. Complete this exercise __________ times a day. Hamstring, isometric 1. Lie on your back on a firm surface. 2. Bend your left / right knee about __________ degrees. 3. Dig your left / right heel into the surface as if you are trying to pull it toward your buttocks. Tighten the muscles in the back of your thighs (hamstring) to "dig" as hard as you can without increasing any pain. 4. Hold this position for __________ seconds. 5. Release the tension gradually and allow your muscles to relax completely for __________ seconds after each repetition. Repeat __________ times. Complete this exercise __________ times a day. Hamstring curls If told by your health care provider, do this exercise while wearing ankle weights. Begin with __________ lb weights. Then increase the weight by 1 lb (0.5 kg) increments. Do not wear ankle weights that are more than __________ lb. 1. Lie on your abdomen with your legs straight. 2. Bend your left / right knee as far as you can without feeling pain. Keep your hips flat against the floor. 3. Hold this position for __________ seconds. 4. Slowly lower your leg to the starting position. Repeat __________ times. Complete this exercise __________ times a day. Squats This exercise strengthens the muscles in front of your thigh and knee  (quadriceps). 1. Stand in front of a table, with your feet and knees pointing straight ahead. You may rest your hands on the table for balance but not for support. 2. Slowly bend your knees and lower your hips like you are going to sit in a chair. ? Keep your weight over your heels, not over your toes. ? Keep your lower legs upright so they are parallel with the table legs. ? Do not let your hips go lower than your knees. ? Do not bend lower than told by your health care provider. ? If your knee pain increases, do not bend as low. 3. Hold the squat position for __________ seconds. 4. Slowly push with your legs to return to standing. Do not use your hands to pull yourself to standing. Repeat __________ times. Complete this exercise __________ times a day. Wall slides This exercise strengthens the muscles in front of your thigh and knee (quadriceps). 1. Lean your back against a smooth wall or door, and walk your feet out 18-24 inches (46-61 cm) from it. 2. Place your feet hip-width apart. 3.   Slowly slide down the wall or door until your knees bend __________ degrees. Keep your knees over your heels, not over your toes. Keep your knees in line with your hips. 4. Hold this position for __________ seconds. Repeat __________ times. Complete this exercise __________ times a day. Straight leg raises This exercise strengthens the muscles that rotate the leg at the hip and move it away from your body (hip abductors). 1. Lie on your side with your left / right leg in the top position. Lie so your head, shoulder, knee, and hip line up. You may bend your bottom knee to help you keep your balance. 2. Roll your hips slightly forward so your hips are stacked directly over each other and your left / right knee is facing forward. 3. Leading with your heel, lift your top leg 4-6 inches (10-15 cm). You should feel the muscles in your outer hip lifting. ? Do not let your foot drift forward. ? Do not let your knee  roll toward the ceiling. 4. Hold this position for __________ seconds. 5. Slowly return your leg to the starting position. 6. Let your muscles relax completely after each repetition. Repeat __________ times. Complete this exercise __________ times a day. Straight leg raises This exercise stretches the muscles that move your hips away from the front of the pelvis (hip extensors). 1. Lie on your abdomen on a firm surface. You can put a pillow under your hips if that is more comfortable. 2. Tense the muscles in your buttocks and lift your left / right leg about 4-6 inches (10-15 cm). Keep your knee straight as you lift your leg. 3. Hold this position for __________ seconds. 4. Slowly lower your leg to the starting position. 5. Let your leg relax completely after each repetition. Repeat __________ times. Complete this exercise __________ times a day. This information is not intended to replace advice given to you by your health care provider. Make sure you discuss any questions you have with your health care provider. Document Revised: 01/30/2018 Document Reviewed: 01/30/2018 Elsevier Patient Education  2020 [employer].  Vaccines You are taking a medication(s) that can suppress your immune system.  The following immunizations are recommended: . Flu annually . Covid-19  o Please continue to wear your mask, practice social distancing and handwashing if you are on any medications that suppress your immune system or if you have autoimmune disease even if you are fully vaccinated. o If you are on methotrexate, Cellcept (mycophenolate), Rinvoq, Harriette Ohara, and Olumiant - Hold medication for 1 week after each vaccine dose o If you are on Orencia Subcutaneous injections - Hold medication both one week prior to and one week after the first vaccine dose (only) o If you are on Orencia IV infusions - Time vaccine administration so that the first vaccination will occur four weeks after  infusion . Pneumonia (Pneumovax 23 and Prevnar 13 spaced at least 1 year apart) . Shingrix (after age 61)  Please check with your PCP to make sure you are up to date.  If you develop COVID-19 infection please contact your primary care physician's office or our office to check if you need eligibility for antibody infusion.  Standing Labs We placed an order today for your standing lab work.   Please have your standing labs drawn in September  If possible, please have your labs drawn 2 weeks prior to your appointment so that the provider can discuss your results at your appointment.  We have open lab daily  Monday through Thursday from 8:30-12:30 PM and 1:30-4:30 PM and Friday from 8:30-12:30 PM and 1:30-4:00 PM at the office of Dr. Pollyann Savoy, [facility].   Please be advised, patients with office appointments requiring lab work will take precedents over walk-in lab work.  If possible, please come for your lab work on Monday and Friday afternoons, as you may experience shorter wait times. The office is located at [address], [address], [place], [place] [address] No appointment is necessary.   Labs are drawn by [facility]. Please bring your co-pay at the time of your lab draw.  You may receive a bill from [facility] for your lab work.  If you wish to have your labs drawn at another location, please call the office 24 hours in advance to send orders.  If you have any questions regarding directions or hours of operation,  please call [phone].   As a reminder, please drink plenty of water prior to coming for your lab work. Thanks!

## 2019-12-06 ENCOUNTER — Telehealth: Payer: Self-pay

## 2019-12-06 NOTE — Telephone Encounter (Signed)
Patient calls nurse line regarding receiving medication for yeast infection. Patient reports that she has a history of recurrent yeast infections and would like medication to be sent into [facility] on [address] if appropriate.   To PCP  Veronda Prude, RN

## 2019-12-07 MED ORDER — FLUCONAZOLE 150 MG PO TABS: 150.0000 mg | ORAL_TABLET | Freq: Once | ORAL | 0 refills | Status: AC

## 2019-12-07 NOTE — Telephone Encounter (Signed)
Contacted patient in order to discuss symptoms for which she is requesting Diflucan.  Patient reports that she was out of her diabetes medications for close to 7 days prior to being able to pick up her prescription.  She states the her symptoms are similar to prior episodes with yeast infections including vaginal pruritus and burning as well as yeastlike discharge.  Sent refill for 2 Diflucan tablets.

## 2020-01-24 ENCOUNTER — Other Ambulatory Visit: Payer: Self-pay | Admitting: *Deleted

## 2020-01-24 MED ORDER — METFORMIN HCL ER 500 MG PO TB24: 500.0000 mg | ORAL_TABLET | Freq: Every day | ORAL | 3 refills | Status: DC

## 2020-01-27 ENCOUNTER — Other Ambulatory Visit: Payer: Self-pay | Admitting: *Deleted

## 2020-01-27 MED ORDER — OZEMPIC (0.25 OR 0.5 MG/DOSE) 2 MG/1.5ML ~~LOC~~ SOPN: 0.5000 mg | PEN_INJECTOR | SUBCUTANEOUS | 1 refills | Status: DC

## 2020-01-27 NOTE — Telephone Encounter (Signed)
Pt requesting a 90 day supply of ozempic. Please advise. Majesty Oehlert Bruna Potter, CMA

## 2020-03-17 ENCOUNTER — Other Ambulatory Visit: Payer: Self-pay

## 2020-03-17 MED ORDER — ONETOUCH VERIO VI STRP: ORAL_STRIP | 2 refills | Status: DC

## 2020-03-17 NOTE — Telephone Encounter (Signed)
Patient calls nurse line stating she pick up her test trips, however the packaging was broken. Patient reports she tried to take it back to the pharmacy, however they would not take. Patient reports she does not want to use test strip that have been opened. Pharmacy informed her to get another refill. Informed patient we can try for another refill, however insurance may not pay. Please advise.

## 2020-03-30 ENCOUNTER — Ambulatory Visit (INDEPENDENT_AMBULATORY_CARE_PROVIDER_SITE_OTHER): Payer: Medicare Other | Admitting: Family Medicine

## 2020-03-30 ENCOUNTER — Other Ambulatory Visit (HOSPITAL_COMMUNITY)
Admission: RE | Admit: 2020-03-30 | Discharge: 2020-03-30 | Disposition: A | Discharge status: Home / Self Care | Payer: Medicare Other | Source: Ambulatory Visit | Attending: Family Medicine | Admitting: Family Medicine

## 2020-03-30 ENCOUNTER — Other Ambulatory Visit: Payer: Self-pay

## 2020-03-30 ENCOUNTER — Encounter: Payer: Self-pay | Admitting: Family Medicine

## 2020-03-30 VITALS — BP 128/82 | HR 90 | Wt 206.2 lb

## 2020-03-30 DIAGNOSIS — E1165 Type 2 diabetes mellitus with hyperglycemia: Secondary | ICD-10-CM | POA: Diagnosis not present

## 2020-03-30 DIAGNOSIS — Z1231 Encounter for screening mammogram for malignant neoplasm of breast: Secondary | ICD-10-CM

## 2020-03-30 DIAGNOSIS — Z124 Encounter for screening for malignant neoplasm of cervix: Secondary | ICD-10-CM

## 2020-03-30 DIAGNOSIS — Z1151 Encounter for screening for human papillomavirus (HPV): Secondary | ICD-10-CM | POA: Diagnosis not present

## 2020-03-30 DIAGNOSIS — Z8619 Personal history of other infectious and parasitic diseases: Secondary | ICD-10-CM

## 2020-03-30 DIAGNOSIS — L84 Corns and callosities: Secondary | ICD-10-CM

## 2020-03-30 LAB — POCT GLYCOSYLATED HEMOGLOBIN (HGB A1C): Hemoglobin A1C: 7.6 % — AB (ref 4.0–5.6)

## 2020-03-30 LAB — POCT WET PREP (WET MOUNT)
Clue Cells Wet Prep Whiff POC: NEGATIVE
Trichomonas Wet Prep HPF POC: ABSENT

## 2020-03-30 MED ORDER — OZEMPIC (0.25 OR 0.5 MG/DOSE) 2 MG/1.5ML ~~LOC~~ SOPN: 0.5000 mg | PEN_INJECTOR | SUBCUTANEOUS | 1 refills | Status: AC

## 2020-03-30 MED ORDER — ONETOUCH VERIO VI STRP: ORAL_STRIP | 2 refills | Status: DC

## 2020-03-30 MED ORDER — ONETOUCH DELICA LANCETS 33G MISC: 1.0000 [IU] | Freq: Every day | 2 refills | Status: DC

## 2020-03-30 MED ORDER — ONETOUCH DELICA LANCING DEV MISC: 1.0000 [IU] | Freq: Every day | 2 refills | Status: DC

## 2020-03-30 MED ORDER — ONETOUCH VERIO REFLECT W/DEVICE KIT: 1.0000 | PACK | 0 refills | Status: DC

## 2020-03-30 NOTE — Progress Notes (Signed)
Medication Samples have been provided to the patient.  Drug name: Ozempic       Strength: 2mg         Qty: 2  LOT: [id number]  Exp.Date: 01/2022    The patient has been instructed regarding the correct time, dose, and frequency of taking this medication, including desired effects and most common side effects.   Delainey Winstanley C Kennah Hehr 2:40 PM 03/30/2020

## 2020-03-30 NOTE — Patient Instructions (Signed)
It was wonderful to see you today.  Please bring ALL of your medications with you to every visit.   Today we talked about:  Your diabetes which has improved. Continue ozempic and metformin. Follow up with your PCP if any issues with restarting ozempic.   I will call you with the results of your wet prep if abnormal.   I have referred you to a podiatrist for your foot callus. You should receive a call in a 1 week.   Please be sure to schedule follow up with your PCP for other health maintenance issues at the front  desk before you leave today.   Please call the clinic at [phone] if your symptoms worsen or you have any concerns. It was our pleasure to serve you.  Dr. Salvadore Dom

## 2020-03-30 NOTE — Progress Notes (Signed)
    SUBJECTIVE:   CHIEF COMPLAINT / HPI:   Ms. [name] is a 54 yo F who presents for the issues below.   Diabetes Current Regimen: metformin 500 mg daily, semaglutide 0.5 mg weekly CBGs: 120-130s checks daily; recently out of supplies to check blood sugar Last A1c: 8.4 on 06/19/19  Denies polyuria, polydipsia, hypoglycemia Last Eye Exam: 2018 Statin: Lipitor 20 mg ACE/ARB: N/a   Vaginal discharge/Need for PAP Has a hx of trichomonas and feels as if her discharge is abnormal appearing. Would like to be checked for trichomonas. Is sexually active with same partner that was positive for trichomonas. Agreed to pap today. Does not want additional STD testing.  Painful foot callus Chronic right foot callus that is now painful and would like to see podiatry. Denies trauma to the area and endorses intact sensation.   HM Needs mammogram- will call breast center  PERTINENT  PMH / PSH: HTN (no currently on medications; consider ACE/ARB when antihypertensive medication is needed), SLE (taking plaquenil)  OBJECTIVE:   BP 128/82   Pulse 90   Wt 206 lb 3.2 oz (93.5 kg)   SpO2 98%   BMI 37.11 kg/m   General: Appears well, no acute distress. Age appropriate. Cardiac: RRR, normal heart sounds, no murmurs Respiratory: CTAB, normal effort Extremities: Right foot with callus on medial side that is tender to palpation. Non infection appear but is partly fluctuant. Pelvic exam: normal external genitalia, vulva, vagina, cervix, uterus and adnexa, PAP: Pap smear done today, WET MOUNT done - results: excessive bacteria, exam chaperoned by [name], CMA. Results for orders placed or performed in visit on 03/30/20 (from the past 72 hour(s))  HgB A1c     Status: Abnormal   Collection Time: 03/30/20  1:49 PM  Result Value Ref Range   Hemoglobin A1C 7.6 (A) 4.0 - 5.6 %   HbA1c POC (<> result, manual entry)     HbA1c, POC (prediabetic range)     HbA1c, POC (controlled diabetic range)    POCT Wet  Prep [name] [place])     Status: Abnormal   Collection Time: 03/30/20  2:50 PM  Result Value Ref Range   Source Wet Prep POC VAG    WBC, Wet Prep HPF POC 5-10    Bacteria Wet Prep HPF POC Many (A) Few   Clue Cells Wet Prep HPF POC None None   Clue Cells Wet Prep Whiff POC Negative Whiff    Yeast Wet Prep HPF POC None None   KOH Wet Prep POC None None   Trichomonas Wet Prep HPF POC Absent Absent   ASSESSMENT/PLAN:   Type 2 diabetes mellitus, uncontrolled (HCC) A1c improved to 7.6. Patient w/o supplies to monitor CBGs will send rx to pharmacy. Out of semaglutide for 5 weeks. 2 samples given today (cost is an issue); rx to pharmacy. Consider discontinuing with improving A1c or finding a more cost effective alternative. Follow up in 6 months.   Hx of trichomoniasis - POCT Wet Prep w/o trichomoniasis; unremarkable  Pap smear for cervical cancer screening - f/u Cytology - PAP([place])  Foot callus Non concerning findings. Patient would like to go to podiatry. - Ambulatory referral to Podiatry  Encounter for screening mammogram for malignant neoplasm of breast  Kimberly Jumbo, DO [facility]

## 2020-04-02 LAB — CYTOLOGY - PAP
Adequacy: ABSENT
Comment: NEGATIVE
Diagnosis: NEGATIVE
Diagnosis: REACTIVE
High risk HPV: NEGATIVE

## 2020-04-02 NOTE — Assessment & Plan Note (Signed)
A1c improved to 7.6. Patient w/o supplies to monitor CBGs will send rx to pharmacy. Out of semaglutide for 5 weeks. 2 samples given today (cost is an issue); rx to pharmacy. Consider discontinuing with improving A1c or finding a more cost effective alternative. Follow up in 6 months.

## 2020-04-06 ENCOUNTER — Other Ambulatory Visit: Payer: Self-pay | Admitting: Rheumatology

## 2020-04-06 NOTE — Telephone Encounter (Signed)
Last Visit: 11/21/2019 Next Visit: 04/30/2020 Labs: 07/29/2019 Glucose is elevated-191. Rest of CMP WNL. RBC count, Hgb, and hct are elevated but stable. Rest of CBC WNL.  Eye exam:  Do not have one on file.   Current Dose per office note 11/21/2019: Plaquenil 200 mg p.o. daily DX: Other systemic lupus erythematosus with other organ involvement   Patient advised she is due to update labs. Patient states she has an PLQ eye exam in mid January 2022. Patient states she will come in the office next week to have them updated.   Okay to refill Plaquenil?

## 2020-04-08 ENCOUNTER — Other Ambulatory Visit: Payer: Self-pay | Admitting: Family Medicine

## 2020-04-08 DIAGNOSIS — E1165 Type 2 diabetes mellitus with hyperglycemia: Secondary | ICD-10-CM

## 2020-04-08 MED ORDER — ONETOUCH VERIO REFLECT W/DEVICE KIT: 1.0000 | PACK | 0 refills | Status: DC

## 2020-04-08 MED ORDER — METFORMIN HCL ER 500 MG PO TB24: 500.0000 mg | ORAL_TABLET | Freq: Every day | ORAL | 3 refills | Status: DC

## 2020-04-08 MED ORDER — ONETOUCH VERIO VI STRP: ORAL_STRIP | 3 refills | Status: DC

## 2020-04-08 MED ORDER — ATORVASTATIN CALCIUM 20 MG PO TABS
20.0000 mg | ORAL_TABLET | Freq: Every day | ORAL | 3 refills | Status: DC
Start: 2020-04-08 — End: 2021-01-27

## 2020-04-08 MED ORDER — ONETOUCH DELICA LANCING DEV MISC: 1.0000 | Freq: Every day | 4 refills | Status: AC

## 2020-04-08 NOTE — Progress Notes (Signed)
Patient request via Pharmacist for metformin, atorvastatin, glucose meter and glucose testing strips to be sent to [facility] in [place].

## 2020-04-13 ENCOUNTER — Other Ambulatory Visit: Payer: Self-pay | Admitting: *Deleted

## 2020-04-13 ENCOUNTER — Other Ambulatory Visit: Payer: Self-pay

## 2020-04-13 DIAGNOSIS — E1165 Type 2 diabetes mellitus with hyperglycemia: Secondary | ICD-10-CM

## 2020-04-13 MED ORDER — ONETOUCH DELICA LANCETS 33G MISC
1.0000 [IU] | Freq: Every day | 2 refills | Status: DC
Start: 2020-04-13 — End: 2020-11-03

## 2020-04-13 MED ORDER — ONETOUCH DELICA LANCETS 33G MISC: 1.0000 [IU] | Freq: Every day | 2 refills | Status: DC
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
## 2020-04-27 ENCOUNTER — Ambulatory Visit: Payer: Medicare Other | Admitting: Podiatry

## 2020-04-30 ENCOUNTER — Ambulatory Visit: Payer: Medicare Other | Admitting: Podiatry

## 2020-04-30 ENCOUNTER — Ambulatory Visit: Payer: Medicare Other | Admitting: Rheumatology
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
## 2020-05-18 ENCOUNTER — Encounter: Payer: Self-pay | Admitting: Rheumatology

## 2020-05-18 ENCOUNTER — Ambulatory Visit (INDEPENDENT_AMBULATORY_CARE_PROVIDER_SITE_OTHER): Payer: Medicare Other | Admitting: Rheumatology

## 2020-05-18 ENCOUNTER — Other Ambulatory Visit: Payer: Self-pay

## 2020-05-18 VITALS — BP 151/92 | HR 76 | Resp 16 | Ht 62.5 in | Wt 200.6 lb

## 2020-05-18 DIAGNOSIS — E1165 Type 2 diabetes mellitus with hyperglycemia: Secondary | ICD-10-CM

## 2020-05-18 DIAGNOSIS — G8929 Other chronic pain: Secondary | ICD-10-CM

## 2020-05-18 DIAGNOSIS — M25561 Pain in right knee: Secondary | ICD-10-CM | POA: Diagnosis not present

## 2020-05-18 DIAGNOSIS — M5136 Other intervertebral disc degeneration, lumbar region: Secondary | ICD-10-CM | POA: Diagnosis not present

## 2020-05-18 DIAGNOSIS — M3219 Other organ or system involvement in systemic lupus erythematosus: Secondary | ICD-10-CM

## 2020-05-18 DIAGNOSIS — M19041 Primary osteoarthritis, right hand: Secondary | ICD-10-CM | POA: Diagnosis not present

## 2020-05-18 DIAGNOSIS — F17200 Nicotine dependence, unspecified, uncomplicated: Secondary | ICD-10-CM | POA: Diagnosis not present

## 2020-05-18 DIAGNOSIS — I10 Essential (primary) hypertension: Secondary | ICD-10-CM | POA: Diagnosis not present

## 2020-05-18 DIAGNOSIS — R5383 Other fatigue: Secondary | ICD-10-CM

## 2020-05-18 DIAGNOSIS — Z13820 Encounter for screening for osteoporosis: Secondary | ICD-10-CM

## 2020-05-18 DIAGNOSIS — M19042 Primary osteoarthritis, left hand: Secondary | ICD-10-CM

## 2020-05-18 DIAGNOSIS — Z79899 Other long term (current) drug therapy: Secondary | ICD-10-CM | POA: Diagnosis not present

## 2020-05-18 DIAGNOSIS — M25562 Pain in left knee: Secondary | ICD-10-CM

## 2020-05-18 DIAGNOSIS — M19071 Primary osteoarthritis, right ankle and foot: Secondary | ICD-10-CM

## 2020-05-18 DIAGNOSIS — M51369 Other intervertebral disc degeneration, lumbar region without mention of lumbar back pain or lower extremity pain: Secondary | ICD-10-CM

## 2020-05-18 DIAGNOSIS — Z7189 Other specified counseling: Secondary | ICD-10-CM | POA: Diagnosis not present

## 2020-05-18 DIAGNOSIS — M19072 Primary osteoarthritis, left ankle and foot: Secondary | ICD-10-CM

## 2020-05-18 DIAGNOSIS — G894 Chronic pain syndrome: Secondary | ICD-10-CM

## 2020-05-18 NOTE — Patient Instructions (Signed)
Journal for Nurse Practitioners, 15(4), 263-267. Retrieved January 29, 2018 from http://clinicalkey.com/nursing">  Knee Exercises Ask your health care provider which exercises are safe for you. Do exercises exactly as told by your health care provider and adjust them as directed. It is normal to feel mild stretching, pulling, tightness, or discomfort as you do these exercises. Stop right away if you feel sudden pain or your pain gets worse. Do not begin these exercises until told by your health care provider. Stretching and range-of-motion exercises These exercises warm up your muscles and joints and improve the movement and flexibility of your knee. These exercises also help to relieve pain and swelling. Knee extension, prone 1. Lie on your abdomen (prone position) on a bed. 2. Place your left / right knee just beyond the edge of the surface so your knee is not on the bed. You can put a towel under your left / right thigh just above your kneecap for comfort. 3. Relax your leg muscles and allow gravity to straighten your knee (extension). You should feel a stretch behind your left / right knee. 4. Hold this position for __________ seconds. 5. Scoot up so your knee is supported between repetitions. Repeat __________ times. Complete this exercise __________ times a day. Knee flexion, active 1. Lie on your back with both legs straight. If this causes back discomfort, bend your left / right knee so your foot is flat on the floor. 2. Slowly slide your left / right heel back toward your buttocks. Stop when you feel a gentle stretch in the front of your knee or thigh (flexion). 3. Hold this position for __________ seconds. 4. Slowly slide your left / right heel back to the starting position. Repeat __________ times. Complete this exercise __________ times a day.   Quadriceps stretch, prone 1. Lie on your abdomen on a firm surface, such as a bed or padded floor. 2. Bend your left / right knee and hold  your ankle. If you cannot reach your ankle or pant leg, loop a belt around your foot and grab the belt instead. 3. Gently pull your heel toward your buttocks. Your knee should not slide out to the side. You should feel a stretch in the front of your thigh and knee (quadriceps). 4. Hold this position for __________ seconds. Repeat __________ times. Complete this exercise __________ times a day.   Hamstring, supine 1. Lie on your back (supine position). 2. Loop a belt or towel over the ball of your left / right foot. The ball of your foot is on the walking surface, right under your toes. 3. Straighten your left / right knee and slowly pull on the belt to raise your leg until you feel a gentle stretch behind your knee (hamstring). ? Do not let your knee bend while you do this. ? Keep your other leg flat on the floor. 4. Hold this position for __________ seconds. Repeat __________ times. Complete this exercise __________ times a day. Strengthening exercises These exercises build strength and endurance in your knee. Endurance is the ability to use your muscles for a long time, even after they get tired. Quadriceps, isometric This exercise stretches the muscles in front of your thigh (quadriceps) without moving your knee joint (isometric). 1. Lie on your back with your left / right leg extended and your other knee bent. Put a rolled towel or small pillow under your knee if told by your health care provider. 2. Slowly tense the muscles in the front of your   left / right thigh. You should see your kneecap slide up toward your hip or see increased dimpling just above the knee. This motion will push the back of the knee toward the floor. 3. For __________ seconds, hold the muscle as tight as you can without increasing your pain. 4. Relax the muscles slowly and completely. Repeat __________ times. Complete this exercise __________ times a day.   Straight leg raises This exercise stretches the muscles in  front of your thigh (quadriceps) and the muscles that move your hips (hip flexors). 1. Lie on your back with your left / right leg extended and your other knee bent. 2. Tense the muscles in the front of your left / right thigh. You should see your kneecap slide up or see increased dimpling just above the knee. Your thigh may even shake a bit. 3. Keep these muscles tight as you raise your leg 4-6 inches (10-15 cm) off the floor. Do not let your knee bend. 4. Hold this position for __________ seconds. 5. Keep these muscles tense as you lower your leg. 6. Relax your muscles slowly and completely after each repetition. Repeat __________ times. Complete this exercise __________ times a day. Hamstring, isometric 1. Lie on your back on a firm surface. 2. Bend your left / right knee about __________ degrees. 3. Dig your left / right heel into the surface as if you are trying to pull it toward your buttocks. Tighten the muscles in the back of your thighs (hamstring) to "dig" as hard as you can without increasing any pain. 4. Hold this position for __________ seconds. 5. Release the tension gradually and allow your muscles to relax completely for __________ seconds after each repetition. Repeat __________ times. Complete this exercise __________ times a day. Hamstring curls If told by your health care provider, do this exercise while wearing ankle weights. Begin with __________ lb weights. Then increase the weight by 1 lb (0.5 kg) increments. Do not wear ankle weights that are more than __________ lb. 1. Lie on your abdomen with your legs straight. 2. Bend your left / right knee as far as you can without feeling pain. Keep your hips flat against the floor. 3. Hold this position for __________ seconds. 4. Slowly lower your leg to the starting position. Repeat __________ times. Complete this exercise __________ times a day.   Squats This exercise strengthens the muscles in front of your thigh and knee  (quadriceps). 1. Stand in front of a table, with your feet and knees pointing straight ahead. You may rest your hands on the table for balance but not for support. 2. Slowly bend your knees and lower your hips like you are going to sit in a chair. ? Keep your weight over your heels, not over your toes. ? Keep your lower legs upright so they are parallel with the table legs. ? Do not let your hips go lower than your knees. ? Do not bend lower than told by your health care provider. ? If your knee pain increases, do not bend as low. 3. Hold the squat position for __________ seconds. 4. Slowly push with your legs to return to standing. Do not use your hands to pull yourself to standing. Repeat __________ times. Complete this exercise __________ times a day. Wall slides This exercise strengthens the muscles in front of your thigh and knee (quadriceps). 1. Lean your back against a smooth wall or door, and walk your feet out 18-24 inches (46-61 cm) from it. 2.   Place your feet hip-width apart. 3. Slowly slide down the wall or door until your knees bend __________ degrees. Keep your knees over your heels, not over your toes. Keep your knees in line with your hips. 4. Hold this position for __________ seconds. Repeat __________ times. Complete this exercise __________ times a day.   Straight leg raises This exercise strengthens the muscles that rotate the leg at the hip and move it away from your body (hip abductors). 1. Lie on your side with your left / right leg in the top position. Lie so your head, shoulder, knee, and hip line up. You may bend your bottom knee to help you keep your balance. 2. Roll your hips slightly forward so your hips are stacked directly over each other and your left / right knee is facing forward. 3. Leading with your heel, lift your top leg 4-6 inches (10-15 cm). You should feel the muscles in your outer hip lifting. ? Do not let your foot drift forward. ? Do not let your  knee roll toward the ceiling. 4. Hold this position for __________ seconds. 5. Slowly return your leg to the starting position. 6. Let your muscles relax completely after each repetition. Repeat __________ times. Complete this exercise __________ times a day.   Straight leg raises This exercise stretches the muscles that move your hips away from the front of the pelvis (hip extensors). 1. Lie on your abdomen on a firm surface. You can put a pillow under your hips if that is more comfortable. 2. Tense the muscles in your buttocks and lift your left / right leg about 4-6 inches (10-15 cm). Keep your knee straight as you lift your leg. 3. Hold this position for __________ seconds. 4. Slowly lower your leg to the starting position. 5. Let your leg relax completely after each repetition. Repeat __________ times. Complete this exercise __________ times a day. This information is not intended to replace advice given to you by your health care provider. Make sure you discuss any questions you have with your health care provider. Document Revised: 01/30/2018 Document Reviewed: 01/30/2018 Elsevier Patient Education  2021 [employer]  

## 2020-05-19 LAB — COMPLETE METABOLIC PANEL WITH GFR
AG Ratio: 1.5 (calc) (ref 1.0–2.5)
ALT: 8 U/L (ref 6–29)
AST: 10 U/L (ref 10–35)
Albumin: 4.1 g/dL (ref 3.6–5.1)
Alkaline phosphatase (APISO): 61 U/L (ref 37–153)
BUN: 12 mg/dL (ref 7–25)
CO2: 26 mmol/L (ref 20–32)
Calcium: 9.5 mg/dL (ref 8.6–10.4)
Chloride: 107 mmol/L (ref 98–110)
Creat: 0.95 mg/dL (ref 0.50–1.05)
GFR, Est African American: 78 mL/min/{1.73_m2} (ref >=60)
GFR, Est Non African American: 67 mL/min/{1.73_m2} (ref >=60)
Globulin: 2.8 g/dL (calc) (ref 1.9–3.7)
Glucose, Bld: 121 mg/dL — ABNORMAL HIGH (ref 65–99)
Potassium: 4.1 mmol/L (ref 3.5–5.3)
Sodium: 141 mmol/L (ref 135–146)
Total Bilirubin: 0.3 mg/dL (ref 0.2–1.2)
Total Protein: 6.9 g/dL (ref 6.1–8.1)

## 2020-05-19 LAB — C3 AND C4
C3 Complement: 150 mg/dL (ref 83–193)
C4 Complement: 52 mg/dL (ref 15–57)

## 2020-05-19 LAB — CBC WITH DIFFERENTIAL/PLATELET
Absolute Monocytes: 450 cells/uL (ref 200–950)
Basophils Absolute: 30 cells/uL (ref 0–200)
Basophils Relative: 0.5 %
Eosinophils Absolute: 162 cells/uL (ref 15–500)
Eosinophils Relative: 2.7 %
HCT: 47.1 % — ABNORMAL HIGH (ref 35.0–45.0)
Hemoglobin: 16 g/dL — ABNORMAL HIGH (ref 11.7–15.5)
Lymphs Abs: 1686 cells/uL (ref 850–3900)
MCH: 28.6 pg (ref 27.0–33.0)
MCHC: 34 g/dL (ref 32.0–36.0)
MCV: 84.1 fL (ref 80.0–100.0)
MPV: 10.3 fL (ref 7.5–12.5)
Monocytes Relative: 7.5 %
Neutro Abs: 3672 cells/uL (ref 1500–7800)
Neutrophils Relative %: 61.2 %
Platelets: 221 10*3/uL (ref 140–400)
RBC: 5.6 10*6/uL — ABNORMAL HIGH (ref 3.80–5.10)
RDW: 12.3 % (ref 11.0–15.0)
Total Lymphocyte: 28.1 %
WBC: 6 10*3/uL (ref 3.8–10.8)

## 2020-05-19 LAB — URINALYSIS, ROUTINE W REFLEX MICROSCOPIC
Bilirubin Urine: NEGATIVE
Glucose, UA: NEGATIVE
Hgb urine dipstick: NEGATIVE
Leukocytes,Ua: NEGATIVE
Nitrite: NEGATIVE
Protein, ur: NEGATIVE
Specific Gravity, Urine: 1.025 (ref 1.001–1.03)
pH: <=5 (ref 5.0–8.0)

## 2020-05-19 LAB — ANTI-DNA ANTIBODY, DOUBLE-STRANDED: ds DNA Ab: <1 IU/mL

## 2020-05-19 LAB — SEDIMENTATION RATE: Sed Rate: 6 mm/h (ref 0–30)

## 2020-05-19 NOTE — Progress Notes (Signed)
Elevated hemoglobin noted, most likely due to smoking.  CMP shows mildly elevated glucose, probably not a fasting sample.  All other labs are within normal limits.

## 2020-06-08 ENCOUNTER — Other Ambulatory Visit: Payer: Self-pay

## 2020-06-08 DIAGNOSIS — E1165 Type 2 diabetes mellitus with hyperglycemia: Secondary | ICD-10-CM

## 2020-06-08 MED ORDER — ONETOUCH VERIO VI STRP
ORAL_STRIP | 3 refills | Status: DC
Start: 2020-06-08 — End: 2020-11-30

## 2020-06-15 ENCOUNTER — Other Ambulatory Visit: Payer: Self-pay | Admitting: Family Medicine

## 2020-06-15 DIAGNOSIS — E1165 Type 2 diabetes mellitus with hyperglycemia: Secondary | ICD-10-CM

## 2020-07-24 ENCOUNTER — Telehealth: Payer: Self-pay

## 2020-07-24 ENCOUNTER — Other Ambulatory Visit: Payer: Self-pay | Admitting: Family Medicine

## 2020-07-24 MED ORDER — FLUCONAZOLE 150 MG PO TABS: 150.0000 mg | ORAL_TABLET | Freq: Once | ORAL | 0 refills | Status: AC

## 2020-07-24 NOTE — Telephone Encounter (Signed)
Patient calls nurse line regarding receiving medication for yeast infection. Patient reports vaginal discharge and vaginal itching.  Patient reports that she has a history of recurrent yeast infections and would like medication to be sent into [facility] on [address] if appropriate.   To PCP  Veronda Prude, RN

## 2020-07-24 NOTE — Telephone Encounter (Signed)
RX for diflucan sent to pharmacy. Contacted patient and recommended she be scheduled for appt as elevated BG likely associated with recurrent yeast infections.  Reports she ran out of diabetes medications and has not been eating according to her diabetes diet.  Patient requests to call on 07/27/20 to schedule follow up for DM as she has not been seen since Apr 08, 2020. Patient reports she has since been able to pick up her DM medications but is still having vaginal pruritis.

## 2020-08-14 ENCOUNTER — Ambulatory Visit (INDEPENDENT_AMBULATORY_CARE_PROVIDER_SITE_OTHER): Payer: Medicare Other | Admitting: Family Medicine

## 2020-08-14 ENCOUNTER — Other Ambulatory Visit: Payer: Self-pay

## 2020-08-14 ENCOUNTER — Other Ambulatory Visit (HOSPITAL_COMMUNITY)
Admission: RE | Admit: 2020-08-14 | Discharge: 2020-08-14 | Disposition: A | Discharge status: Home / Self Care | Payer: Medicare Other | Source: Ambulatory Visit | Attending: Family Medicine | Admitting: Family Medicine

## 2020-08-14 VITALS — BP 133/77 | HR 78 | Ht 62.5 in | Wt 198.8 lb

## 2020-08-14 DIAGNOSIS — E1165 Type 2 diabetes mellitus with hyperglycemia: Secondary | ICD-10-CM | POA: Insufficient documentation

## 2020-08-14 DIAGNOSIS — A599 Trichomoniasis, unspecified: Secondary | ICD-10-CM

## 2020-08-14 LAB — POCT WET PREP (WET MOUNT)
Clue Cells Wet Prep Whiff POC: NEGATIVE
WBC, Wet Prep HPF POC: >20

## 2020-08-14 LAB — POCT GLYCOSYLATED HEMOGLOBIN (HGB A1C): HbA1c, POC (controlled diabetic range): 7.2 % — AB (ref 0.0–7.0)

## 2020-08-14 MED ORDER — METRONIDAZOLE 500 MG PO TABS: 500.0000 mg | ORAL_TABLET | Freq: Two times a day (BID) | ORAL | 0 refills | Status: AC

## 2020-08-14 NOTE — Patient Instructions (Signed)
It was great seeing you today!  Please check-out at the front desk before leaving the clinic. I'd like to see you back in 3 months but if you need to be seen earlier than that for any new issues we're happy to fit you in, just give [place] a call!  Visit Remembers: - Stop by the pharmacy to pick up your prescriptions. Avoid alcohol while taking this medication and it can cause profound vomiting.  - Continue to work on your healthy eating habits and incorporating exercise into your daily life. (see below) - Your goal is to have an A1c <7    Diet Recommendations for Diabetes  Carbohydrate includes starch, sugar, and fiber.  Of these, only sugar and starch raise blood glucose.  (Fiber is found in fruits, vegetables [especially skin, seeds, and stalks] and whole grains.)   Starchy (carb) foods: Bread, rice, pasta, potatoes, corn, cereal, grits, crackers, bagels, muffins, all baked goods.  (Fruit, milk, and yogurt also have carbohydrate, but most of these foods will not spike your blood sugar as most starchy foods will.)  A few fruits do cause high blood sugars; use small portions of bananas (limit to 1/2 at a time), grapes, watermelon, oranges, and most tropical fruits.   Protein foods: Meat, fish, poultry, eggs, dairy foods, and beans such as pinto and kidney beans (beans also provide carbohydrate).   1. Eat at least REAL 3 meals and 1-2 snacks per day. Never go more than 4-5 hours while awake without eating. Eat breakfast within the first hour of getting up.   2. Limit starchy foods to TWO per meal and ONE per snack. ONE portion of a starchy food is equal to the following:   - ONE slice of bread (or its equivalent, such as half of a hamburger bun).   - 1/2 cup of a "scoopable" starchy food such as potatoes or rice.   - 15 grams of Total Carbohydrate as shown on food label.   - Every 4 ounces of a sweet drink (including fruit juice). 3. Include at every meal: a protein food, a carb food, and  vegetables and/or fruit.   - Obtain twice the volume of veg's as protein or carbohydrate foods for both lunch and dinner.   - Fresh or frozen veg's are best.   - Keep frozen veg's on hand for a quick vegetable serving.       Regarding lab work today:  Due to recent changes in healthcare laws, you may see the results of your imaging and laboratory studies on MyChart before your doctor has had a chance to review them.  We understand that in some cases there may be results that are confusing or concerning to you. Not all laboratory results come back in the same time frame and your doctor may be waiting for multiple results in order to interpret others.  Please give [place] 72 hours in order for your doctor to thoroughly review all the results before contacting the office for clarification of your results. If everything is normal, you will get a letter in the mail or a message in My Chart. Please give [place] a call if you do not hear from [place] after 2 weeks.  Please bring all of your medications with you to each visit.    If you haven't already, sign up for My Chart to have easy access to your labs results, and communication with your primary care physician.  Feel free to call with any questions or concerns at  any time, at [phone].   Take care,  Dr. Katherina [facility]

## 2020-08-14 NOTE — Progress Notes (Signed)
    SUBJECTIVE:   CHIEF COMPLAINT / HPI:   Chief Complaint  Patient presents with  . Vaginitis    [name] is a 55 y.o. female presents for vaginal discharge.  Vaginal Discharge Having vaginal discharge for 10 days. Discharge pulselike  "rotten trash". Has some vaginal itching.  Discharge consistency: thin Discharge color: yellowish-green Medications tried: Fluconazole with minimal relief as she has had similar symptoms in the past due to her uncontrolled diabetes. No recent antibiotic use  - Denies dysuria, abdominal pain, urinary urgency,  hematuria, nausea or vomiting, fevers or pelvic pain.  - Patient reports trichomoniasis about 2 years ago.  - Sexully active with one female partner and has used condoms since her discharge started. He is not having in sx.  -LMP: post-menopausal    Symptoms Fever: no Dysuria:no Vaginal bleeding: no Abdomen or Pelvic pain: no Back pain: no Genital sores or ulcers:no Rash: no Pain during sex: no Odor: yes    Diabetes Mellitus  Eats 5-6 small meals a day. Denies missing any doses of DM medications and takes Metformin and Ozempic (Saturday or Sunday) . Denies increased thirst, hunger. No recent glycemic events. Gets hungry in the earlier morning hours. States she gets hungry at night. Snacks on chips, cheese, cookies. She snacks mostly at night. Drinks plenty of water.         PERTINENT  PMH / PSH: reviewed and updated as appropriate   OBJECTIVE:   BP 133/77   Pulse 78   Ht 5' 2.5" (1.588 m)   Wt 198 lb 12.8 oz (90.2 kg)   SpO2 99%   BMI 35.78 kg/m   GEN: well appearing female in no acute distress  [facility]: well perfused  RESP: speaking in full sentences without pause  ABD: soft, non-tender, non-distended, no palpable masses  Pelvic exam: normal external genitalia, vulva, VAGINA and CERVIX: lesions absent, cervical discharge present - copious and green, cervical motion tenderness absent, ADNEXA: normal adnexa in size,  nontender and no masses, WET MOUNT done - results: KOH done, excessive bacteria, trichomonads, exam chaperoned by  CMA Tashira.     ASSESSMENT/PLAN:   Type 2 diabetes mellitus, uncontrolled (HCC) A1c improved to 7.2 from 7.6. Continue current regimen: metformin and Ozempic. Encouraged continued diet rich in vegetables and complex carbs.  Heart healthy carb modified diet. Counseled on need to continue exercising. Follow up with PCP in 3 months.      Trichimoniasis  Confirmed on wet prep.  Symptoms consistent with this. G/C Percell Locus is pending.  Declines HIV and syphilis testing. - Treatment: Flagyl 500 BID x 7 days and abstain from coitus during course of treatment. Advised patient to not drink alcohol while taking this medication.  - F/U if symptoms not improving or getting worse.  - Will f/u on G/C Chlamydia and call in Rx if positive.  - Self care instructions given including avoiding douching. Handout given.  - Expedited partner treatment given - Return precautions including abdominal pain, fever, chills, nausea, or vomiting given.                 Katha Cabal, DO  [facility]

## 2020-08-14 NOTE — Progress Notes (Incomplete)
   SUBJECTIVE:   CHIEF COMPLAINT / HPI:      [name] is a 55 y.o. female here for ***   Pt reports ***    PERTINENT  PMH / PSH: reviewed and updated as appropriate   OBJECTIVE:   BP 133/77   Pulse 78   Ht 5' 2.5" (1.588 m)   Wt 198 lb 12.8 oz (90.2 kg)   SpO2 99%   BMI 35.78 kg/m   ***  ASSESSMENT/PLAN:   No problem-specific Assessment & Plan notes found for this encounter.     Katha Cabal, DO PGY-2, [facility] 08/14/2020      {    This will disappear when note is signed, click to select method of visit    :1}

## 2020-08-17 LAB — CERVICOVAGINAL ANCILLARY ONLY
Chlamydia: NEGATIVE
Comment: NEGATIVE
Comment: NORMAL
Neisseria Gonorrhea: NEGATIVE

## 2020-08-18 ENCOUNTER — Other Ambulatory Visit: Payer: Self-pay

## 2020-08-18 DIAGNOSIS — E1165 Type 2 diabetes mellitus with hyperglycemia: Secondary | ICD-10-CM

## 2020-08-18 NOTE — Assessment & Plan Note (Signed)
A1c improved to 7.2 from 7.6. Continue current regimen: metformin and Ozempic. Encouraged continued diet rich in vegetables and complex carbs.  Heart healthy carb modified diet. Counseled on need to continue exercising. Follow up with PCP in 3 months.

## 2020-08-18 NOTE — Assessment & Plan Note (Addendum)
Confirmed on wet prep.  Symptoms consistent with this. G/C Percell Locus is pending.  Declines HIV and syphilis testing. - Treatment: Flagyl 500 BID x 7 days and abstain from coitus during course of treatment. Advised patient to not drink alcohol while taking this medication.  - F/U if symptoms not improving or getting worse.  - Will f/u on G/C Chlamydia and call in Rx if positive.  - Self care instructions given including avoiding douching. Handout given.  - Expedited partner treatment given - Return precautions including abdominal pain, fever, chills, nausea, or vomiting given.

## 2020-09-02 ENCOUNTER — Other Ambulatory Visit: Payer: Self-pay | Admitting: Rheumatology

## 2020-09-03 ENCOUNTER — Telehealth: Payer: Self-pay

## 2020-09-03 NOTE — Telephone Encounter (Signed)
Last Visit: 05/18/2020 Next Visit: 10/15/2020 Labs: 05/18/2020, Elevated hemoglobin noted, most likely due to smoking. CMP shows mildly elevated glucose, probably not a fasting sample. All other labs are within normal limits Eye exam: mail box full  Current Dose per office note 05/18/2020, Plaquenil 200 mg p.o. daily  DX: Other systemic lupus erythematosus with other organ involvement   Last Fill: 04/06/2020  Okay to refill Plaquenil?

## 2020-09-03 NOTE — Telephone Encounter (Signed)
Patient called stating she was returning your call.   °

## 2020-09-03 NOTE — Telephone Encounter (Signed)
Patient needs PLQ eye exam, voicemail box full

## 2020-09-03 NOTE — Telephone Encounter (Signed)
Patient has appt 09/2020 for PLQ eye exam
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
## 2020-10-15 ENCOUNTER — Encounter: Payer: Self-pay | Admitting: Rheumatology

## 2020-10-15 ENCOUNTER — Other Ambulatory Visit: Payer: Self-pay

## 2020-10-15 ENCOUNTER — Ambulatory Visit (INDEPENDENT_AMBULATORY_CARE_PROVIDER_SITE_OTHER): Payer: Medicare Other | Admitting: Rheumatology

## 2020-10-15 VITALS — BP 168/83 | HR 77 | Resp 16 | Ht 62.5 in | Wt 206.7 lb

## 2020-10-15 DIAGNOSIS — M19042 Primary osteoarthritis, left hand: Secondary | ICD-10-CM | POA: Diagnosis not present

## 2020-10-15 DIAGNOSIS — Z13820 Encounter for screening for osteoporosis: Secondary | ICD-10-CM | POA: Diagnosis not present

## 2020-10-15 DIAGNOSIS — Z79899 Other long term (current) drug therapy: Secondary | ICD-10-CM | POA: Diagnosis not present

## 2020-10-15 DIAGNOSIS — M19071 Primary osteoarthritis, right ankle and foot: Secondary | ICD-10-CM | POA: Diagnosis not present

## 2020-10-15 DIAGNOSIS — R5383 Other fatigue: Secondary | ICD-10-CM

## 2020-10-15 DIAGNOSIS — M19072 Primary osteoarthritis, left ankle and foot: Secondary | ICD-10-CM

## 2020-10-15 DIAGNOSIS — M5136 Other intervertebral disc degeneration, lumbar region: Secondary | ICD-10-CM

## 2020-10-15 DIAGNOSIS — M3219 Other organ or system involvement in systemic lupus erythematosus: Secondary | ICD-10-CM

## 2020-10-15 DIAGNOSIS — M25562 Pain in left knee: Secondary | ICD-10-CM

## 2020-10-15 DIAGNOSIS — I10 Essential (primary) hypertension: Secondary | ICD-10-CM

## 2020-10-15 DIAGNOSIS — F17200 Nicotine dependence, unspecified, uncomplicated: Secondary | ICD-10-CM | POA: Diagnosis not present

## 2020-10-15 DIAGNOSIS — M19041 Primary osteoarthritis, right hand: Secondary | ICD-10-CM

## 2020-10-15 DIAGNOSIS — G8929 Other chronic pain: Secondary | ICD-10-CM

## 2020-10-15 DIAGNOSIS — E1165 Type 2 diabetes mellitus with hyperglycemia: Secondary | ICD-10-CM | POA: Diagnosis not present

## 2020-10-15 DIAGNOSIS — M25561 Pain in right knee: Secondary | ICD-10-CM

## 2020-10-15 NOTE — Patient Instructions (Addendum)
Vaccines You are taking a medication(s) that can suppress your immune system.  The following immunizations are recommended: Flu annually Covid-19  Td/Tdap (tetanus, diphtheria, pertussis) every 10 years Pneumonia (Prevnar 15 then Pneumovax 23 at least 1 year apart.  Alternatively, can take Prevnar 20 without needing additional dose) Shingrix (after age 57): 2 doses from 4 weeks to 6 months apart  Please check with your PCP to make sure you are up to date.  Heart Disease Prevention   Your inflammatory disease increases your risk of heart disease which includes heart attack, stroke, atrial fibrillation (irregular heartbeats), high blood pressure, heart failure and atherosclerosis (plaque in the arteries).  It is important to reduce your risk by:   Keep blood pressure, cholesterol, and blood sugar at healthy levels   Smoking Cessation   Maintain a healthy weight  BMI 20-25   Eat a healthy diet  Plenty of fresh fruit, vegetables, and whole grains  Limit saturated fats, foods high in sodium, and added sugars  DASH and Mediterranean diet   Increase physical activity  Recommend moderate physically activity for 150 minutes per week/ 30 minutes a day for five days a week These can be broken up into three separate ten-minute sessions during the day.   Reduce Stress  Meditation, slow breathing exercises, yoga, coloring books  Dental visits twice a year      Steps to Quit Smoking Smoking tobacco is the leading cause of preventable death. It can affect almost every organ in the body. Smoking puts you and those around you at risk for developing many serious chronic diseases. Quitting smoking can be difficult, but it is one of the best things that you can do for your health. It is nevertoo late to quit. How do I get ready to quit? When you decide to quit smoking, create a plan to help you succeed. Before you quit: Pick a date to quit. Set a date within the next 2 weeks to give you time to  prepare. Write down the reasons why you are quitting. Keep this list in places where you will see it often. Tell your family, friends, and co-workers that you are quitting. Support from your loved ones can make quitting easier. Talk with your health care provider about your options for quitting smoking. Find out what treatment options are covered by your health insurance. Identify people, places, things, and activities that make you want to smoke (triggers). Avoid them. What first steps can I take to quit smoking? Throw away all cigarettes at home, at work, and in your car. Throw away smoking accessories, such as Set designer. Clean your car. Make sure to empty the ashtray. Clean your home, including curtains and carpets. What strategies can I use to quit smoking? Talk with your health care provider about combining strategies, such as taking medicines while you are also receiving in-person counseling. Using these two strategies together makes you more likely to succeed in quitting than if you used either strategy on its own. If you are pregnant or breastfeeding, talk with your health care provider about finding counseling or other support strategies to quit smoking. Do not take medicine to help you quit smoking unless your health care provider tells you to do so. To quit smoking: Quit right away Quit smoking completely, instead of gradually reducing how much you smoke over a period of time. Research shows that stopping smoking right away is more successful than gradually quitting. Attend in-person counseling to help you build problem-solving skills.  You are more likely to succeed in quitting if you attend counseling sessions regularly. Even short sessions of 10 minutes can be effective. Take medicine You may take medicines to help you quit smoking. Some medicines require a prescription and some you can purchase over-the-counter. Medicines may have nicotine in them to replace the nicotine  in cigarettes. Medicines may: Help to stop cravings. Help to relieve withdrawal symptoms. Your health care provider may recommend: Nicotine patches, gum, or lozenges. Nicotine inhalers or sprays. Non-nicotine medicine that is taken by mouth. Find resources Find resources and support systems that can help you to quit smoking and remain smoke-free after you quit. These resources are most helpful when you use them often. They include: Online chats with a Veterinary surgeon. Telephone quitlines. Printed Materials engineer. Support groups or group counseling. Text messaging programs. Mobile phone apps or applications. Use apps that can help you stick to your quit plan by providing reminders, tips, and encouragement. There are many free apps for mobile devices as well as websites. Examples include Quit Guide from the [employer] and smokefree.gov What things can I do to make it easier to quit?  Reach out to your family and friends for support and encouragement. Call telephone quitlines (1-800-QUIT-NOW), reach out to support groups, or work with a counselor for support. Ask people who smoke to avoid smoking around you. Avoid places that trigger you to smoke, such as bars, parties, or smoke-break areas at work. Spend time with people who do not smoke. Lessen the stress in your life. Stress can be a smoking trigger for some people. To lessen stress, try: Exercising regularly. Doing deep-breathing exercises. Doing yoga. Meditating. Performing a body scan. This involves closing your eyes, scanning your body from head to toe, and noticing which parts of your body are particularly tense. Try to relax the muscles in those areas. How will I feel when I quit smoking? Day 1 to 3 weeks Within the first 24 hours of quitting smoking, you may start to feel withdrawal symptoms. These symptoms are usually most noticeable 2-3 days after quitting, but they usually do not last for more than 2-3 weeks. You may experience these  symptoms: Mood swings. Restlessness, anxiety, or irritability. Trouble concentrating. Dizziness. Strong cravings for sugary foods and nicotine. Mild weight gain. Constipation. Nausea. Coughing or a sore throat. Changes in how the medicines that you take for unrelated issues work in your body. Depression. Trouble sleeping (insomnia). Week 3 and afterward After the first 2-3 weeks of quitting, you may start to notice more positive results, such as: Improved sense of smell and taste. Decreased coughing and sore throat. Slower heart rate. Lower blood pressure. Clearer skin. The ability to breathe more easily. Fewer sick days. Quitting smoking can be very challenging. Do not get discouraged if you are not successful the first time. Some people need to make many attempts to quit before they achieve long-term success. Do your best to stick to your quit plan, and talk with your health care provider if you haveany questions or concerns. Summary Smoking tobacco is the leading cause of preventable death. Quitting smoking is one of the best things that you can do for your health. When you decide to quit smoking, create a plan to help you succeed. Quit smoking right away, not slowly over a period of time. When you start quitting, seek help from your health care provider, family, or friends. This information is not intended to replace advice given to you by your health care provider. Make  sure you discuss any questions you have with your healthcare provider. Document Revised: 01/04/2019 Document Reviewed: 06/30/2018 Elsevier Patient Education  2022 [employer].

## 2020-10-16 ENCOUNTER — Telehealth: Payer: Self-pay

## 2020-10-16 LAB — URINALYSIS, ROUTINE W REFLEX MICROSCOPIC
Bilirubin Urine: NEGATIVE
Glucose, UA: NEGATIVE
Hgb urine dipstick: NEGATIVE
Ketones, ur: NEGATIVE
Leukocytes,Ua: NEGATIVE
Nitrite: NEGATIVE
Protein, ur: NEGATIVE
Specific Gravity, Urine: 1.015 (ref 1.001–1.035)
pH: 5.5 (ref 5.0–8.0)

## 2020-10-16 LAB — COMPLETE METABOLIC PANEL WITH GFR
AG Ratio: 1.4 (calc) (ref 1.0–2.5)
ALT: 7 U/L (ref 6–29)
AST: 11 U/L (ref 10–35)
Albumin: 4 g/dL (ref 3.6–5.1)
Alkaline phosphatase (APISO): 71 U/L (ref 37–153)
BUN: 16 mg/dL (ref 7–25)
CO2: 24 mmol/L (ref 20–32)
Calcium: 9.5 mg/dL (ref 8.6–10.4)
Chloride: 102 mmol/L (ref 98–110)
Creat: 0.8 mg/dL (ref 0.50–1.05)
GFR, Est African American: 96 mL/min/{1.73_m2} (ref >=60)
GFR, Est Non African American: 82 mL/min/{1.73_m2} (ref >=60)
Globulin: 2.8 g/dL (calc) (ref 1.9–3.7)
Glucose, Bld: 191 mg/dL — ABNORMAL HIGH (ref 65–99)
Potassium: 4.5 mmol/L (ref 3.5–5.3)
Sodium: 136 mmol/L (ref 135–146)
Total Bilirubin: 0.3 mg/dL (ref 0.2–1.2)
Total Protein: 6.8 g/dL (ref 6.1–8.1)

## 2020-10-16 LAB — CBC WITH DIFFERENTIAL/PLATELET
Absolute Monocytes: 455 cells/uL (ref 200–950)
Basophils Absolute: 28 cells/uL (ref 0–200)
Basophils Relative: 0.4 %
Eosinophils Absolute: 154 cells/uL (ref 15–500)
Eosinophils Relative: 2.2 %
HCT: 45.7 % — ABNORMAL HIGH (ref 35.0–45.0)
Hemoglobin: 15.4 g/dL (ref 11.7–15.5)
Lymphs Abs: 2009 cells/uL (ref 850–3900)
MCH: 28.3 pg (ref 27.0–33.0)
MCHC: 33.7 g/dL (ref 32.0–36.0)
MCV: 83.9 fL (ref 80.0–100.0)
MPV: 10 fL (ref 7.5–12.5)
Monocytes Relative: 6.5 %
Neutro Abs: 4354 cells/uL (ref 1500–7800)
Neutrophils Relative %: 62.2 %
Platelets: 226 10*3/uL (ref 140–400)
RBC: 5.45 10*6/uL — ABNORMAL HIGH (ref 3.80–5.10)
RDW: 12.5 % (ref 11.0–15.0)
Total Lymphocyte: 28.7 %
WBC: 7 10*3/uL (ref 3.8–10.8)

## 2020-10-16 LAB — ANTI-DNA ANTIBODY, DOUBLE-STRANDED: ds DNA Ab: 1 IU/mL

## 2020-10-16 LAB — C3 AND C4
C3 Complement: 154 mg/dL (ref 83–193)
C4 Complement: 48 mg/dL (ref 15–57)

## 2020-10-16 LAB — SEDIMENTATION RATE: Sed Rate: 6 mm/h (ref 0–30)

## 2020-10-16 NOTE — Progress Notes (Signed)
CBC, double-stranded DNA, sed rate, urine test, and complements are normal.  CMP shows elevated glucose level.  Labs do not indicate active disease.  No change in treatment advised.

## 2020-10-16 NOTE — Telephone Encounter (Signed)
Patient advised CBC, double-stranded DNA, sed rate, urine test, and complements are normal.  CMP shows elevated glucose level.  Labs do not indicate active disease.  No change in treatment advised.

## 2020-10-16 NOTE — Telephone Encounter (Signed)
Patient left a voicemail requesting a return call to discuss labwork results.

## 2020-11-02 ENCOUNTER — Other Ambulatory Visit: Payer: Self-pay | Admitting: *Deleted

## 2020-11-02 MED ORDER — HYDROXYCHLOROQUINE SULFATE 200 MG PO TABS: 200.0000 mg | ORAL_TABLET | Freq: Every day | ORAL | 0 refills | Status: DC

## 2020-11-02 NOTE — Telephone Encounter (Signed)
Last Visit: 10/15/2020  Next Visit: Return in about 5 months (around 03/17/2021). Message sent to the front to schedule.  Labs: 10/15/2020 CBC, double-stranded DNA, sed rate, urine test, and complements are normal. CMP shows elevated glucose level.    Eye exam: scheduled 11/2020   Current Dose per office note 10/15/2020: Plaquenil 200 mg p.o. daily  DX: Other systemic lupus erythematosus with other organ involvement  Last Fill: 09/03/2020  Okay to refill Plaquenil?

## 2020-11-03 ENCOUNTER — Other Ambulatory Visit: Payer: Self-pay | Admitting: Family Medicine

## 2020-11-03 DIAGNOSIS — E1165 Type 2 diabetes mellitus with hyperglycemia: Secondary | ICD-10-CM

## 2020-11-11 ENCOUNTER — Telehealth: Payer: Self-pay

## 2020-11-11 NOTE — Telephone Encounter (Signed)
Patient calls nurse line with concerns regarding Ozempic. Patient reports that she accidentally left medication in the car for approx 24 hours. Patient contacted pharmacy who instructed her to reach out to PCP office.   Please advise how patient should proceed.   Veronda Prude, RN

## 2020-11-12 MED ORDER — OZEMPIC (1 MG/DOSE) 4 MG/3ML ~~LOC~~ SOPN
1.0000 mg | PEN_INJECTOR | SUBCUTANEOUS | 0 refills | Status: DC
Start: 2020-11-12 — End: 2020-11-30

## 2020-11-12 NOTE — Telephone Encounter (Signed)
Called patient to discuss issue with Ozempic being exposed to heat. Patient reports Ozempic was in her car for >24 hours. Can assume temperature in car exceeded room temperature of 86 degrees farenheit therefore discussed with patient she should not take that medication any further. She reports taking one dose on Monday before forgetting it in her car.  Patient has ability to titrate up dose of medication due to tolerating Ozempic 0.5mg  well and being close, but slightly above A1C goal. Benefits of titration of medication also may include weight loss.  Will send in new prescription for Ozempic 1mg  once weekly on Mondays. Patient to return to clinic in two weeks to discuss tolerability and obtain new A1C.

## 2020-11-25 ENCOUNTER — Ambulatory Visit: Payer: Medicare Other | Admitting: Pharmacist

## 2020-11-26 ENCOUNTER — Ambulatory Visit: Payer: Medicare Other | Admitting: Pharmacist

## 2020-11-26 NOTE — Progress Notes (Deleted)
Subjective:    Patient ID: [name], female    DOB: 11-Feb-1966, 55 y.o.   MRN: [id number]  HPI Patient is a 55 y.o. female who presents for diabetes management. She is in *** spirits and presents {w-w/o:315700} assistance. Patient was referred on *** and last seen by Primary Care Provider on ***  PMH significant for ***.   Patient reports diabetes was diagnosed in ***.   Insurance coverage/medication affordability: ***  Family/Social history: ***  Current diabetes medications include: *** Current hypertension medications include: *** Current hyperlipidemia medications include: *** Patient states that {He/she (caps):30048} {Is/is not:9024} taking {his/her/their:21314} medications as prescribed. Patient {Actions; denies-reports:120008} adherence with medications. Patient states that {He/she (caps):30048} misses {his/her/their:21314} medications *** times per week, on average.  Do you feel that your medications are working for you?  {YES NO:22349}  Have you been experiencing any side effects to the medications prescribed? {YES NO:22349}  Do you have any problems obtaining medications due to transportation or finances?  {YES [id number]     Patient reported dietary habits:  Eats *** meals/day and *** snacks/day; Boluses with *** meals/day and *** snacks/day Breakfast:*** Lunch:*** Dinner:*** Snacks:*** Drinks:***  Patient-reported exercise habits: ***   Patient {Actions; denies-reports:120008} hypoglycemic events. Patient {Actions; denies-reports:120008} polyuria (increased urination).  Patient {Actions; denies-reports:120008} polyphagia (increased appetite).  Patient {Actions; denies-reports:120008} polydipsia (increased thirst).  Patient {Actions; denies-reports:120008} neuropathy (nerve pain). Patient {Actions; denies-reports:120008} visual changes. Patient {Actions; denies-reports:120008} self foot exams.   Home fasting blood sugars: ***  2 hour post-meal/random blood  sugars: ***  Objective:   Labs:   Physical Exam  ROS  Lab Results  Component Value Date   HGBA1C 7.2 (A) 08/14/2020   HGBA1C 7.6 (A) 03/30/2020   HGBA1C 8.4 (A) 06/19/2019    There were no vitals filed for this visit.  Lab Results  Component Value Date   MICRALBCREAT 9 01/16/2019    Lipid Panel     Component Value Date/Time   CHOL 236 (H) 06/19/2019 1517   TRIG 127 06/19/2019 1517   HDL 59 06/19/2019 1517   CHOLHDL 4.0 06/19/2019 1517   CHOLHDL 3.4 06/24/2014 1149   VLDL 24 06/24/2014 1149   LDLCALC 154 (H) 06/19/2019 1517    Clinical Atherosclerotic Cardiovascular Disease (ASCVD): {YES/NO:21197} The 10-year ASCVD risk score Denman George DC Jr., et al., 2013) is: 39.7%   Values used to calculate the score:     Age: 33 years     Sex: Female     Is Non-Hispanic African American: Yes     Diabetic: Yes     Tobacco smoker: Yes     Systolic Blood Pressure: 168 mmHg     Is BP treated: No     HDL Cholesterol: 59 mg/dL     Total Cholesterol: 236 mg/dL   PHQ-9 Score: ***  Assessment/Plan:   ***T1/T2DM {ACTION; IS/IS TAV:[id number]} controlled likely due to ***. Medication adherence appears ***. Additional pharmacotherapy {ACTION; IS/IS KPV:[id number]}. Patient with a history of intolerance to ***. Will initiate ***. Benefits of medication include ***. Patient educated on purpose, proper use and potential adverse effects of ***.  Following instruction patient verbalized understanding of treatment plan.    {Meds adjust:18428} basal insulin *** (insulin ***). Patient will continue to titrate 1 unit every *** days if fasting blood sugar > 100mg /dl until fasting blood sugars reach goal or next visit. {Meds adjust:18428}  rapid insulin *** (insulin ***) to ***.  {Meds adjust:18428} GLP-1 *** (generic name***) to ***.  {Meds adjust:18428}  SGLT2-I *** (generic name***) to ***. Counseled on sick day rules for ***. Extensively discussed pathophysiology of diabetes, dietary effects on  blood sugar control, and recommended lifestyle interventions,  Patient will adhere to dietary modifications Patient will exercise *** with goal to increase towards target of at least 150 minutes of moderate intensity exercise weekly Counseled on s/sx of and management of hypoglycemia Next A1C anticipated ***.   ASCVD risk - primary***secondary prevention in patient with diabetes. Last LDL {Is/is not:9024} controlled. ASCVD risk score {Is/is not:9024} >20%  - {Desc; low/moderate/high:110033} intensity statin indicated. Aspirin {Is/is not:9024} indicated.   {Meds adjust:18428} aspirin *** mg  {Meds adjust:18428} ***statin *** mg.   Hypertension longstanding*** currently ***.  Blood pressure goal = *** mmHg. Medication adherence ***.  Blood pressure control is suboptimal due to ***.  *** Extensively discussed pathophysiology of blood pressure, dietary effects on blood pressure control, and recommended lifestyle interventions  Follow-up appointment *** to review sugar readings. Written patient instructions provided.  This appointment required *** minutes of patient care (this includes precharting, chart review, review of results, and face-to-face care).  Thank you for involving pharmacy to assist in providing this patient's care.  Patient seen with ***

## 2020-11-28 ENCOUNTER — Other Ambulatory Visit: Payer: Self-pay | Admitting: Family Medicine

## 2020-11-28 DIAGNOSIS — E1165 Type 2 diabetes mellitus with hyperglycemia: Secondary | ICD-10-CM

## 2020-11-30 ENCOUNTER — Other Ambulatory Visit: Payer: Self-pay

## 2020-11-30 ENCOUNTER — Ambulatory Visit: Payer: Medicare Other | Admitting: Pharmacist

## 2020-11-30 ENCOUNTER — Other Ambulatory Visit: Payer: Self-pay | Admitting: Family Medicine

## 2020-11-30 ENCOUNTER — Ambulatory Visit (INDEPENDENT_AMBULATORY_CARE_PROVIDER_SITE_OTHER): Payer: Medicare Other | Admitting: Pharmacist

## 2020-11-30 DIAGNOSIS — E1165 Type 2 diabetes mellitus with hyperglycemia: Secondary | ICD-10-CM | POA: Diagnosis not present

## 2020-11-30 MED ORDER — OZEMPIC (1 MG/DOSE) 4 MG/3ML ~~LOC~~ SOPN: 2.0000 mg | PEN_INJECTOR | SUBCUTANEOUS | 6 refills | Status: DC

## 2020-11-30 NOTE — Patient Instructions (Signed)
[name] it was a pleasure seeing you today.   Please do the following:  I will message Dr. Neita Garnet to send in a new prescription of Ozempic 2mg  as directed today during your appointment. If you have any questions or if you believe something has occurred because of this change, call me or your doctor to let one of know. If you cannot obtain the medicine please call clinic and let us know Continue checking blood sugars at home. It's really important that you record these and bring these in to your next doctor's appointment.  Continue making the lifestyle changes we've discussed together during our visit. Diet and exercise play a significant role in improving your blood sugars.  Follow-up with Dr. [place] in one month.    Hypoglycemia or low blood sugar:   Low blood sugar can happen quickly and may become an emergency if not treated right away.   While this shouldn't happen often, it can be brought upon if you skip a meal or do not eat enough. Also, if your insulin or other diabetes medications are dosed too high, this can cause your blood sugar to go to low.   Warning signs of low blood sugar include: Feeling shaky or dizzy Feeling weak or tired  Excessive hunger Feeling anxious or upset  Sweating even when you aren't exercising  What to do if I experience low blood sugar? Follow the Rule of 15 Check your blood sugar with your meter. If lower than 70, proceed to step 2.  Treat with 15 grams of fast acting carbs which is found in 3-4 glucose tablets. If none are available you can try hard candy, 1 tablespoon of sugar or honey,4 ounces of fruit juice, or 6 ounces of REGULAR soda.  Re-check your sugar in 15 minutes. If it is still below 70, do what you did in step 2 again. If your blood sugar has come back up, go ahead and eat a snack or small meal made up of complex carbs (ex. Whole grains) and protein at this time to avoid recurrence of low blood sugar.

## 2020-11-30 NOTE — Progress Notes (Signed)
Subjective:    Patient ID: [name], female    DOB: 10/25/1965, 55 y.o.   MRN: [id number]  HPI Patient is a 55 y.o. female who presents for diabetes management and Ozempic tolerability. She is in good spirits and presents without assistance. Patient was referred on 11/11/20 and last seen by provider, Dr. Rachael Weeks, on 08/14/20.  About 2 1/2 weeks ago patient left Ozempic 0.5mg  pens in her car for >24 hours and was instructed to toss medication and titrate up to Ozempic 1mg  once weekly. Patient reports having taken 2 or 3 doses of Ozempic 1mg  with no side effects.  Insurance coverage/medication affordability:  Current diabetes medications include: semaglutide (Ozempic) 1mg  once weekly on Mondays, metformin 500mg  daily Current hyperlipidemia medications include: atorvastatin 20mg  Patient states that She is taking her medications as prescribed. Patient reports adherence with medications but on occasionally will miss dose of medications.  Do you feel that your medications are working for you?  yes  Have you been experiencing any side effects to the medications prescribed? no  Do you have any problems obtaining medications due to transportation or finances?  Yes; ozempic is on backorder   Patient denies hypoglycemic events. Patient denies polyuria (increased urination).  Patient denies polyphagia (increased appetite).  Patient denies polydipsia (increased thirst).  Patient denies neuropathy (nerve pain). Patient denies visual changes. Patient reports self foot exams.   Patient checks her blood glucose 4-5x/day. Patient is aware she does not need to check this often likes to. Reports fasting blood glucose typically in the 100's and post-prandial blood glucose between 100-200's. She states it is usually in the 100's but from time to time she will eat something sweet and she knows it will bring her sugar up.  Objective:   Labs:   Physical Exam Neurological:      Mental Status: She is alert and oriented to person, place, and time.    Review of Systems  Eyes:  Negative for blurred vision.  Gastrointestinal:  Negative for abdominal pain, nausea and vomiting.   Lab Results  Component Value Date   HGBA1C 7.2 (A) 08/14/2020   HGBA1C 7.6 (A) 03/30/2020   HGBA1C 8.4 (A) 06/19/2019   Last Weight  Most recent update: 11/30/2020  1:41 PM    Weight  93.9 kg (207 lb)              Lab Results  Component Value Date   MICRALBCREAT 9 01/16/2019    Lipid Panel     Component Value Date/Time   CHOL 236 (H) 06/19/2019 1517   TRIG 127 06/19/2019 1517   HDL 59 06/19/2019 1517   CHOLHDL 4.0 06/19/2019 1517   CHOLHDL 3.4 06/24/2014 1149   VLDL 24 06/24/2014 1149   LDLCALC 154 (H) 06/19/2019 1517    Clinical Atherosclerotic Cardiovascular Disease (ASCVD): No  The 10-year ASCVD risk score 06/21/2019 DC Jr., et al., 2013) is: 39.7%   Values used to calculate the score:     Age: 59 years     Sex: Female     Is Non-Hispanic African American: Yes     Diabetic: Yes     Tobacco smoker: Yes     Systolic Blood Pressure: 168 mmHg     Is BP treated: No     HDL Cholesterol: 59 mg/dL     Total Cholesterol: 236 mg/dL   Assessment/Plan:   08/24/2014 is mostly controlled. Medication adherence appears optimal . Additional pharmacotherapy is not warranted. Will plan  to continue to titrate patient up to maximum dose Ozempic 2mg  once weekly for further glycemic control and potential weight loss benefits. Following instruction patient verbalized understanding of treatment plan.    Continued GLP-1 semaglutide (Ozempic) 1mg  once weekly. Will send message to PCP regarding sending in prescription for Ozempic 2mg  once weekly and following up with patient. Counseled on s/sx of and management of hypoglycemia Patient due for A1C but will defer to appointment on September with PCP for A1C and lipid check.  Follow-up appointment with PCP within next few weeks to review sugar  readings. Written patient instructions provided.  This appointment required 30 minutes of direct patient care.  Thank you for involving pharmacy to assist in providing this patient's care.

## 2020-11-30 NOTE — Assessment & Plan Note (Signed)
T2DM is mostly controlled. Medication adherence appears optimal . Additional pharmacotherapy is not warranted. Will plan to continue to titrate patient up to maximum dose Ozempic 2mg  once weekly for further glycemic control and potential weight loss benefits. Following instruction patient verbalized understanding of treatment plan.    1. Continued GLP-1 semaglutide (Ozempic) 1mg  once weekly. Will send message to PCP regarding sending in prescription for Ozempic 2mg  once weekly and following up with patient. 2. Counseled on s/sx of and management of hypoglycemia 3. Patient due for A1C but will defer to appointment on September with PCP for A1C and lipid check.  Follow-up appointment with PCP within next few weeks to review sugar readings. Written patient instructions provided.

## 2020-12-01 ENCOUNTER — Other Ambulatory Visit: Payer: Self-pay | Admitting: Family Medicine

## 2020-12-01 DIAGNOSIS — E1165 Type 2 diabetes mellitus with hyperglycemia: Secondary | ICD-10-CM

## 2020-12-03 ENCOUNTER — Encounter: Payer: Self-pay | Admitting: Family Medicine

## 2020-12-03 ENCOUNTER — Other Ambulatory Visit: Payer: Self-pay | Admitting: Family Medicine

## 2020-12-03 MED ORDER — OZEMPIC (1 MG/DOSE) 4 MG/3ML ~~LOC~~ SOPN: 2.0000 mg | PEN_INJECTOR | SUBCUTANEOUS | 6 refills | Status: DC

## 2020-12-11 NOTE — Telephone Encounter (Signed)
Patient calls nurse line requesting a 3 month supply pf Ozempic. Patient reports a 3 month supply is the same price as a one month. Patient has not picked up original one month. Please advise.

## 2020-12-12 ENCOUNTER — Other Ambulatory Visit: Payer: Self-pay | Admitting: Family Medicine

## 2020-12-12 MED ORDER — OZEMPIC (1 MG/DOSE) 4 MG/3ML ~~LOC~~ SOPN: 2.0000 mg | PEN_INJECTOR | SUBCUTANEOUS | 3 refills | Status: DC

## 2020-12-14 ENCOUNTER — Other Ambulatory Visit: Payer: Self-pay | Admitting: Family Medicine

## 2020-12-15 ENCOUNTER — Telehealth: Payer: Self-pay

## 2020-12-15 NOTE — Telephone Encounter (Signed)
Received fax from pharmacy, PA needed on Ozempic. Clinical questions submitted via Cover My Meds. Waiting on response, could take up to 72 hours.  Cover My Meds info: Key:  [id number]

## 2020-12-22 NOTE — Telephone Encounter (Signed)
Medication should be covered by insurance. I called patient to see if she has any issues picking up medication, however no answer or option for VM.

## 2020-12-23 NOTE — Progress Notes (Signed)
    SUBJECTIVE:   CHIEF COMPLAINT / HPI: DM  Decreased Libido  Patient reports she has been in a happy and healthy relationship but feels that now that her health concerns are calming down, she would like to explore why she has decreased sex drive. Patient states that she desires intercourse but does not have the motivation to proceed when the opportunity presents itself.   T2DM Patient recently prescribed increased dose of GLP-1 injectable.  Patient reports that she has been able to receive this prescription but prefers to have 90 day supply due to cost efficiency.  She reports that her blood glucose measurements range between 140-170.  She is agreeable to repeat A1c today. She reports that she was unable to take her ozempic for several weeks due to insurance issues w/ cost.  Lab Results  Component Value Date   HGBA1C 8.0 (A) 12/24/2020   PERTINENT  PMH / PSH:  DM  OBJECTIVE:   BP 128/86   Pulse 87   Wt 206 lb (93.4 kg)   SpO2 100%   BMI 37.08 kg/m   General: female appearing stated age in no acute distress Cardio: Normal S1 and S2, no S3 or S4. Rhythm is regular.  Pulm: Clear to auscultation bilaterally, no crackles, wheezing, or diminished breath sounds. Normal respiratory effort, stable on RA Abdomen: Bowel sounds normal. Abdomen soft and non-tender.  Extremities: trace peripheral edema. Warm/ well perfused.   ASSESSMENT/PLAN:   Type 2 diabetes mellitus, uncontrolled (HCC) suppled 90 day script for Ozempic  A1c collected today   Decreased libido without sexual dysfunction Will obtain CBC and TSH to begin evaluation  Patient to follow up in 2-4 weeks      Ronnald Ramp, MD [facility]

## 2020-12-24 ENCOUNTER — Other Ambulatory Visit: Payer: Self-pay

## 2020-12-24 ENCOUNTER — Encounter: Payer: Self-pay | Admitting: Family Medicine

## 2020-12-24 ENCOUNTER — Ambulatory Visit (INDEPENDENT_AMBULATORY_CARE_PROVIDER_SITE_OTHER): Payer: Medicare Other | Admitting: Family Medicine

## 2020-12-24 VITALS — BP 128/86 | HR 87 | Wt 206.0 lb

## 2020-12-24 DIAGNOSIS — R6882 Decreased libido: Secondary | ICD-10-CM | POA: Diagnosis not present

## 2020-12-24 DIAGNOSIS — E1165 Type 2 diabetes mellitus with hyperglycemia: Secondary | ICD-10-CM

## 2020-12-24 DIAGNOSIS — F529 Unspecified sexual dysfunction not due to a substance or known physiological condition: Secondary | ICD-10-CM

## 2020-12-24 LAB — POCT GLYCOSYLATED HEMOGLOBIN (HGB A1C): HbA1c, POC (controlled diabetic range): 8 % — AB (ref 0.0–7.0)

## 2020-12-24 MED ORDER — OZEMPIC (1 MG/DOSE) 4 MG/3ML ~~LOC~~ SOPN: 2.0000 mg | PEN_INJECTOR | SUBCUTANEOUS | 3 refills | Status: DC

## 2020-12-24 NOTE — Patient Instructions (Signed)
Today we will completed some blood work to check your blood cell counts and thyroid and if it may related to your decreased sex drive. I will follow up with you once results are available.   I have prescribed your ozempic. Please continue that along with your metformin as prescribed. You will be increased to 2mg  of the ozempic weekly.    Please follow up with me as scheduled to further discuss diabetes and recommendations for sex drive.

## 2020-12-29 DIAGNOSIS — R6882 Decreased libido: Secondary | ICD-10-CM | POA: Insufficient documentation

## 2020-12-29 MED ORDER — OZEMPIC (1 MG/DOSE) 4 MG/3ML ~~LOC~~ SOPN: 2.0000 mg | PEN_INJECTOR | SUBCUTANEOUS | 1 refills | Status: DC

## 2020-12-29 NOTE — Assessment & Plan Note (Signed)
Will obtain CBC and TSH to begin evaluation  Patient to follow up in 2-4 weeks

## 2020-12-29 NOTE — Assessment & Plan Note (Signed)
suppled 90 day script for Ozempic  A1c collected today

## 2021-01-03 ENCOUNTER — Other Ambulatory Visit: Payer: Self-pay | Admitting: Physician Assistant

## 2021-01-14 ENCOUNTER — Ambulatory Visit: Payer: Medicare Other | Admitting: Family Medicine

## 2021-01-26 ENCOUNTER — Other Ambulatory Visit: Payer: Self-pay | Admitting: Family Medicine

## 2021-02-12 ENCOUNTER — Other Ambulatory Visit: Payer: Self-pay

## 2021-02-12 DIAGNOSIS — E1165 Type 2 diabetes mellitus with hyperglycemia: Secondary | ICD-10-CM

## 2021-02-12 MED ORDER — ONETOUCH VERIO VI STRP: ORAL_STRIP | 3 refills | Status: DC

## 2021-02-21 ENCOUNTER — Other Ambulatory Visit: Payer: Self-pay | Admitting: Family Medicine

## 2021-02-22 ENCOUNTER — Ambulatory Visit (INDEPENDENT_AMBULATORY_CARE_PROVIDER_SITE_OTHER): Payer: Medicare Other | Admitting: Family Medicine

## 2021-02-22 ENCOUNTER — Other Ambulatory Visit: Payer: Self-pay

## 2021-02-22 VITALS — BP 150/73 | HR 81 | Temp 99.3°F | Ht 62.5 in

## 2021-02-22 DIAGNOSIS — R0602 Shortness of breath: Secondary | ICD-10-CM | POA: Diagnosis not present

## 2021-02-22 DIAGNOSIS — R509 Fever, unspecified: Secondary | ICD-10-CM

## 2021-02-22 DIAGNOSIS — M3219 Other organ or system involvement in systemic lupus erythematosus: Secondary | ICD-10-CM | POA: Diagnosis not present

## 2021-02-22 MED ORDER — ALBUTEROL SULFATE HFA 108 (90 BASE) MCG/ACT IN AERS: 2.0000 | INHALATION_SPRAY | Freq: Four times a day (QID) | RESPIRATORY_TRACT | 0 refills | Status: DC | PRN

## 2021-02-22 NOTE — Patient Instructions (Signed)
We are going to get blood work and chest x-rays today.  We will call or send you a MyChart message about the results if there is abnormality.  There is consideration that this could be due to some viral illnesses picked up from your sister's job, but do not want to miss anything.  It is possible that there is underlying COPD that could be contributing to this or also related to your lupus.  I want you to come back for follow-up within the next couple of days if your labs are normal and we will see if we need to do further work-up.

## 2021-02-22 NOTE — Progress Notes (Signed)
    SUBJECTIVE:   CHIEF COMPLAINT / HPI:   Prolonged illness Patient reports that she has had intermittent sickness for the last 6 weeks.  She describes that initially she began having congestion with cough and myalgias.  At the time, she had had a loss of appetite and a decrease in weight by about 6-7 pounds.  In the last 10 to 14 days she began having fevers (T-max was 101 F) with increased sputum production (initially with color change but now white), right-sided chest pain with her coughing that is not present at rest.  She has taken 3 COVID tests during her illness and has been negative each time.  She does not currently go out much herself but does note that her sister works at a high school and is constantly bringing viruses home as well.    Currently smokes 3/4 PPD some days, was decreasing.   PERTINENT  PMH / PSH: Lupus  OBJECTIVE:   BP (!) 150/73   Pulse 81   Temp 99.3 F (37.4 C) (Oral)   Ht 5' 2.5" (1.588 m)   BMI 37.08 kg/m   Gen: well-appearing, NAD CV: RRR, no m/r/g appreciated, no peripheral edema Pulm: CTAB, no wheezes/crackles, difficulty with taking deep respirations, breathing heavily at rest (though not tachypneic) GI: soft, non-tender, non-distended  ASSESSMENT/PLAN:   Prolonged illness Patient with 6 weeks of prolonged illness that has had 2 distinct symptom patterns with intermittent times of feeling better (though not baseline). Initially patient was having congestion with body aches and decreased appetite with subsequent loss of 6-7lbs; in the last 2 weeks she has begun to have fevers with increased sputum production and cough with right sided-chest pain. Patient's sister works at a high-school, which could be a source of viruses that may have been causing different infections in the patient but the timeline seems prolonged. She has tested negative for COVID 3 times. We will check a CBC with differential, BMP, and CXR to ensure that patient does not have  concerns for a pneumonia.  - There is consideration that patient also has a component of COPD that may be exacerbating whatever respiratory features she is having as she is a chronic smoker, prescribed an albuterol inhaler to trial if there is improvement in symptoms -- if there is, we may need to consider PFT testing in the future.  - Patient has a history of Lupus, which makes her immunocompromised but there is concern for possible Lupus flare as that can also be accompanied by fevers.  The timing would make sense if she had an illness initially which triggered a flare.  If her labs and imaging come back without any indicators of infection, patient may need to have complement levels (C3 and C4) and anti-dsDNA antibodies. If it is a flare, would anticipate a decrease in complement and increase in antibody titers.   Evelena Leyden, DO [facility]

## 2021-02-24 LAB — CBC WITH DIFFERENTIAL/PLATELET
Basophils Absolute: 0 10*3/uL (ref 0.0–0.2)
Basos: 1 %
EOS (ABSOLUTE): 0.2 10*3/uL (ref 0.0–0.4)
Eos: 3 %
Hematocrit: 46.5 % (ref 34.0–46.6)
Hemoglobin: 15.3 g/dL (ref 11.1–15.9)
Immature Grans (Abs): 0 10*3/uL (ref 0.0–0.1)
Immature Granulocytes: 0 %
Lymphocytes Absolute: 2.1 10*3/uL (ref 0.7–3.1)
Lymphs: 29 %
MCH: 28.2 pg (ref 26.6–33.0)
MCHC: 32.9 g/dL (ref 31.5–35.7)
MCV: 86 fL (ref 79–97)
Monocytes Absolute: 0.6 10*3/uL (ref 0.1–0.9)
Monocytes: 8 %
Neutrophils Absolute: 4.4 10*3/uL (ref 1.4–7.0)
Neutrophils: 59 %
Platelets: 237 10*3/uL (ref 150–450)
RBC: 5.43 x10E6/uL — ABNORMAL HIGH (ref 3.77–5.28)
RDW: 12.7 % (ref 11.7–15.4)
WBC: 7.3 10*3/uL (ref 3.4–10.8)

## 2021-02-24 LAB — BASIC METABOLIC PANEL
BUN/Creatinine Ratio: 18 (ref 9–23)
BUN: 14 mg/dL (ref 6–24)
CO2: 24 mmol/L (ref 20–29)
Calcium: 9.5 mg/dL (ref 8.7–10.2)
Chloride: 103 mmol/L (ref 96–106)
Creatinine, Ser: 0.8 mg/dL (ref 0.57–1.00)
Glucose: 172 mg/dL — ABNORMAL HIGH (ref 70–99)
Potassium: 4.9 mmol/L (ref 3.5–5.2)
Sodium: 139 mmol/L (ref 134–144)
eGFR: 86 mL/min/{1.73_m2} (ref >59)

## 2021-02-25 ENCOUNTER — Ambulatory Visit: Payer: Medicare Other

## 2021-03-05 ENCOUNTER — Encounter: Payer: Self-pay | Admitting: Family Medicine

## 2021-03-08 ENCOUNTER — Other Ambulatory Visit: Payer: Self-pay | Admitting: Family Medicine

## 2021-03-08 ENCOUNTER — Telehealth: Payer: Self-pay

## 2021-03-08 MED ORDER — OZEMPIC (1 MG/DOSE) 4 MG/3ML ~~LOC~~ SOPN: 1.0000 mg | PEN_INJECTOR | SUBCUTANEOUS | 3 refills | Status: DC

## 2021-03-08 MED ORDER — OZEMPIC (1 MG/DOSE) 4 MG/3ML ~~LOC~~ SOPN: 2.0000 mg | PEN_INJECTOR | SUBCUTANEOUS | 2 refills | Status: DC

## 2021-03-08 NOTE — Telephone Encounter (Signed)
Patient calls nurse line requesting to go back down to Ozempic 1mg . Patient reports Ozempic 2mg  is on back order and she is unable to obtain medication. Patient reports this has been an ongoing issue and wants a medication that she can obtain with less issues. Will forward to PCP for advisement.

## 2021-03-09 ENCOUNTER — Other Ambulatory Visit: Payer: Self-pay | Admitting: Family Medicine

## 2021-03-09 NOTE — Telephone Encounter (Signed)
Medication dose changed to 1mg  and 90 day supply sent to pharmacy.  Discussed plan with pharmacy and recommended that patient speak with insurance company as they limit the amount of coverage per month. Patient recently picked up RX for 60 day supply for this month.   , MD [facility], PGY-3 [phone]

## 2021-03-09 NOTE — Telephone Encounter (Signed)
Called pharmacy as patient sent mychart message that there are still issues with getting Ozempic filled.   Pharmacist reports that when she runs the medication she is getting the response "product/service not covered"  She reports that under patient's medicare part D, medication appears to no longer be covered. States that it is not requiring PA, it is just not covered.   Please advise how patient should proceed.   Veronda Prude, RN

## 2021-03-10 NOTE — Telephone Encounter (Signed)
Called pharmacy and insurance to see what the issue was. Pharmacy was not running the medication under the same NDC it was previously filled under in September. Pt does currently have a partial fill (one month worth) that [facility] provided [facility] while they wait for the rest of the medication to come into stock. They could not give me a copay price until the fill was complete. Pt will most likely qualify for novo nordisk patient assistance as well if price is still an issue.  I sent pt the info on MyChart as well.  Thanks!

## 2021-03-17 ENCOUNTER — Other Ambulatory Visit: Payer: Self-pay | Admitting: Family Medicine

## 2021-03-17 ENCOUNTER — Telehealth: Payer: Self-pay

## 2021-03-17 MED ORDER — MOUNJARO 2.5 MG/0.5ML ~~LOC~~ SOAJ: 2.5000 mg | SUBCUTANEOUS | 1 refills | Status: DC

## 2021-03-17 NOTE — Telephone Encounter (Signed)
Patient returns call to nurse line regarding shortage of Ozempic. Patient is requesting that new rx for [facility] [address]   Please advise.   Veronda Prude, RN

## 2021-03-17 NOTE — Telephone Encounter (Signed)
Pt called about ozempic supply issues at pharmacy. Was told medication would probably not be in until sometime in January. Pharmacy suggested pt change medication to [facility] or they have the lower doses of ozempic available (0.25mg ).   I encouraged pt to call office back and speak with nurse about medication options being this is a holiday weekend and she will be without medication. Her partial supply from pharmacy is gone. I also told pt I would get the info to the pcp as well.

## 2021-03-17 NOTE — Telephone Encounter (Signed)
Mounjaro prescription sent to pharmacy on [address]   Ronnald Ramp, MD [facility], PGY-3 [phone]

## 2021-04-06 ENCOUNTER — Other Ambulatory Visit: Payer: Self-pay | Admitting: Family Medicine

## 2021-04-06 DIAGNOSIS — E1165 Type 2 diabetes mellitus with hyperglycemia: Secondary | ICD-10-CM

## 2021-04-19 ENCOUNTER — Other Ambulatory Visit: Payer: Self-pay | Admitting: Physician Assistant

## 2021-04-20 ENCOUNTER — Encounter: Payer: Self-pay | Admitting: *Deleted

## 2021-04-20 ENCOUNTER — Other Ambulatory Visit: Payer: Self-pay | Admitting: Family Medicine

## 2021-04-20 DIAGNOSIS — E1165 Type 2 diabetes mellitus with hyperglycemia: Secondary | ICD-10-CM

## 2021-04-20 NOTE — Telephone Encounter (Signed)
Last Visit: 10/15/2020   Next Visit: Return in about 5 months (around 03/17/2021). Message sent to the front to schedule.   Labs: 02/22/2021 RBC 5.43 Glucose 172   Eye exam: scheduled 11/2020    Current Dose per office note 10/15/2020: Plaquenil 200 mg p.o. daily   DX: Other systemic lupus erythematosus with other organ involvement   Last Fill: 11/02/2020  Attempted to contact the patient and unable to leave a message. Sent message via my chart.    Okay to refill Plaquenil?

## 2021-05-04 ENCOUNTER — Other Ambulatory Visit: Payer: Self-pay | Admitting: Family Medicine

## 2021-05-10 ENCOUNTER — Other Ambulatory Visit: Payer: Self-pay | Admitting: Family Medicine

## 2021-05-29 ENCOUNTER — Other Ambulatory Visit: Payer: Self-pay | Admitting: Family Medicine

## 2021-05-31 ENCOUNTER — Other Ambulatory Visit: Payer: Self-pay | Admitting: Family Medicine

## 2021-06-15 ENCOUNTER — Other Ambulatory Visit: Payer: Self-pay | Admitting: Physician Assistant

## 2021-06-20 ENCOUNTER — Other Ambulatory Visit: Payer: Self-pay | Admitting: Family Medicine

## 2021-07-06 ENCOUNTER — Encounter (INDEPENDENT_AMBULATORY_CARE_PROVIDER_SITE_OTHER): Payer: Self-pay

## 2021-07-13 ENCOUNTER — Other Ambulatory Visit: Payer: Self-pay | Admitting: Family Medicine

## 2021-08-17 ENCOUNTER — Other Ambulatory Visit: Payer: Self-pay | Admitting: Family Medicine

## 2021-08-17 DIAGNOSIS — E1165 Type 2 diabetes mellitus with hyperglycemia: Secondary | ICD-10-CM

## 2021-09-22 ENCOUNTER — Other Ambulatory Visit: Payer: Self-pay | Admitting: Family Medicine

## 2021-09-28 ENCOUNTER — Encounter: Payer: Self-pay | Admitting: *Deleted

## 2021-10-21 ENCOUNTER — Other Ambulatory Visit: Payer: Self-pay | Admitting: Family Medicine

## 2021-10-21 DIAGNOSIS — E1165 Type 2 diabetes mellitus with hyperglycemia: Secondary | ICD-10-CM

## 2021-10-25 ENCOUNTER — Encounter: Payer: Self-pay | Admitting: Family Medicine

## 2021-10-27 MED ORDER — ONETOUCH VERIO VI STRP: ORAL_STRIP | 3 refills | Status: DC

## 2021-11-12 ENCOUNTER — Telehealth: Payer: Self-pay

## 2021-11-12 NOTE — Telephone Encounter (Signed)
Patient calls nurse line regarding issues with Ozempic.   Patient reports that she just spoke with pharmacy and that medication requires a PA. She states they are sending the paperwork over to our office.   Will forward to [place].   Veronda Prude, RN

## 2021-11-15 ENCOUNTER — Other Ambulatory Visit (HOSPITAL_COMMUNITY): Payer: Self-pay

## 2021-11-15 NOTE — Telephone Encounter (Signed)
Attempted to submit PA, but medication is covered by insurance (should be $0). [facility] [address] [facility], [facility] [address]  [facility], medication is covered but is most likely on backorder. Pharmacy will attempt to order medication for pt although others are waiting for medication as well.  Left pt voicemail on mobile number.

## 2021-11-23 ENCOUNTER — Telehealth: Payer: Self-pay

## 2021-11-23 NOTE — Telephone Encounter (Signed)
Patient LVM on nurse line regarding issues she has been having with getting Ozempic prescription.   Attempted to call patient back. Patient did not answer, LVM asking patient to return call to office.   Veronda Prude, RN

## 2021-11-24 MED ORDER — OZEMPIC (1 MG/DOSE) 4 MG/3ML ~~LOC~~ SOPN: PEN_INJECTOR | SUBCUTANEOUS | 5 refills | Status: DC

## 2021-11-24 NOTE — Telephone Encounter (Signed)
Patient returns calls to nurse line.   Patient needs Ozempic resent to mail order pharmacy. Patient reports they do [facility] stock [facility]   Patient reports she takes her last injection tomorrow. Patient has been on 2mg  dose.   Will forward back to PCP to send in as it has been 1 month since Ozempic was sent in.

## 2021-11-24 NOTE — Addendum Note (Signed)
Addended by: Steva Colder on: 11/24/2021 12:18 PM   Modules accepted: Orders

## 2021-11-24 NOTE — Addendum Note (Signed)
Addended by: Steva Colder on: 11/24/2021 08:57 AM   Modules accepted: Orders

## 2021-11-26 ENCOUNTER — Telehealth: Payer: Self-pay

## 2021-11-26 ENCOUNTER — Other Ambulatory Visit: Payer: Self-pay | Admitting: Student

## 2021-11-26 ENCOUNTER — Encounter: Payer: Self-pay | Admitting: Student

## 2021-11-26 MED ORDER — OZEMPIC (2 MG/DOSE) 8 MG/3ML ~~LOC~~ SOPN: 2.0000 mg | PEN_INJECTOR | SUBCUTANEOUS | 1 refills | Status: DC

## 2021-11-26 NOTE — Telephone Encounter (Signed)
Patient calls nurse line in regards to recent Ozempic prescription.   Patient reports [facility] is holding prescription due to strength and directions.   Patient reports she is injecting 2mg , however th pen was written for 1mg .   Will forward to PCP to send in 2mg  pen injecting 2mg  per week.   Please send to [facility] mail order pharmacy.

## 2021-11-26 NOTE — Progress Notes (Signed)
Prescription of Ozempic sent to mail order pharmacy per pt request.

## 2021-12-01 ENCOUNTER — Other Ambulatory Visit: Payer: Self-pay | Admitting: Family Medicine
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
## 2021-12-03 ENCOUNTER — Other Ambulatory Visit (HOSPITAL_COMMUNITY): Payer: Self-pay

## 2021-12-07 ENCOUNTER — Other Ambulatory Visit (HOSPITAL_COMMUNITY): Payer: Self-pay

## 2021-12-07 ENCOUNTER — Telehealth: Payer: Self-pay | Admitting: Pharmacist

## 2021-12-07 MED ORDER — OZEMPIC (2 MG/DOSE) 8 MG/3ML ~~LOC~~ SOPN
2.0000 mg | PEN_INJECTOR | SUBCUTANEOUS | 5 refills | Status: DC
  Filled 2021-12-07 – 2022-01-07 (×3): qty 3, 28d supply, fill #0

## 2021-12-07 NOTE — Telephone Encounter (Signed)
Prescription request for Ozempic (semaglutide) 2mg .   New prescription sent to alternate pharmacy with supply of this medication which has been in short supply.

## 2021-12-07 NOTE — Telephone Encounter (Signed)
Noted and agree. 

## 2021-12-08 ENCOUNTER — Other Ambulatory Visit (HOSPITAL_COMMUNITY): Payer: Self-pay

## 2021-12-15 ENCOUNTER — Encounter: Payer: Self-pay | Admitting: Rheumatology

## 2021-12-15 ENCOUNTER — Ambulatory Visit: Payer: Medicare Other | Attending: Rheumatology | Admitting: Rheumatology

## 2021-12-15 ENCOUNTER — Telehealth: Payer: Self-pay

## 2021-12-15 VITALS — BP 172/80 | HR 77 | Resp 16 | Ht 62.5 in | Wt 205.6 lb

## 2021-12-15 DIAGNOSIS — E1165 Type 2 diabetes mellitus with hyperglycemia: Secondary | ICD-10-CM

## 2021-12-15 DIAGNOSIS — M19041 Primary osteoarthritis, right hand: Secondary | ICD-10-CM | POA: Diagnosis not present

## 2021-12-15 DIAGNOSIS — R5383 Other fatigue: Secondary | ICD-10-CM

## 2021-12-15 DIAGNOSIS — G8929 Other chronic pain: Secondary | ICD-10-CM | POA: Diagnosis not present

## 2021-12-15 DIAGNOSIS — M5136 Other intervertebral disc degeneration, lumbar region: Secondary | ICD-10-CM

## 2021-12-15 DIAGNOSIS — M19042 Primary osteoarthritis, left hand: Secondary | ICD-10-CM | POA: Diagnosis not present

## 2021-12-15 DIAGNOSIS — I10 Essential (primary) hypertension: Secondary | ICD-10-CM

## 2021-12-15 DIAGNOSIS — M19071 Primary osteoarthritis, right ankle and foot: Secondary | ICD-10-CM | POA: Diagnosis not present

## 2021-12-15 DIAGNOSIS — M19072 Primary osteoarthritis, left ankle and foot: Secondary | ICD-10-CM

## 2021-12-15 DIAGNOSIS — M3219 Other organ or system involvement in systemic lupus erythematosus: Secondary | ICD-10-CM | POA: Diagnosis not present

## 2021-12-15 DIAGNOSIS — M25562 Pain in left knee: Secondary | ICD-10-CM

## 2021-12-15 DIAGNOSIS — Z13820 Encounter for screening for osteoporosis: Secondary | ICD-10-CM

## 2021-12-15 DIAGNOSIS — M25561 Pain in right knee: Secondary | ICD-10-CM

## 2021-12-15 DIAGNOSIS — Z79899 Other long term (current) drug therapy: Secondary | ICD-10-CM

## 2021-12-15 DIAGNOSIS — F17200 Nicotine dependence, unspecified, uncomplicated: Secondary | ICD-10-CM

## 2021-12-15 NOTE — Telephone Encounter (Signed)
We need PLQ eye exam prior to refill per Dr. Corliss Skains.   Patient did not schedule with Dr. Dione Booze on 12/15/2021 when leaving our office. I called [facility] to confirm. I tried to call patient, no answer and no voicemail available.

## 2021-12-15 NOTE — Patient Instructions (Signed)
Hydroxychloroquine Tablets What is this medication? HYDROXYCHLOROQUINE (hye drox ee KLOR oh kwin) treats autoimmune conditions, such as rheumatoid arthritis and lupus. It works by slowing down an overactive immune system. It may also be used to prevent and treat malaria. It works by killing the parasite that causes malaria. It belongs to a group of medications called DMARDs. This medicine may be used for other purposes; ask your health care provider or pharmacist if you have questions. COMMON BRAND NAME(S): Plaquenil, Quineprox What should I tell my care team before I take this medication? They need to know if you have any of these conditions: Diabetes Eye disease, vision problems G6PD deficiency Heart disease History of irregular heartbeat If you often drink alcohol Kidney disease Liver disease Porphyria Psoriasis An unusual or allergic reaction to chloroquine, hydroxychloroquine, other medications, foods, dyes, or preservatives Pregnant or trying to get pregnant Breast-feeding How should I use this medication? Take this medication by mouth with a glass of water. Take it as directed on the prescription label. Do not cut, crush or chew this medication. Swallow the tablets whole. Take it with food. Do not take it more than directed. Take all of this medication unless your care team tells you to stop it early. Keep taking it even if you think you are better. Take products with antacids in them at a different time of day than this medication. Take this medication 4 hours before or 4 hours after antacids. Talk to your care team if you have questions. Talk to your care team about the use of this medication in children. While this medication may be prescribed for selected conditions, precautions do apply. Overdosage: If you think you have taken too much of this medicine contact a poison control center or emergency room at once. NOTE: This medicine is only for you. Do not share this medicine with  others. What if I miss a dose? If you miss a dose, take it as soon as you can. If it is almost time for your next dose, take only that dose. Do not take double or extra doses. What may interact with this medication? Do not take this medication with any of the following: Cisapride Dronedarone Pimozide Thioridazine This medication may also interact with the following: Ampicillin Antacids Cimetidine Cyclosporine Digoxin Kaolin Medications for diabetes, like insulin, glipizide, glyburide Medications for seizures like carbamazepine, phenobarbital, phenytoin Mefloquine Methotrexate Other medications that prolong the QT interval (cause an abnormal heart rhythm) Praziquantel This list may not describe all possible interactions. Give your health care provider a list of all the medicines, herbs, non-prescription drugs, or dietary supplements you use. Also tell them if you smoke, drink alcohol, or use illegal drugs. Some items may interact with your medicine. What should I watch for while using this medication? Visit your care team for regular checks on your progress. Tell your care team if your symptoms do not start to get better or if they get worse. You may need blood work done while you are taking this medication. If you take other medications that can affect heart rhythm, you may need more testing. Talk to your care team if you have questions. Your vision may be tested before and during use of this medication. Tell your care team right away if you have any change in your eyesight. This medication may cause serious skin reactions. They can happen weeks to months after starting the medication. Contact your care team right away if you notice fevers or flu-like symptoms with a rash. The   rash may be red or purple and then turn into blisters or peeling of the skin. Or, you might notice a red rash with swelling of the face, lips or lymph nodes in your neck or under your arms. If you or your family  notice any changes in your behavior, such as new or worsening depression, thoughts of harming yourself, anxiety, or other unusual or disturbing thoughts, or memory loss, call your care team right away. What side effects may I notice from receiving this medication? Side effects that you should report to your care team as soon as possible: Allergic reactions--skin rash, itching, hives, swelling of the face, lips, tongue, or throat Aplastic anemia--unusual weakness or fatigue, dizziness, headache, trouble breathing, increased bleeding or bruising Change in vision Heart rhythm changes--fast or irregular heartbeat, dizziness, feeling faint or lightheaded, chest pain, trouble breathing Infection--fever, chills, cough, or sore throat Low blood sugar (hypoglycemia)--tremors or shaking, anxiety, sweating, cold or clammy skin, confusion, dizziness, rapid heartbeat Muscle injury--unusual weakness or fatigue, muscle pain, dark yellow or brown urine, decrease in amount of urine Pain, tingling, or numbness in the hands or feet Rash, fever, and swollen lymph nodes Redness, blistering, peeling, or loosening of the skin, including inside the mouth Thoughts of suicide or self-harm, worsening mood, or feelings of depression Unusual bruising or bleeding Side effects that usually do not require medical attention (report to your care team if they continue or are bothersome): Diarrhea Headache Nausea Stomach pain Vomiting This list may not describe all possible side effects. Call your doctor for medical advice about side effects. You may report side effects to FDA at 1-800-FDA-1088. Where should I keep my medication? Keep out of the reach of children and pets. Store at room temperature up to 30 degrees C (86 degrees F). Protect from light. Get rid of any unused medication after the expiration date. To get rid of medications that are no longer needed or have expired: Take the medication to a medication take-back  program. Check with your pharmacy or law enforcement to find a location. If you cannot return the medication, check the label or package insert to see if the medication should be thrown out in the garbage or flushed down the toilet. If you are not sure, ask your care team. If it is safe to put it in the trash, empty the medication out of the container. Mix the medication with cat litter, dirt, coffee grounds, or other unwanted substance. Seal the mixture in a bag or container. Put it in the trash. NOTE: This sheet is a summary. It may not cover all possible information. If you have questions about this medicine, talk to your doctor, pharmacist, or health care provider.  2023 Elsevier/Gold Standard (2020-07-29 00:00:00)  Standing Labs We placed an order today for your standing lab work.   Please have your standing labs drawn in November  If possible, please have your labs drawn 2 weeks prior to your appointment so that the provider can discuss your results at your appointment.  Please note that you may see your imaging and lab results in MyChart before we have reviewed them. We may be awaiting multiple results to interpret others before contacting you. Please allow our office up to 72 hours to thoroughly review all of the results before contacting the office for clarification of your results.  We currently have open lab daily: Monday through Thursday from 1:30 PM-4:30 PM and Friday from 1:30 PM- 4:00 PM If possible, please come for your lab work  on Monday, Thursday or Friday afternoons, as you may experience shorter wait times.   Effective February 23, 2022 the new lab hours will change to: Monday through Thursday from 1:30 PM-5:00 PM and Friday from 8:30 AM-12:00 PM If possible, please come for your lab work on Monday and Thursday afternoons, as you may experience shorter wait times.  Please be advised, all patients with office appointments requiring lab work will take precedent over walk-in  lab work.    The office is located at [address], [place], [place] [address] No appointment is necessary.   Labs are drawn by [facility]. Please bring your co-pay at the time of your lab draw.  You may receive a bill from [facility] for your lab work.  Please note if you are on Hydroxychloroquine and and an order has been placed for a Hydroxychloroquine level, you will need to have it drawn 4 hours or more after your last dose.  If you wish to have your labs drawn at another location, please call the office 24 hours in advance to send orders.  If you have any questions regarding directions or hours of operation,  please call [phone].   As a reminder, please drink plenty of water prior to coming for your lab work. Thanks!   Vaccines You are taking a medication(s) that can suppress your immune system.  The following immunizations are recommended: Flu annually Covid-19  Td/Tdap (tetanus, diphtheria, pertussis) every 10 years Pneumonia (Prevnar 15 then Pneumovax 23 at least 1 year apart.  Alternatively, can take Prevnar 20 without needing additional dose) Shingrix: 2 doses from 4 weeks to 6 months apart  Please check with your PCP to make sure you are up to date.

## 2021-12-16 ENCOUNTER — Encounter: Payer: Self-pay | Admitting: *Deleted

## 2021-12-16 LAB — RHEUMATOID FACTOR: Rheumatoid fact SerPl-aCnc: <14 IU/mL (ref <14)

## 2021-12-16 LAB — CBC WITH DIFFERENTIAL/PLATELET
Absolute Monocytes: 508 cells/uL (ref 200–950)
Basophils Absolute: 20 cells/uL (ref 0–200)
Basophils Relative: 0.3 %
Eosinophils Absolute: 172 cells/uL (ref 15–500)
Eosinophils Relative: 2.6 %
HCT: 46.5 % — ABNORMAL HIGH (ref 35.0–45.0)
Hemoglobin: 15.9 g/dL — ABNORMAL HIGH (ref 11.7–15.5)
Lymphs Abs: 1921 cells/uL (ref 850–3900)
MCH: 29.6 pg (ref 27.0–33.0)
MCHC: 34.2 g/dL (ref 32.0–36.0)
MCV: 86.6 fL (ref 80.0–100.0)
MPV: 10.4 fL (ref 7.5–12.5)
Monocytes Relative: 7.7 %
Neutro Abs: 3980 cells/uL (ref 1500–7800)
Neutrophils Relative %: 60.3 %
Platelets: 196 10*3/uL (ref 140–400)
RBC: 5.37 10*6/uL — ABNORMAL HIGH (ref 3.80–5.10)
RDW: 12.5 % (ref 11.0–15.0)
Total Lymphocyte: 29.1 %
WBC: 6.6 10*3/uL (ref 3.8–10.8)

## 2021-12-16 LAB — ANA: Anti Nuclear Antibody (ANA): NEGATIVE

## 2021-12-16 LAB — COMPLETE METABOLIC PANEL WITH GFR
AG Ratio: 1.3 (calc) (ref 1.0–2.5)
ALT: 8 U/L (ref 6–29)
AST: 11 U/L (ref 10–35)
Albumin: 4 g/dL (ref 3.6–5.1)
Alkaline phosphatase (APISO): 66 U/L (ref 37–153)
BUN: 15 mg/dL (ref 7–25)
CO2: 23 mmol/L (ref 20–32)
Calcium: 9.4 mg/dL (ref 8.6–10.4)
Chloride: 108 mmol/L (ref 98–110)
Creat: 0.85 mg/dL (ref 0.50–1.03)
Globulin: 3 g/dL (calc) (ref 1.9–3.7)
Glucose, Bld: 135 mg/dL — ABNORMAL HIGH (ref 65–99)
Potassium: 4.6 mmol/L (ref 3.5–5.3)
Sodium: 143 mmol/L (ref 135–146)
Total Bilirubin: 0.3 mg/dL (ref 0.2–1.2)
Total Protein: 7 g/dL (ref 6.1–8.1)
eGFR: 79 mL/min/{1.73_m2} (ref >=60)

## 2021-12-16 LAB — PROTEIN / CREATININE RATIO, URINE
Creatinine, Urine: 98 mg/dL (ref 20–275)
Protein/Creat Ratio: 112 mg/g creat (ref 24–184)
Protein/Creatinine Ratio: 0.112 mg/mg creat (ref 0.024–0.184)
Total Protein, Urine: 11 mg/dL (ref 5–24)

## 2021-12-16 LAB — C3 AND C4
C3 Complement: 149 mg/dL (ref 83–193)
C4 Complement: 48 mg/dL (ref 15–57)

## 2021-12-16 LAB — ANTI-DNA ANTIBODY, DOUBLE-STRANDED: ds DNA Ab: 1 IU/mL

## 2021-12-16 LAB — SEDIMENTATION RATE: Sed Rate: 9 mm/h (ref 0–30)

## 2021-12-16 NOTE — Telephone Encounter (Signed)
Attempted to contact the patient, mobile number is not in service and left message on home number for patient to call the office. Also sent my chart message to remind patient about scheduling her PLQ eye exam.

## 2021-12-17 ENCOUNTER — Other Ambulatory Visit: Payer: Self-pay | Admitting: Rheumatology

## 2021-12-17 MED ORDER — HYDROXYCHLOROQUINE SULFATE 200 MG PO TABS: 200.0000 mg | ORAL_TABLET | Freq: Every day | ORAL | 1 refills | Status: AC

## 2021-12-17 NOTE — Progress Notes (Signed)
Rheumatoid factor negative, sed rate normal, complements normal, double-stranded DNA negative ANA negative, urine protein negative.  Glucose was elevated probably not a fasting sample.  CBC is normal except mildly elevated hemoglobin.

## 2021-12-17 NOTE — Telephone Encounter (Signed)
Patient left a voicemail stating she was able to get an appointment for her PLQ eye exam but not until Oct 18th. Patient states that was their first available. Patient requests that Dr. Corliss Skains call their office to get her seen sooner so she can resume her medication.

## 2021-12-17 NOTE — Telephone Encounter (Signed)
Attempted to contact the patient and left message for patient to call the office.  

## 2021-12-17 NOTE — Telephone Encounter (Signed)
I called [facility] to confirm and patient is scheduled for 02/09/2022 as a work in. I asked if patient could be seen sooner due to needing to go back on plaquenil and I was advised that the patient was worked in because right now they are scheduling in January 2024.  Please advise on plaquenil prescription.

## 2021-12-17 NOTE — Telephone Encounter (Signed)
Please send in a prescription for Plaquenil 200 mg p.o. daily with 1 refill.  We will give further refills only after her eye examination.

## 2021-12-20 NOTE — Telephone Encounter (Signed)
Patient advised prescription for PLQ has been sent to the pharmacy and we will be unable to refill again until after her PLQ eye exam results have been received.

## 2021-12-20 NOTE — Telephone Encounter (Signed)
Attempted to contact patient and left message to advise patient to call the office.  

## 2021-12-21 ENCOUNTER — Other Ambulatory Visit (HOSPITAL_COMMUNITY): Payer: Self-pay

## 2021-12-26 ENCOUNTER — Other Ambulatory Visit: Payer: Self-pay | Admitting: Family Medicine

## 2021-12-28 ENCOUNTER — Encounter: Payer: Self-pay | Admitting: Student

## 2022-01-07 ENCOUNTER — Other Ambulatory Visit (HOSPITAL_COMMUNITY): Payer: Self-pay

## 2022-01-10 ENCOUNTER — Other Ambulatory Visit (HOSPITAL_COMMUNITY): Payer: Self-pay

## 2022-01-13 ENCOUNTER — Other Ambulatory Visit: Payer: Self-pay | Admitting: Physician Assistant

## 2022-01-17 ENCOUNTER — Telehealth: Payer: Self-pay | Admitting: Pharmacist

## 2022-01-17 ENCOUNTER — Other Ambulatory Visit: Payer: Self-pay | Admitting: Student

## 2022-01-17 ENCOUNTER — Other Ambulatory Visit (HOSPITAL_COMMUNITY): Payer: Self-pay

## 2022-01-17 ENCOUNTER — Telehealth: Payer: Self-pay | Admitting: Student

## 2022-01-17 MED ORDER — OZEMPIC (2 MG/DOSE) 8 MG/3ML ~~LOC~~ SOPN
2.0000 mg | PEN_INJECTOR | SUBCUTANEOUS | 3 refills | Status: DC
  Filled 2022-01-17: qty 9, 84d supply, fill #0
  Filled 2022-01-17: qty 3, 28d supply, fill #0
  Filled 2022-01-17: qty 9, 84d supply, fill #0

## 2022-01-17 MED ORDER — OZEMPIC (2 MG/DOSE) 8 MG/3ML ~~LOC~~ SOPN: 2.0000 mg | PEN_INJECTOR | SUBCUTANEOUS | 3 refills | Status: DC

## 2022-01-17 NOTE — Telephone Encounter (Signed)
Patient walked in stating the prescription called in is wrong, supposed to be a 90 day supply. Have been out of medication for over a month. Prescription was written for a 30 day supply. If she gets the 30 day she will not be able to get the 90 day supply. Can the script be written today for 90 days.

## 2022-01-18 ENCOUNTER — Other Ambulatory Visit: Payer: Self-pay | Admitting: Student

## 2022-01-18 DIAGNOSIS — E1165 Type 2 diabetes mellitus with hyperglycemia: Secondary | ICD-10-CM

## 2022-01-18 MED ORDER — OZEMPIC (2 MG/DOSE) 8 MG/3ML ~~LOC~~ SOPN: 2.0000 mg | PEN_INJECTOR | SUBCUTANEOUS | 0 refills | Status: DC

## 2022-01-18 NOTE — Telephone Encounter (Signed)
Noted and agree. 

## 2022-01-18 NOTE — Telephone Encounter (Signed)
New prescription provided for insurance purposes to provided adequate quantity.

## 2022-01-20 ENCOUNTER — Other Ambulatory Visit (HOSPITAL_COMMUNITY): Payer: Self-pay

## 2022-01-29 ENCOUNTER — Other Ambulatory Visit: Payer: Self-pay | Admitting: Family Medicine

## 2022-02-07 ENCOUNTER — Other Ambulatory Visit (HOSPITAL_BASED_OUTPATIENT_CLINIC_OR_DEPARTMENT_OTHER): Payer: Self-pay
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
## 2022-03-10 ENCOUNTER — Other Ambulatory Visit: Payer: Self-pay | Admitting: Family Medicine

## 2022-03-10 DIAGNOSIS — E1165 Type 2 diabetes mellitus with hyperglycemia: Secondary | ICD-10-CM

## 2022-03-15 ENCOUNTER — Other Ambulatory Visit: Payer: Self-pay | Admitting: Pharmacist

## 2022-03-15 ENCOUNTER — Other Ambulatory Visit (HOSPITAL_COMMUNITY): Payer: Self-pay

## 2022-03-15 ENCOUNTER — Other Ambulatory Visit: Payer: Self-pay

## 2022-03-15 DIAGNOSIS — E1165 Type 2 diabetes mellitus with hyperglycemia: Secondary | ICD-10-CM

## 2022-03-15 MED ORDER — OZEMPIC (2 MG/DOSE) 8 MG/3ML ~~LOC~~ SOPN
2.0000 mg | PEN_INJECTOR | SUBCUTANEOUS | 3 refills | Status: DC
  Filled 2022-03-15: qty 9, 84d supply, fill #0
  Filled 2022-04-19: qty 3, 28d supply, fill #0
  Filled 2022-05-06: qty 6, 56d supply, fill #0
  Filled 2022-05-06 – 2022-08-26 (×9): qty 9, 84d supply, fill #0
  Filled 2022-08-26: qty 3, 28d supply, fill #0
  Filled 2022-08-26: qty 9, 84d supply, fill #0
  Filled 2022-09-16: qty 9, 84d supply, fill #1
  Filled 2022-09-20: qty 9, 84d supply, fill #0

## 2022-03-16 MED ORDER — ONETOUCH VERIO VI STRP: ORAL_STRIP | 2 refills | Status: DC

## 2022-03-22 ENCOUNTER — Ambulatory Visit: Payer: Medicare Other | Admitting: Physician Assistant

## 2022-03-22 ENCOUNTER — Other Ambulatory Visit: Payer: Self-pay | Admitting: Student

## 2022-03-22 DIAGNOSIS — Z13820 Encounter for screening for osteoporosis: Secondary | ICD-10-CM

## 2022-03-22 DIAGNOSIS — G8929 Other chronic pain: Secondary | ICD-10-CM

## 2022-03-22 DIAGNOSIS — E1165 Type 2 diabetes mellitus with hyperglycemia: Secondary | ICD-10-CM

## 2022-03-22 DIAGNOSIS — Z79899 Other long term (current) drug therapy: Secondary | ICD-10-CM

## 2022-03-22 DIAGNOSIS — M19071 Primary osteoarthritis, right ankle and foot: Secondary | ICD-10-CM

## 2022-03-22 DIAGNOSIS — M19041 Primary osteoarthritis, right hand: Secondary | ICD-10-CM

## 2022-03-22 DIAGNOSIS — F17200 Nicotine dependence, unspecified, uncomplicated: Secondary | ICD-10-CM

## 2022-03-22 DIAGNOSIS — M5136 Other intervertebral disc degeneration, lumbar region: Secondary | ICD-10-CM

## 2022-03-22 DIAGNOSIS — I10 Essential (primary) hypertension: Secondary | ICD-10-CM

## 2022-03-22 DIAGNOSIS — R5383 Other fatigue: Secondary | ICD-10-CM

## 2022-03-22 DIAGNOSIS — M3219 Other organ or system involvement in systemic lupus erythematosus: Secondary | ICD-10-CM

## 2022-03-23 ENCOUNTER — Other Ambulatory Visit (HOSPITAL_COMMUNITY): Payer: Self-pay

## 2022-03-23 MED ORDER — OZEMPIC (2 MG/DOSE) 8 MG/3ML ~~LOC~~ SOPN
2.0000 mg | PEN_INJECTOR | SUBCUTANEOUS | 0 refills | Status: DC
  Filled ????-??-??: fill #0

## 2022-04-12 ENCOUNTER — Telehealth: Payer: Self-pay | Admitting: Student

## 2022-04-12 NOTE — Telephone Encounter (Signed)
Patient called stating her insurance will not cover Vero one test strips, she is needing it sent in for Accu-Chek for 10 of the 100 count boxes.   Pharmacy is [facility] on [address] she is needing it ASAP due to going out of town.   Please Advise.   Thanks!

## 2022-04-13 ENCOUNTER — Other Ambulatory Visit (HOSPITAL_COMMUNITY): Payer: Self-pay

## 2022-04-13 ENCOUNTER — Other Ambulatory Visit: Payer: Self-pay | Admitting: Student

## 2022-04-13 DIAGNOSIS — E1165 Type 2 diabetes mellitus with hyperglycemia: Secondary | ICD-10-CM

## 2022-04-13 MED ORDER — ACCU-CHEK AVIVA PLUS VI STRP: ORAL_STRIP | 12 refills | Status: DC

## 2022-04-13 MED ORDER — SEMAGLUTIDE (1 MG/DOSE) 4 MG/3ML ~~LOC~~ SOPN
1.0000 mg | PEN_INJECTOR | SUBCUTANEOUS | 0 refills | Status: DC
  Filled 2022-04-13: qty 3, 28d supply, fill #0
  Filled 2022-04-19 – 2022-05-06 (×3): qty 3, 28d supply, fill #1
  Filled 2022-05-06: qty 6, 56d supply, fill #0
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
## 2022-04-13 NOTE — Progress Notes (Signed)
Called patient. Pt states she checks her blood sugar multiple times a day. Only takes Ozempic when it is available. Takes metformin sporadically because it upsets her stomach and only takes it when her blood sugar is high. Currently does not have Ozempic.   I advised pt she needs an appointment ASAP to check an A1c and further discuss. She will be out of town and can't come until end of Feb 2024. Apt has been made.   I advised pt to stop checking sugar as she is not on insulin. Prescription for ozempic 1 mg sent to pharmacy. Pt states she has tolerated Ozempic well in the pas and thinks she will do fine with 1 mg dosage.

## 2022-04-13 NOTE — Telephone Encounter (Signed)
Patient calls nurse line in regards to recent prescription for test strips.   She reports she is needing 1000 test strips. She reports she is testing her blood sugar 8-10x per day.   She reports she has gotten 1000 test strips in the past.   Will forward to PCP.

## 2022-04-19 ENCOUNTER — Other Ambulatory Visit (HOSPITAL_COMMUNITY): Payer: Self-pay

## 2022-04-26 ENCOUNTER — Ambulatory Visit: Payer: Medicare Other | Attending: Physician Assistant | Admitting: Physician Assistant

## 2022-04-26 DIAGNOSIS — M5136 Other intervertebral disc degeneration, lumbar region: Secondary | ICD-10-CM

## 2022-04-26 DIAGNOSIS — M19042 Primary osteoarthritis, left hand: Secondary | ICD-10-CM

## 2022-04-26 DIAGNOSIS — Z13820 Encounter for screening for osteoporosis: Secondary | ICD-10-CM

## 2022-04-26 DIAGNOSIS — F17200 Nicotine dependence, unspecified, uncomplicated: Secondary | ICD-10-CM

## 2022-04-26 DIAGNOSIS — M19071 Primary osteoarthritis, right ankle and foot: Secondary | ICD-10-CM

## 2022-04-26 DIAGNOSIS — R5383 Other fatigue: Secondary | ICD-10-CM

## 2022-04-26 DIAGNOSIS — I10 Essential (primary) hypertension: Secondary | ICD-10-CM

## 2022-04-26 DIAGNOSIS — Z79899 Other long term (current) drug therapy: Secondary | ICD-10-CM

## 2022-04-26 DIAGNOSIS — M3219 Other organ or system involvement in systemic lupus erythematosus: Secondary | ICD-10-CM

## 2022-04-26 DIAGNOSIS — E1165 Type 2 diabetes mellitus with hyperglycemia: Secondary | ICD-10-CM

## 2022-04-26 DIAGNOSIS — G8929 Other chronic pain: Secondary | ICD-10-CM

## 2022-05-06 ENCOUNTER — Other Ambulatory Visit (HOSPITAL_COMMUNITY): Payer: Self-pay

## 2022-05-30 ENCOUNTER — Other Ambulatory Visit: Payer: Self-pay | Admitting: Family Medicine

## 2022-06-07 ENCOUNTER — Other Ambulatory Visit (HOSPITAL_COMMUNITY): Payer: Self-pay

## 2022-06-07 ENCOUNTER — Other Ambulatory Visit: Payer: Self-pay

## 2022-06-07 ENCOUNTER — Other Ambulatory Visit: Payer: Self-pay | Admitting: Student

## 2022-06-07 DIAGNOSIS — E1165 Type 2 diabetes mellitus with hyperglycemia: Secondary | ICD-10-CM

## 2022-06-07 MED ORDER — OZEMPIC (1 MG/DOSE) 4 MG/3ML ~~LOC~~ SOPN
1.0000 mg | PEN_INJECTOR | SUBCUTANEOUS | 0 refills | Status: DC
  Filled 2022-06-07 – 2022-06-17 (×5): qty 9, 84d supply, fill #0
  Filled ????-??-??: fill #0

## 2022-06-09 ENCOUNTER — Other Ambulatory Visit (HOSPITAL_COMMUNITY): Payer: Self-pay

## 2022-06-10 ENCOUNTER — Other Ambulatory Visit (HOSPITAL_COMMUNITY): Payer: Self-pay

## 2022-06-13 ENCOUNTER — Other Ambulatory Visit: Payer: Self-pay | Admitting: Family Medicine

## 2022-06-13 DIAGNOSIS — E1165 Type 2 diabetes mellitus with hyperglycemia: Secondary | ICD-10-CM

## 2022-06-15 ENCOUNTER — Other Ambulatory Visit (HOSPITAL_COMMUNITY): Payer: Self-pay

## 2022-06-17 ENCOUNTER — Other Ambulatory Visit (HOSPITAL_COMMUNITY): Payer: Self-pay

## 2022-06-17 ENCOUNTER — Other Ambulatory Visit: Payer: Self-pay

## 2022-06-21 ENCOUNTER — Ambulatory Visit (INDEPENDENT_AMBULATORY_CARE_PROVIDER_SITE_OTHER): Payer: Medicare Other | Admitting: Student

## 2022-06-21 ENCOUNTER — Encounter: Payer: Self-pay | Admitting: Student

## 2022-06-21 VITALS — BP 138/76 | HR 83 | Ht 62.5 in | Wt 208.2 lb

## 2022-06-21 DIAGNOSIS — Z1211 Encounter for screening for malignant neoplasm of colon: Secondary | ICD-10-CM

## 2022-06-21 DIAGNOSIS — Z1231 Encounter for screening mammogram for malignant neoplasm of breast: Secondary | ICD-10-CM | POA: Diagnosis not present

## 2022-06-21 DIAGNOSIS — R7309 Other abnormal glucose: Secondary | ICD-10-CM

## 2022-06-21 LAB — POCT GLYCOSYLATED HEMOGLOBIN (HGB A1C): HbA1c, POC (controlled diabetic range): 11.1 % — AB (ref 0.0–7.0)

## 2022-06-21 NOTE — Patient Instructions (Addendum)
It was great to see you! Thank you for allowing me to participate in your care!  Our plans for today:  - Increase your Ozempic to 2 mg  - In two weeks, call or send me a mychart message to let me know what your sugars are running. If they are lowered, we can start an SGLT2 medication (jardiance). If they are still high, we can start a medication called glipizide for a short period of time.  - return for an in person visit in 3 months.  -Call to schedule your mammogram  -Cologaurd kit will me mailed to your home for colon cancer screening   Take care and seek immediate care sooner if you develop any concerns.   Dr. Precious Gilding, DO [facility]

## 2022-06-21 NOTE — Progress Notes (Signed)
    SUBJECTIVE:   CHIEF COMPLAINT / HPI:   T2DM Last A1c of 7.6 in 2021. Stopped metformin several months ago d/t GI side effects. She has been taking Ozempic 1 mg for past  2 months and tolerating it well. A1c today of 11.1. She states she feels great, just a little thirsty last night. Checks blood sugars at home, have been running 300's-500's.  Admits to eating more sugar recently but states she knows how to follow a diabetic diet and has in the past.  She does not want to take insulin but would like to increase Ozempic and work on dietary changes. Pt states she has an ophthalmologist and plans to go soon for an eye exam.   Health mainentance Due for COVID, Tdap, flu,zoster, colonoscopy, mammogram, DM eye exam, AWV, foot exam.  Declines all vaccines. Declines colonoscopy and agrees with cologaurd.   PERTINENT  PMH / PSH: T2DM   OBJECTIVE:   Vitals:   06/21/22 1351  BP: 138/76  Pulse: 83  SpO2: 98%    General: NAD, pleasant, able to participate in exam Cardiac: Well-perfused Respiratory: Breathing comfortably on room air Extremities: no edema or cyanosis of BLEs.  Sensation intact with monofilament testing in bilateral feet, posterior tibial and dorsalis pedis pulses present bilaterally Skin: warm and dry Neuro: alert, no obvious focal deficits Psych: Normal affect and mood  ASSESSMENT/PLAN:   Type 2 diabetes mellitus, uncontrolled ([place]) Diabetes uncontrolled with A1c of 11.5.  Patient prefers not to start insulin at this time but instead would like to increase Ozempic and follow a diabetic diet.  Handout on diabetic diet provided and discussed.  Diabetic foot exam completed today.  Patient plans to see ophthalmologist for diabetic eye exam and states she does not need a referral.  Plan as noted in AVS: - Increase your Ozempic to 2 mg  - In two weeks, call or send me a mychart message to let me know what your sugars are running. If they are lowered, we can start an SGLT2  medication (jardiance). If they are still high, we can start a medication called glipizide for a short period of time.  - return for an in person visit in 3 months.    Health maintenance -Mammogram ordered -Cologuard ordered -Will discuss medicare AWV at next visit    Dr. Precious Gilding, [place]

## 2022-06-21 NOTE — Assessment & Plan Note (Addendum)
Diabetes uncontrolled with A1c of 11.5.  Patient prefers not to start insulin at this time but instead would like to increase Ozempic and follow a diabetic diet.  Handout on diabetic diet provided and discussed.  Diabetic foot exam completed today.  Patient plans to see ophthalmologist for diabetic eye exam and states she does not need a referral.  Plan as noted in AVS: - Increase your Ozempic to 2 mg  - In two weeks, call or send me a mychart message to let me know what your sugars are running. If they are lowered, we can start an SGLT2 medication (jardiance). If they are still high, we can start a medication called glipizide for a short period of time.  - return for an in person visit in 3 months.

## 2022-06-29 ENCOUNTER — Other Ambulatory Visit (HOSPITAL_COMMUNITY): Payer: Self-pay

## 2022-06-30 ENCOUNTER — Telehealth: Payer: Self-pay

## 2022-06-30 NOTE — Telephone Encounter (Signed)
LIBERATE Study  Received referral for patient participation in the LIBERATE CGM Study. Contacted patient to discuss study and confirmed HIPAA identifiers. Confirmed patient was provided the LIBERATE Study Information Sheet and any questions were answered.   Confirmed that patient meets study criteria by having a diagnosis of Type 2 Diabetes, is not currently on insulin, and most recent A1c is >8%.  Patient provided verbal consent to participate in the study. However, she does not have enough storage on her phone to download the [employer] app, therefore, she will not be able to participate.    Joseph Art, Pharm.D. PGY-2 Ambulatory Care Pharmacy Resident

## 2022-07-08 NOTE — Telephone Encounter (Signed)
Patient returns call to nurse line requesting to speak with someone with Liberate study further, as she has additional questions.   Will forward to Joseph Art and pharmacy team.   metacarpal

## 2022-07-11 ENCOUNTER — Other Ambulatory Visit (HOSPITAL_COMMUNITY): Payer: Self-pay

## 2022-07-21 ENCOUNTER — Encounter: Payer: Medicare Other | Admitting: Pharmacist

## 2022-07-21 NOTE — Telephone Encounter (Signed)
Attempted to contact patient regarding canceled appointment today for LIBERATE study. However, patient answered the phone and then promptly hung up. Unable to reschedule visit.   Joseph Art, Pharm.D. PGY-2 Ambulatory Care Pharmacy Resident

## 2022-07-26 ENCOUNTER — Telehealth: Payer: Self-pay | Admitting: Student

## 2022-07-26 ENCOUNTER — Other Ambulatory Visit (HOSPITAL_COMMUNITY): Payer: Self-pay

## 2022-07-26 ENCOUNTER — Encounter: Payer: Medicare Other | Admitting: Pharmacist

## 2022-07-26 NOTE — Telephone Encounter (Signed)
Called patient to schedule Medicare Annual Wellness Visit (AWV). Left message for patient to call back and schedule Medicare Annual Wellness Visit (AWV).  Last date of AWV: 09/12/2018   Please schedule an AWVS appointment at any time with [phone].  If any questions, please contact me at [phone].    Thank you,  [place] Direct dial  [phone]

## 2022-08-09 ENCOUNTER — Other Ambulatory Visit: Payer: Self-pay

## 2022-08-09 ENCOUNTER — Other Ambulatory Visit: Payer: Self-pay | Admitting: Student

## 2022-08-09 DIAGNOSIS — E1165 Type 2 diabetes mellitus with hyperglycemia: Secondary | ICD-10-CM

## 2022-08-24 ENCOUNTER — Telehealth: Payer: Self-pay | Admitting: Student

## 2022-08-24 NOTE — Telephone Encounter (Signed)
Contacted [name] to schedule their annual wellness visit. Appointment made for 08/29/2022.  Thank you,  [facility] Support [facility] Direct dial  [phone]

## 2022-08-26 ENCOUNTER — Other Ambulatory Visit (HOSPITAL_COMMUNITY): Payer: Self-pay

## 2022-08-27 ENCOUNTER — Other Ambulatory Visit: Payer: Self-pay | Admitting: Student

## 2022-08-28 NOTE — Patient Instructions (Signed)

## 2022-08-28 NOTE — Progress Notes (Unsigned)
I connected with  [name] on 08/29/2022 by a audio enabled telemedicine application and verified that I am speaking with the correct person using two identifiers.  Patient Location: Home  Provider Location: Home Office  I discussed the limitations of evaluation and management by telemedicine. The patient expressed understanding and agreed to proceed.   Subjective:   [name] is a 57 y.o. female who presents for Medicare Annual (Subsequent) preventive examination.  Review of Systems    Per HPI unless specifically indicated below.  Cardiac Risk Factors include: advanced age (>52men, >67 women);female gender, Essential Hypertension.          Objective:       06/21/2022    1:51 PM 12/15/2021    1:55 PM 02/22/2021    3:18 PM  Vitals with BMI  Height 5' 2.5" 5' 2.5" 5' 2.5"  Weight 208 lbs 3 oz 205 lbs 10 oz   BMI 37.45 36.98   Systolic 138 172 696  Diastolic 76 80 73  Pulse 83 77 81    Today's Vitals   08/29/22 1438  PainSc: 3    There is no height or weight on file to calculate BMI.     06/21/2022    1:53 PM 12/24/2020    4:03 PM 08/14/2020    1:40 PM 03/30/2020    1:54 PM 06/19/2019    1:34 PM 02/18/2019    2:26 PM 01/16/2019    1:34 PM  Advanced Directives  Does Patient Have a Medical Advance Directive? No No No No No No No  Would patient like information on creating a medical advance directive? No - Patient declined No - Patient declined No - Patient declined No - Patient declined No - Patient declined No - Patient declined No - Patient declined    Current Medications (verified) Outpatient Encounter Medications as of 08/29/2022  Medication Sig   albuterol (VENTOLIN HFA) 108 (90 Base) MCG/ACT inhaler TAKE 2 PUFFS BY MOUTH EVERY 6 HOURS AS NEEDED FOR WHEEZE OR SHORTNESS OF BREATH   atorvastatin (LIPITOR) 20 MG tablet TAKE 1 TABLET BY MOUTH ONCE DAILY   glucose blood (ACCU-CHEK AVIVA PLUS) test strip Use as instructed   hydroxychloroquine (PLAQUENIL) 200  MG tablet Take 1 tablet (200 mg total) by mouth daily.   Lancet Devices (ONE TOUCH DELICA LANCING DEV) MISC 1 each by Does not apply route daily. Use three lancets per day to test blood glucose levels with glucose meter   Lancets (ONETOUCH DELICA PLUS LANCET33G) MISC CHECK BLOOD SUGAR 3 TIMES DAILY   Melatonin 5 MG TABS Take 10 mg by mouth at bedtime.   metFORMIN (GLUCOPHAGE-XR) 500 MG 24 hr tablet TAKE 1 TABLET BY MOUTH DAILY  WITH BREAKFAST   Semaglutide, 2 MG/DOSE, (OZEMPIC, 2 MG/DOSE,) 8 MG/3ML SOPN Inject 2 mg into the skin once a week.   [DISCONTINUED] albuterol (VENTOLIN HFA) 108 (90 Base) MCG/ACT inhaler TAKE 2 PUFFS BY MOUTH EVERY 6 HOURS AS NEEDED FOR WHEEZE OR SHORTNESS OF BREATH   [DISCONTINUED] Semaglutide, 1 MG/DOSE, (OZEMPIC, 1 MG/DOSE,) 4 MG/3ML SOPN Inject 1 mg into the skin once a week. (Patient not taking: Reported on 08/29/2022)   No facility-administered encounter medications on file as of 08/29/2022.    Allergies (verified) Penicillins and Norco [hydrocodone-acetaminophen]   History: Past Medical History:  Diagnosis Date   Arthritis    Chronic back pain    DDD   Degenerative disk disease 02/27/2013   Diabetes mellitus without complication (HCC)  takes Metformin daily   Hypertension    Insomnia    takes Melatonin nightly   Joint pain    Lupus (HCC)    takes Plaquenil daily   Narrowing of intervertebral disc space 02/27/2013   Piriformis syndrome 06/02/2010   07/2011 MRI L-spine: 1. Prominent left lateral recess protrusion at L5-S1 encroaches on  the descending left S1 root.  2. Mild disc bulge and moderate facet degenerative disease L4-5  without central canal or foraminal narrowing.     Substance abuse (HCC)    Tachycardia 02/05/2016   Trichomoniasis 02/20/2018   Trigger thumb of left hand 02/18/2014   Past Surgical History:  Procedure Laterality Date   APPENDECTOMY     SPINE SURGERY  03/10/2016   Spinal Fusion L5-S1   TONSILLECTOMY     Family History   Problem Relation Age of Onset   Hypertension Mother    Kidney disease Mother    COPD Mother    Heart disease Father    Bone cancer Father    Allergy (severe) Sister    Diabetes Paternal Grandmother    Social History   Socioeconomic History   Marital status: Single    Spouse name: Not on file   Number of children: Not on file   Years of education: Not on file   Highest education level: Not on file  Occupational History   Occupation: food bank   Occupation: unemployed  Tobacco Use   Smoking status: Every Day    Packs/day: 0.50    Years: 15.00    Additional pack years: 0.00    Total pack years: 7.50    Types: Cigarettes    Passive exposure: Never   Smokeless tobacco: Never   Tobacco comments:    Smoking 3-5 cigs per day  Vaping Use   Vaping Use: Never used  Substance and Sexual Activity   Alcohol use: Yes    Comment: occ   Drug use: No   Sexual activity: Yes    Birth control/protection: Post-menopausal  Other Topics Concern   Not on file  Social History Narrative   Current Social History 12/29/2016           Patient lives with 2 roommates in one level home 12/29/2016   Transportation: Patient uses [employer] 12/29/2016   Important Relationships "Family" 12/29/2016    Pets: None 12/29/2016   Education / Work:  2 years college/ Disabled 12/29/2016   Interests / Fun: Read, fish, social media 12/29/2016   Current Stressors: "Money" 12/29/2016   L. Leward Quan, RN, BSN                                                                                                 Social Determinants of Health   Financial Resource Strain: High Risk (08/29/2022)   Overall Financial Resource Strain (CARDIA)    Difficulty of Paying Living Expenses: Very hard  Food Insecurity: No Food Insecurity (08/29/2022)   Hunger Vital Sign    Worried About Running Out of Food in the Last Year: Never true    Ran Out of Food in the Last Year:  Never true  Transportation Needs: No Transportation Needs (08/29/2022)   PRAPARE -  Administrator, Civil Service (Medical): No    Lack of Transportation (Non-Medical): No  Physical Activity: Insufficiently Active (08/29/2022)   Exercise Vital Sign    Days of Exercise per Week: 1 day    Minutes of Exercise per Session: 30 min  Stress: Stress Concern Present (08/29/2022)   [employer] of Occupational Health - Occupational Stress Questionnaire    Feeling of Stress : Very much  Social Connections: Moderately Integrated (08/29/2022)   Social Connection and Isolation Panel [NHANES]    Frequency of Communication with Friends and Family: Three times a week    Frequency of Social Gatherings with Friends and Family: Three times a week    Attends Religious Services: More than 4 times per year    Active Member of Clubs or Organizations: Yes    Attends Engineer, structural: More than 4 times per year    Marital Status: Never married    Tobacco Counseling Ready to quit: Not Answered Counseling given: Not Answered Tobacco comments: Smoking 3-5 cigs per day   Clinical Intake:  Pre-visit preparation completed: No  Pain : 0-10 Pain Score: 3  Pain Type: Chronic pain Pain Descriptors / Indicators: Constant Pain Frequency: Constant     Nutritional Status: BMI > 30  Obese Nutritional Risks: Unintentional weight gain, Nausea/ vomitting/ diarrhea Diabetes: Yes CBG done?: Yes CBG resulted in Enter/ Edit results?: No Did pt. bring in CBG monitor from home?: No  How often do you need to have someone help you when you read instructions, pamphlets, or other written materials from your doctor or pharmacy?: 1 - Never  Diabetic?Nutrition Risk Assessment:  Has the patient had any N/V/D within the last 2 months?  Yes  Does the patient have any non-healing wounds?  No  Has the patient had any unintentional weight loss or weight gain?  No   Diabetes:  Is the patient diabetic?  Yes  If diabetic, was a CBG obtained today?  No  Did the patient bring in their  glucometer from home?  No  How often do you monitor your CBG's? occasonally.   Financial Strains and Diabetes Management:  Are you having any financial strains with the device, your supplies or your medication? No .  Does the patient want to be seen by Chronic Care Management for management of their diabetes?  No  Would the patient like to be referred to a Nutritionist or for Diabetic Management?  No   Diabetic Exams:  Diabetic Eye Exam: Overdue for diabetic eye exam. Pt has been advised about the importance in completing this exam. Patient advised to call and schedule an eye exam. Diabetic Foot Exam: Completed 06/21/2022    Interpreter Needed?: No  Information entered by :: Laurel Dimmer, cMA   Activities of Daily Living    08/29/2022    2:37 PM  In your present state of health, do you have any difficulty performing the following activities:  Hearing? 0  Vision? 1  Difficulty concentrating or making decisions? 1  Walking or climbing stairs? 1  Dressing or bathing? 0  Doing errands, shopping? 0    Patient Care Team: Erick Alley, DO as PCP - General (Family Medicine) Sallye Lat, MD as Consulting Physician (Ophthalmology)  Indicate any recent Medical Services you may have received from other than [facility] providers in the past year (date may be approximate).     Assessment:  This is a routine wellness examination for Sianna.   Hearing/Vision screen Denies any hearing issues. Denies any change to her vision. Wear glasses. Annual Eye Exam.   Dietary issues and exercise activities discussed: Current Exercise Habits: Structured exercise class, Type of exercise: walking, Time (Minutes): 30, Frequency (Times/Week): 1, Weekly Exercise (Minutes/Week): 30, Intensity: Mild, Exercise limited by: None identified   Goals Addressed   None    Depression Screen    08/29/2022    2:36 PM 06/21/2022    1:53 PM 12/24/2020    4:03 PM 08/14/2020    1:40 PM 03/30/2020    1:54 PM  06/19/2019    1:35 PM 05/20/2019    9:22 AM  PHQ 2/9 Scores  PHQ - 2 Score 2 0 0 0 0 0 0  PHQ- 9 Score  3 2 3 1       Fall Risk    08/29/2022    2:37 PM 12/24/2020    4:03 PM 03/30/2020    1:54 PM 06/19/2019    1:35 PM 05/20/2019    9:22 AM  Fall Risk   Falls in the past year? 0 0 0 0 0  Number falls in past yr: 0 0 0 0 0  Injury with Fall? 0 0 0 0 0  Risk for fall due to : No Fall Risks      Follow up Falls evaluation completed        FALL RISK PREVENTION PERTAINING TO THE HOME:  Any stairs in or around the home? No  If so, are there any without handrails? No  Home free of loose throw rugs in walkways, pet beds, electrical cords, etc? Yes  Adequate lighting in your home to reduce risk of falls? Yes   ASSISTIVE DEVICES UTILIZED TO PREVENT FALLS:  Life alert? No  Use of a cane, walker or w/c? No  Grab bars in the bathroom? No  Shower chair or bench in shower? No  Elevated toilet seat or a handicapped toilet? No   TIMED UP AND GO:  Was the test performed? Unable to perform, virtual appointment   Cognitive Function:    09/12/2018   11:01 AM  MMSE - Mini Mental State Exam  Not completed: Unable to complete        08/29/2022    2:41 PM 09/12/2018   10:59 AM  6CIT Screen  What Year? 0 points 0 points  What month? 0 points 0 points  What time? 0 points 0 points  Count back from 20 0 points 0 points  Months in reverse 0 points 0 points  Repeat phrase 0 points 2 points  Total Score 0 points 2 points    Immunizations Immunization History  Administered Date(s) Administered   Influenza Whole 03/12/2009, 03/21/2013   Influenza, Seasonal, Injecte, Preservative Fre 02/18/2013   PPD Test 01/25/2019    TDAP status: Up to date  Flu Vaccine status: Up to date  Pneumococcal vaccine status: Up to date  Covid-19 vaccine status: Information provided on how to obtain vaccines.   Qualifies for Shingles Vaccine? Yes   Zostavax completed No   Shingrix Completed?: No.     Education has been provided regarding the importance of this vaccine. Patient has been advised to call insurance company to determine out of pocket expense if they have not yet received this vaccine. Advised may also receive vaccine at local pharmacy or Health Dept. Verbalized acceptance and understanding.  Screening Tests Health Maintenance  Topic Date Due   COVID-19  Vaccine (1) Never done   DTaP/Tdap/Td (1 - Tdap) Never done   Zoster Vaccines- Shingrix (1 of 2) Never done   COLONOSCOPY (Pts 45-40yrs Insurance coverage will need to be confirmed)  Never done   MAMMOGRAM  Never done   OPHTHALMOLOGY EXAM  07/19/2017   INFLUENZA VACCINE  11/24/2022   Diabetic kidney evaluation - eGFR measurement  12/16/2022   Diabetic kidney evaluation - Urine ACR  12/16/2022   HEMOGLOBIN A1C  12/20/2022   FOOT EXAM  06/22/2023   Medicare Annual Wellness (AWV)  08/29/2023   PAP SMEAR-Modifier  03/30/2025   Hepatitis C Screening  Completed   HIV Screening  Completed   HPV VACCINES  Aged Out    Health Maintenance  Health Maintenance Due  Topic Date Due   COVID-19 Vaccine (1) Never done   DTaP/Tdap/Td (1 - Tdap) Never done   Zoster Vaccines- Shingrix (1 of 2) Never done   COLONOSCOPY (Pts 45-93yrs Insurance coverage will need to be confirmed)  Never done   MAMMOGRAM  Never done   OPHTHALMOLOGY EXAM  07/19/2017    Colon cancer screening: The patient currently got the cologuard kit and states she will complete it and send it in.   Mammogram status: Ordered 06/21/2022. Pt provided with contact info and advised to call to schedule appt.   DEXA Scan:  Lung Cancer Screening: (Low Dose CT Chest recommended if Age 36-80 years, 30 pack-year currently smoking OR have quit w/in 15years.) does not qualify.   Lung Cancer Screening Referral: not applicable   Additional Screening:  Hepatitis C Screening: does qualify; Completed 01/16/2019  Vision Screening: Recommended annual ophthalmology exams for early  detection of glaucoma and other disorders of the eye. Is the patient up to date with their annual eye exam?  No Who is the provider or what is the name of the office in which the patient attends annual eye exams?  If pt is not established with a provider, would they like to be referred to a provider to establish care? No . The pt will call and schedule an appt  Dental Screening: Recommended annual dental exams for proper oral hygiene  Community Resource Referral / Chronic Care Management: CRR required this visit?  Yes  CCM required this visit?  Yes  The patient was evicted from her house in March 2024,  and is currently staying in temporary housing with the risk of being homeless if she doesn't find somewhere to go soon.      Plan:     I have personally reviewed and noted the following in the patient's chart:   Medical and social history Use of alcohol, tobacco or illicit drugs  Current medications and supplements including opioid prescriptions. Patient is not currently taking opioid prescriptions. Functional ability and status Nutritional status Physical activity Advanced directives List of other physicians Hospitalizations, surgeries, and ER visits in previous 12 months Vitals Screenings to include cognitive, depression, and falls Referrals and appointments  In addition, I have reviewed and discussed with patient certain preventive protocols, quality metrics, and best practice recommendations. A written personalized care plan for preventive services as well as general preventive health recommendations were provided to patient.     Lonna Cobb, CMA   08/29/2022  Nurse Notes:Approximately 30 minute Non-Face -To-Face Medicare Wellness Visit

## 2022-08-29 ENCOUNTER — Ambulatory Visit (INDEPENDENT_AMBULATORY_CARE_PROVIDER_SITE_OTHER): Payer: Medicare Other

## 2022-08-29 DIAGNOSIS — Z59811 Housing instability, housed, with risk of homelessness: Secondary | ICD-10-CM

## 2022-08-29 DIAGNOSIS — Z0000 Encounter for general adult medical examination without abnormal findings: Secondary | ICD-10-CM

## 2022-08-30 ENCOUNTER — Telehealth: Payer: Self-pay | Admitting: *Deleted

## 2022-08-30 NOTE — Progress Notes (Signed)
  Care Coordination   Note   08/30/2022 Name: [name] MRN: [id number] DOB: Sep 01, 1965  [name] is a 57 y.o. year old female who sees Erick Alley, DO for primary care. I reached out to [name] by phone today to offer care coordination services.  [name] was given information about Care Coordination services today including:   The Care Coordination services include support from the care team which includes your Nurse Coordinator, Clinical Social Worker, or Pharmacist.  The Care Coordination team is here to help remove barriers to the health concerns and goals most important to you. Care Coordination services are voluntary, and the patient may decline or stop services at any time by request to their care team member.   Care Coordination Consent Status: Patient agreed to services and verbal consent obtained.   Follow up plan:  Telephone appointment with care coordination team member scheduled for:  08/31/22  Encounter Outcome:  Pt. Scheduled  [facility] Coordination Care Guide  Direct Dial: [phone]

## 2022-08-31 ENCOUNTER — Ambulatory Visit: Payer: Self-pay

## 2022-08-31 NOTE — Patient Instructions (Signed)
Visit Information  Thank you for taking time to visit with me today. Please don't hesitate to contact me if I can be of assistance to you.   Following are the goals we discussed today:  -Contact [facility] at [phone] for assistance with placement into a shelter -Visit the [facility] at [phone]  [phone]) for overnight shelter and other critical resources such as laundry, bathing, phone, and mail    If you are experiencing a Mental Health or Behavioral Health Crisis or need someone to talk to, please call 911  Patient verbalizes understanding of instructions and care plan provided today and agrees to view in MyChart. Active MyChart status and patient understanding of how to access instructions and care plan via MyChart confirmed with patient.     No further follow up required: Please contact your primary care provider as needed  Bevelyn Ngo, Kenard Gower, CDP Social Worker, Certified Dementia Practitioner [facility] Care Management  Care Coordination [phone]

## 2022-08-31 NOTE — Patient Outreach (Signed)
  Care Coordination   Initial Visit Note   08/31/2022 Name: AZARI DRUSCHEL MRN: [id number] DOB: [birth year]  [name] is a 57 y.o. year old female who sees Erick Alley, DO for primary care. I spoke with  [name] by phone today.  What matters to the patients health and wellness today?  Patient needs to find housing    Goals Addressed             This Visit's Progress    COMPLETED: Care Coordination Activities       Care Coordination Interventions: Referral received indicating patient is staying in temporary housing and is in need of long term housing options Contacted the patient who indicates in the last 30 minutes she has been asked to leave her boyfriends families home and she has nowhere to go. Patient reports she does not have a vehicle of her own Provided the patient with the contact number to [facility] advising she call now to see if there is a shelter she can stay in overnight; SW placed secure referral to [facility] as well via e-mail Inbound call received from the patient indicating she left a voice message for [facility], patient requests more resources Discussed barriers with locating housing for acute needs unless it is a shelter Determined the patient does not want to stay at [facility], patient reports she will not be able to find a ride to [employer] for [employer] the patient to await a return call from [facility] for acute housing needs Advised the patient to call 211 - patient declined stating she has called them previously and did not find them helpful Attempted to provide the patient with other resources including [employer] and [employer] - patient declined stating she has a Horticulturist, commercial of resources that include resources SW would provide Encouraged the patient to contact SW should she change her mind and want more resources         SDOH assessments and interventions completed:  Yes  SDOH  Interventions Today    Flowsheet Row Most Recent Value  SDOH Interventions   Housing Interventions Patient Refused, Other (Comment)  [Referred patient to [employer],  attempted to provide other resources. Pt declined stating she has a list of shelters and is not interested in longterm housing resources at this time]        Care Coordination Interventions:  Yes, provided   Interventions Today    Flowsheet Row Most Recent Value  Chronic Disease   Chronic disease during today's visit Other  General Interventions   General Interventions Discussed/Reviewed General Interventions Discussed, Tax adviser to [facility]]  Education Interventions   Education Provided Provided Education  Provided Verbal Education On [employer]  [[facility]]        Follow up plan: No further intervention required. Patient is encouraged to contact SW as needed.    Encounter Outcome:  Pt. Visit Completed   Bevelyn Ngo, BSW, CDP Social Worker, Certified Dementia Practitioner [facility] Care Management  Care Coordination [phone]

## 2022-09-16 ENCOUNTER — Other Ambulatory Visit (HOSPITAL_COMMUNITY): Payer: Self-pay

## 2022-09-20 ENCOUNTER — Other Ambulatory Visit (HOSPITAL_COMMUNITY): Payer: Self-pay

## 2022-09-20 ENCOUNTER — Other Ambulatory Visit: Payer: Self-pay | Admitting: Family Medicine

## 2022-09-21 ENCOUNTER — Other Ambulatory Visit (HOSPITAL_COMMUNITY): Payer: Self-pay

## 2022-09-21 NOTE — Telephone Encounter (Signed)
Requested medication (s) are due for refill today: yes  Requested medication (s) are on the active medication list: yes  Last refill:  10/25/21  Future visit scheduled: yes  Notes to clinic:  Unable to refill per protocol, no protocol, routing for review      Requested Prescriptions  Pending Prescriptions Disp Refills   atorvastatin (LIPITOR) 20 MG tablet [Pharmacy Med Name: Atorvastatin Calcium 20 MG Oral Tablet] 100 tablet 2    Sig: TAKE 1 TABLET BY MOUTH ONCE DAILY     There is no refill protocol information for this order

## 2022-12-05 ENCOUNTER — Other Ambulatory Visit: Payer: Self-pay | Admitting: Student

## 2022-12-05 DIAGNOSIS — E1165 Type 2 diabetes mellitus with hyperglycemia: Secondary | ICD-10-CM

## 2022-12-05 NOTE — Progress Notes (Unsigned)
  SUBJECTIVE:   CHIEF COMPLAINT / HPI:   Presents today wanting to discuss Ozempic side effects. Notes she has been on it for a long time. She has had multiple changes in the dosage related to shortage of the medication (bouncing from 1mg  to 2mg  doses several times). She has been getting headaches, indigestion, vomiting after recently going up to 2mg  dosage.   She has been improving in her diet and has been going to the gym. She feels her clothes are looser on her as well.   PERTINENT  PMH / PSH: HTN, T2DM, lupus  Patient Care Team: Erick Alley, DO as PCP - General (Family Medicine) Sallye Lat, MD as Consulting Physician (Ophthalmology) OBJECTIVE:  BP (!) 172/84   Pulse 83   Ht 5\' 2"  (1.575 m)   Wt 204 lb 6.4 oz (92.7 kg)   SpO2 100%   BMI 37.39 kg/m  General: Well-appearing, NAD Psych: Normal mood and affect  ASSESSMENT/PLAN:  Type 2 diabetes mellitus without complication, unspecified whether long term insulin use (HCC) Assessment & Plan: well controlled and improved - Last A1c: 7.6 today - Medications: Reduce semaglutide to 1 mg weekly given it experiencing side effects to 2 mg.  Formally discontinued metformin as she was not taking it. - Placed referral for ophthalmology diabetic eye exam - She will be due for repeat GFR and urine creatinine ratio at next visit, and A1c, follow-up in 3 months  Orders: -     POCT glycosylated hemoglobin (Hb A1C) -     Semaglutide (1 MG/DOSE); Inject 1 mg into the skin once a week.  Dispense: 3 mL; Refill: 2 -     Ambulatory referral to Ophthalmology -     Accu-Chek Aviva Plus; Use as instructed  Dispense: 100 each; Refill: 12  Essential hypertension, benign Assessment & Plan: BP: (!) 172/84 today. Poorly controlled. Goal of <130/80. Continue to work on healthy dietary habits and exercise. Follow up in 2 weeks.  Presented with option to start medication (my preference) or check at home.  She opted for latter option.  She has a arm  cuff at home and will check her blood pressure every day with the blood pressure log I have given her.   Return in about 2 weeks (around 12/20/2022) for Hypertension follow-up and labs for diabetes. Shelby Mattocks, DO 12/06/2022, 2:31 PM PGY-3, [facility]

## 2022-12-06 ENCOUNTER — Other Ambulatory Visit (HOSPITAL_COMMUNITY): Payer: Self-pay

## 2022-12-06 ENCOUNTER — Other Ambulatory Visit: Payer: Self-pay | Admitting: Student

## 2022-12-06 ENCOUNTER — Ambulatory Visit (INDEPENDENT_AMBULATORY_CARE_PROVIDER_SITE_OTHER): Payer: Medicare Other | Admitting: Student

## 2022-12-06 ENCOUNTER — Encounter: Payer: Self-pay | Admitting: Student

## 2022-12-06 ENCOUNTER — Other Ambulatory Visit: Payer: Self-pay

## 2022-12-06 VITALS — BP 172/84 | HR 83 | Ht 62.0 in | Wt 204.4 lb

## 2022-12-06 DIAGNOSIS — E119 Type 2 diabetes mellitus without complications: Secondary | ICD-10-CM | POA: Diagnosis not present

## 2022-12-06 DIAGNOSIS — I10 Essential (primary) hypertension: Secondary | ICD-10-CM | POA: Diagnosis not present

## 2022-12-06 LAB — POCT GLYCOSYLATED HEMOGLOBIN (HGB A1C): HbA1c, POC (controlled diabetic range): 7.6 % — AB (ref 0.0–7.0)

## 2022-12-06 MED ORDER — ACCU-CHEK AVIVA PLUS VI STRP
ORAL_STRIP | 12 refills | Status: DC
Start: 2022-12-06 — End: 2022-12-09
  Filled 2022-12-06: qty 100, fill #0

## 2022-12-06 MED ORDER — SEMAGLUTIDE (1 MG/DOSE) 4 MG/3ML ~~LOC~~ SOPN
1.0000 mg | PEN_INJECTOR | SUBCUTANEOUS | 2 refills | Status: DC
Start: 2022-12-06 — End: 2022-12-09
  Filled 2022-12-06 (×2): qty 3, 28d supply, fill #0

## 2022-12-06 NOTE — Patient Instructions (Addendum)
It was great to see you today! Thank you for choosing [facility] for your primary care.  Today we addressed: Diabetes: Excellent job, your A1c is 7.6.  I have formally discontinued your metformin and changed back to Ozempic 1 mg weekly injections as you are experiencing side effects to the 2 mg dosage.  I have placed a referral for an eye doctor.  Will recheck some labs at your next follow-up. Hypertension: Your blood pressure is very high.  We opted to go home with a blood pressure log and recheck in 2 weeks.  If you haven't already, sign up for My Chart to have easy access to your labs results, and communication with your primary care physician. I recommend that you always bring your medications to each appointment as this makes it easy to ensure you are on the correct medications and helps [place] not miss refills when you need them. Return in about 2 weeks (around 12/20/2022) for Hypertension follow-up and labs for diabetes. Please arrive 15 minutes before your appointment to ensure smooth check in process.  We appreciate your efforts in making this happen.  Thank you for allowing me to participate in your care, Shelby Mattocks, DO 12/06/2022, 2:28 PM PGY-3, [facility]   Blood Pressure Record Sheet To take your blood pressure, you will need a blood pressure machine. You can buy a blood pressure machine (blood pressure monitor) at your clinic, drug store, or online. When choosing one, consider: An automatic monitor that has an arm cuff. A cuff that wraps snugly around your upper arm. You should be able to fit only one finger between your arm and the cuff. A device that stores blood pressure reading results. Do not choose a monitor that measures your blood pressure from your wrist or finger. Follow your health care provider's instructions for how to take your blood pressure. To use this form: Take your blood pressure medications every day These measurements should be taken  when you have been at rest for at least 10-15 min Take at least 2 readings with each blood pressure check. This makes sure the results are correct. Wait 1-2 minutes between measurements. Write down the results in the spaces on this form. Keep in mind it should always be recorded systolic over diastolic. Both numbers are important.  Repeat this every day for 2-3 weeks, or as told by your health care provider.  Make a follow-up appointment with your health care provider to discuss the results.  Blood Pressure Log Date Medications taken? (Y/N) Blood Pressure Time of Day

## 2022-12-06 NOTE — Assessment & Plan Note (Signed)
BP: (!) 172/84 today. Poorly controlled. Goal of <130/80. Continue to work on healthy dietary habits and exercise. Follow up in 2 weeks.  Presented with option to start medication (my preference) or check at home.  She opted for latter option.  She has a arm cuff at home and will check her blood pressure every day with the blood pressure log I have given her.

## 2022-12-06 NOTE — Assessment & Plan Note (Signed)
well controlled and improved - Last A1c: 7.6 today - Medications: Reduce semaglutide to 1 mg weekly given it experiencing side effects to 2 mg.  Formally discontinued metformin as she was not taking it. - Placed referral for ophthalmology diabetic eye exam - She will be due for repeat GFR and urine creatinine ratio at next visit, and A1c, follow-up in 3 months

## 2022-12-09 ENCOUNTER — Other Ambulatory Visit: Payer: Self-pay | Admitting: Student

## 2022-12-09 ENCOUNTER — Telehealth: Payer: Self-pay

## 2022-12-09 DIAGNOSIS — E119 Type 2 diabetes mellitus without complications: Secondary | ICD-10-CM

## 2022-12-09 MED ORDER — SEMAGLUTIDE (1 MG/DOSE) 4 MG/3ML ~~LOC~~ SOPN: 1.0000 mg | PEN_INJECTOR | SUBCUTANEOUS | 0 refills | Status: DC

## 2022-12-09 MED ORDER — ACCU-CHEK AVIVA PLUS VI STRP
ORAL_STRIP | 9 refills | Status: AC
Start: 2022-12-09 — End: ?

## 2022-12-09 NOTE — Telephone Encounter (Signed)
Patient called and LVM regarding prescriptions from 12/06/22.   She reports that insurance prefers 90 day supply on her medications. Patient reports that she is charged the same amount for 30 and 90 day supply.  She is requesting that she receive updated prescriptions for Ozempic and testing strips to equal 90 day supply.   She reports that last rx allowed her to check her blood sugar up to 10 times per day.   Will forward request to PCP.   Veronda Prude, RN

## 2022-12-09 NOTE — Addendum Note (Signed)
Addended by: Howard Pouch on: 12/09/2022 01:23 PM   Modules accepted: Orders

## 2022-12-09 NOTE — Progress Notes (Signed)
PT request 3 month supply of ozempic. Sent to pharmacy

## 2022-12-12 ENCOUNTER — Other Ambulatory Visit: Payer: Self-pay | Admitting: Student

## 2022-12-12 ENCOUNTER — Other Ambulatory Visit (HOSPITAL_COMMUNITY): Payer: Self-pay

## 2022-12-12 DIAGNOSIS — E119 Type 2 diabetes mellitus without complications: Secondary | ICD-10-CM

## 2022-12-12 MED ORDER — ACCU-CHEK AVIVA PLUS VI STRP: ORAL_STRIP | 3 refills | Status: DC

## 2022-12-12 NOTE — Telephone Encounter (Signed)
Patient returns call to nurse line regarding testing strips.   She states that current prescription is only for 1 test strip per day.   She is asking that new prescription be sent to allow her to test up to 10 times per day. Also with request for 90 day supply.   Advised patient that insurance typically only covers for 4x per day. Patient states that in the past insurance has covered for up to 10 times per day.   Spoke with Dr. Miquel Dunn who provided verbal order for 900 test strips.   Veronda Prude, RN

## 2022-12-16 ENCOUNTER — Other Ambulatory Visit: Payer: Self-pay

## 2022-12-16 NOTE — Progress Notes (Signed)
   [name] 26-Jul-1965 [id number]  Patient outreached by Mack Guise , PharmD Candidate on 12/16/2022. Patient  Blood Pressure Readings: Last documented ambulatory systolic blood pressure: 172 Last documented ambulatory diastolic blood pressure: 84 Systolic BP today: 143 Diastolic BP today: 83 Does the patient have a validated home blood pressure machine?: Yes They report home readings 140s/80s  Medication review was performed. Is the patient taking their medications as prescribed?: Yes Differences from their prescribed list include: NA  The following barriers to adherence were noted: Does the patient have cost concerns?: No Does the patient have transportation concerns?: No Does the patient need assistance obtaining refills?: No Does the patient occassionally forget to take some of their prescribed medications?: No Does the patient feel like one/some of their medications make them feel poorly?: No Does the patient have questions or concerns about their medications?: No Does the patient have a follow up scheduled with their primary care provider/cardiologist?: Yes   Interventions: Interventions Completed: Medications were reviewed, Patient was educated on goal blood pressures and long term health implications of elevated blood pressure, Patient was educated on how to access home blood pressure machine  The patient has follow up scheduled:  PCP: Erick Alley, DO   Mack Guise, Student-PharmD

## 2022-12-23 ENCOUNTER — Other Ambulatory Visit: Payer: Self-pay

## 2022-12-23 ENCOUNTER — Ambulatory Visit (INDEPENDENT_AMBULATORY_CARE_PROVIDER_SITE_OTHER): Payer: Medicare Other | Admitting: Student

## 2022-12-23 ENCOUNTER — Encounter: Payer: Self-pay | Admitting: Student

## 2022-12-23 VITALS — BP 166/77 | HR 86 | Ht 62.0 in | Wt 204.6 lb

## 2022-12-23 DIAGNOSIS — I10 Essential (primary) hypertension: Secondary | ICD-10-CM

## 2022-12-23 MED ORDER — OLMESARTAN MEDOXOMIL 20 MG PO TABS: 20.0000 mg | ORAL_TABLET | Freq: Every day | ORAL | 0 refills | Status: DC

## 2022-12-23 NOTE — Patient Instructions (Signed)
It was great to see you! Thank you for allowing me to participate in your care!  I recommend that you always bring your medications to each appointment as this makes it easy to ensure you are on the correct medications and helps [place] not miss when refills are needed.  Our plans for today:  - Make apt in 2 weeks for BP check and labs - Please check BP daily and bring to next apt  We are checking some labs today, I will call you if they are abnormal will send you a MyChart message or a letter if they are normal.  If you do not hear about your labs in the next 2 weeks please let us know.  Take care and seek immediate care sooner if you develop any concerns.   Dr. Erick Alley, DO [facility]

## 2022-12-23 NOTE — Progress Notes (Signed)
    SUBJECTIVE:   CHIEF COMPLAINT / HPI:   HTN BP at home running 140's-160's/80's-high 90's.  Not currently on medication but willing to start  States she is feeling well today  PERTINENT  PMH / PSH: HTN, T2DM, SLE  OBJECTIVE:   BP (!) 166/77   Pulse 86   Ht 5\' 2"  (1.575 m)   Wt 204 lb 9.6 oz (92.8 kg)   SpO2 99%   BMI 37.42 kg/m    General: NAD, pleasant, able to participate in exam Cardiac: RRR, no murmurs. Respiratory: Breathing comfortably on room air Neuro: alert, no obvious focal deficits Psych: Normal affect and mood  ASSESSMENT/PLAN:   Essential hypertension, benign Uncontrolled, not on medication.  -Rx olmesartan 20 mg -Check BP at home daily and bring readings to next visit -BMP today -Return in 2 weeks for BP recheck and BMP     Dr. Erick Alley, DO [facility]

## 2022-12-23 NOTE — Assessment & Plan Note (Addendum)
Uncontrolled, not on medication.  -Rx olmesartan 20 mg -Check BP at home daily and bring readings to next visit -BMP today -Return in 2 weeks for BP recheck and BMP

## 2022-12-24 ENCOUNTER — Other Ambulatory Visit: Payer: Self-pay | Admitting: Student

## 2022-12-24 LAB — BASIC METABOLIC PANEL
BUN/Creatinine Ratio: 14 (ref 9–23)
BUN: 13 mg/dL (ref 6–24)
CO2: 23 mmol/L (ref 20–29)
Calcium: 9.6 mg/dL (ref 8.7–10.2)
Chloride: 101 mmol/L (ref 96–106)
Creatinine, Ser: 0.92 mg/dL (ref 0.57–1.00)
Glucose: 123 mg/dL — ABNORMAL HIGH (ref 70–99)
Potassium: 4.9 mmol/L (ref 3.5–5.2)
Sodium: 139 mmol/L (ref 134–144)
eGFR: 72 mL/min/{1.73_m2} (ref >59)

## 2023-01-06 ENCOUNTER — Encounter: Payer: Self-pay | Admitting: Pharmacist

## 2023-01-10 ENCOUNTER — Ambulatory Visit: Payer: Self-pay | Admitting: Student

## 2023-01-11 ENCOUNTER — Encounter: Payer: Self-pay | Admitting: Pharmacist

## 2023-01-12 ENCOUNTER — Other Ambulatory Visit: Payer: Self-pay | Admitting: Student

## 2023-01-20 ENCOUNTER — Ambulatory Visit: Payer: Medicare Other | Admitting: Student

## 2023-02-02 ENCOUNTER — Telehealth: Payer: Self-pay

## 2023-02-02 ENCOUNTER — Other Ambulatory Visit: Payer: Self-pay | Admitting: Student

## 2023-02-02 DIAGNOSIS — E119 Type 2 diabetes mellitus without complications: Secondary | ICD-10-CM

## 2023-02-02 MED ORDER — OZEMPIC (2 MG/DOSE) 8 MG/3ML ~~LOC~~ SOPN
2.0000 mg | PEN_INJECTOR | SUBCUTANEOUS | 2 refills | Status: DC
Start: 2023-02-02 — End: 2023-03-27

## 2023-02-02 NOTE — Telephone Encounter (Signed)
Patient calls nurse line requesting refill on Ozempic.   She is asking to be titrated back up to 2 mg dosing. She denies any adverse side effects.   If appropriate, please place new order for 2 mg pen to be sent to [facility] on [address].   Veronda Prude, RN

## 2023-02-23 ENCOUNTER — Other Ambulatory Visit: Payer: Self-pay

## 2023-03-14 ENCOUNTER — Telehealth: Payer: Self-pay

## 2023-03-14 NOTE — Patient Outreach (Signed)
Attempted outreach for care gaps-patient number is disconnected. [name], CMA Care Guide [employer]

## 2023-03-23 ENCOUNTER — Other Ambulatory Visit: Payer: Self-pay | Admitting: Student

## 2023-03-23 DIAGNOSIS — E119 Type 2 diabetes mellitus without complications: Secondary | ICD-10-CM

## 2023-03-27 ENCOUNTER — Other Ambulatory Visit: Payer: Self-pay

## 2023-03-27 DIAGNOSIS — E119 Type 2 diabetes mellitus without complications: Secondary | ICD-10-CM

## 2023-03-27 MED ORDER — OZEMPIC (2 MG/DOSE) 8 MG/3ML ~~LOC~~ SOPN
2.0000 mg | PEN_INJECTOR | SUBCUTANEOUS | 0 refills | Status: DC
Start: 2023-03-27 — End: 2023-04-14

## 2023-03-29 ENCOUNTER — Other Ambulatory Visit: Payer: Self-pay | Admitting: Student

## 2023-03-29 DIAGNOSIS — E1165 Type 2 diabetes mellitus with hyperglycemia: Secondary | ICD-10-CM

## 2023-04-13 NOTE — Progress Notes (Unsigned)
    SUBJECTIVE:   CHIEF COMPLAINT / HPI:   HTN Rx for olmesartan 20 mg on 12/23/22 which she did not start taking due to concern that it was not safe for her while also having diabetes.  T2DM A1c of 7.6 12/06/22  Previously prescribed Ozempic but not taking. Did okay on 1 mg but not 2 mg which caused GI symptoms. Currently not taking anything. Has been urinating a lot and very thirsty. Otherwise is feeling well. She does not want to take insulin.  Cannot tolerate metformin. Prefers to get back on ozempic and is willing to take an oral medication.  HLD Lipid panel in 2021 with LDL 154 (goal <100).  Previously given Rx for statin which she is no longer taking.   PERTINENT  PMH / PSH: HTN, T2DM, obesity, HLD  OBJECTIVE:   BP (!) 142/80   Pulse 85   Ht 5\' 2"  (1.575 m)   Wt 200 lb 3.2 oz (90.8 kg)   SpO2 97%   BMI 36.62 kg/m    General: NAD, pleasant, able to participate in exam Cardiac: RRR, no murmurs. Respiratory: CTAB, normal effort, No wheezes, rales or rhonchi Extremities: no edema  Skin: warm and dry, no rashes noted Neuro: alert, no obvious focal deficits Psych: Normal affect and mood  ASSESSMENT/PLAN:   Essential hypertension, benign Uncontrolled, not on medications -Rx Benicar 20 mg daily -Return in 2 weeks for RN BP check and BMP, Future lab order placed  Type 2 diabetes mellitus without complications (HCC) Uncontrolled with A1c 14.3, not currently on any medications and not willing to start insulin.  Cannot tolerate metformin and A1c is too high to start SGLT2. -Ozempic 0.25 mg injected weekly, will titrate up as able  -Glipizide XL 10 mg daily -Previously referred to ophthalmology for DM eye exam -micro UACR - f/u in ~ 1 month to ensure pt is taking meds and increase ozempic if appropriate    Hyperlipidemia associated with type 2 diabetes mellitus (HCC) Rx atorvastatin Lipid panel today   Health maintenance Mammogram ordered, can discuss colonoscopy and  vaccinations at future visit  Dr. Erick Alley, DO [facility]

## 2023-04-14 ENCOUNTER — Ambulatory Visit (INDEPENDENT_AMBULATORY_CARE_PROVIDER_SITE_OTHER): Payer: Medicare Other | Admitting: Student

## 2023-04-14 ENCOUNTER — Encounter: Payer: Self-pay | Admitting: Student

## 2023-04-14 VITALS — BP 142/80 | HR 85 | Ht 62.0 in | Wt 200.2 lb

## 2023-04-14 DIAGNOSIS — E785 Hyperlipidemia, unspecified: Secondary | ICD-10-CM

## 2023-04-14 DIAGNOSIS — E1169 Type 2 diabetes mellitus with other specified complication: Secondary | ICD-10-CM

## 2023-04-14 DIAGNOSIS — E119 Type 2 diabetes mellitus without complications: Secondary | ICD-10-CM

## 2023-04-14 DIAGNOSIS — I10 Essential (primary) hypertension: Secondary | ICD-10-CM | POA: Diagnosis not present

## 2023-04-14 DIAGNOSIS — Z1239 Encounter for other screening for malignant neoplasm of breast: Secondary | ICD-10-CM

## 2023-04-14 LAB — POCT GLYCOSYLATED HEMOGLOBIN (HGB A1C): HbA1c, POC (controlled diabetic range): 14.3 % — AB (ref 0.0–7.0)

## 2023-04-14 MED ORDER — SEMAGLUTIDE(0.25 OR 0.5MG/DOS) 2 MG/1.5ML ~~LOC~~ SOPN: 0.2500 mg | PEN_INJECTOR | SUBCUTANEOUS | 0 refills | Status: DC

## 2023-04-14 MED ORDER — ATORVASTATIN CALCIUM 20 MG PO TABS: 20.0000 mg | ORAL_TABLET | Freq: Every day | ORAL | 2 refills | Status: DC

## 2023-04-14 MED ORDER — OLMESARTAN MEDOXOMIL 20 MG PO TABS: 20.0000 mg | ORAL_TABLET | Freq: Every day | ORAL | 0 refills | Status: DC

## 2023-04-14 MED ORDER — GLIPIZIDE ER 10 MG PO TB24: 10.0000 mg | ORAL_TABLET | Freq: Every day | ORAL | 0 refills | Status: DC

## 2023-04-14 NOTE — Patient Instructions (Signed)
It was great to see you! Thank you for allowing me to participate in your care!  I recommend that you always bring your medications to each appointment as this makes it easy to ensure you are on the correct medications and helps [place] not miss when refills are needed.  Our plans for today:  - For diabetes: Glipizide (daily pill) and Ozempic (weekly injection) sent to pharmacy  - For blood pressure: Olmasartan sent to pharmacy - For cholesterol, lipitor sent  -return in 2 weeks for blood pressure check with me  -[facility] to schedule mamm[facility]: Address: [address], [place], [place] [address] Phone: [phone]  We are checking some labs today, I will call you if they are abnormal will send you a MyChart message or a letter if they are normal.  If you do not hear about your labs in the next 2 weeks please let us know.  Take care and seek immediate care sooner if you develop any concerns.   Dr. Erick Alley, DO [facility]

## 2023-04-15 ENCOUNTER — Encounter: Payer: Self-pay | Admitting: Student

## 2023-04-15 ENCOUNTER — Other Ambulatory Visit: Payer: Self-pay | Admitting: Student

## 2023-04-15 DIAGNOSIS — E1169 Type 2 diabetes mellitus with other specified complication: Secondary | ICD-10-CM | POA: Insufficient documentation

## 2023-04-15 DIAGNOSIS — E119 Type 2 diabetes mellitus without complications: Secondary | ICD-10-CM

## 2023-04-15 HISTORY — DX: Type 2 diabetes mellitus with other specified complication: E11.69

## 2023-04-15 LAB — MICROALBUMIN / CREATININE URINE RATIO
Creatinine, Urine: 56.7 mg/dL
Microalb/Creat Ratio: 18 mg/g{creat} (ref 0–29)
Microalbumin, Urine: 10 ug/mL

## 2023-04-15 LAB — LIPID PANEL
Chol/HDL Ratio: 4.3 {ratio} (ref 0.0–4.4)
Cholesterol, Total: 203 mg/dL — ABNORMAL HIGH (ref 100–199)
HDL: 47 mg/dL (ref >39)
LDL Chol Calc (NIH): 129 mg/dL — ABNORMAL HIGH (ref 0–99)
Triglycerides: 150 mg/dL — ABNORMAL HIGH (ref 0–149)
VLDL Cholesterol Cal: 27 mg/dL (ref 5–40)

## 2023-04-15 NOTE — Assessment & Plan Note (Signed)
Uncontrolled, not on medications -Rx Benicar 20 mg daily -Return in 2 weeks for RN BP check and BMP, Future lab order placed

## 2023-04-15 NOTE — Assessment & Plan Note (Addendum)
Uncontrolled with A1c 14.3, not currently on any medications and not willing to start insulin.  Cannot tolerate metformin and A1c is too high to start SGLT2. -Ozempic 0.25 mg injected weekly, will titrate up as able  -Glipizide XL 10 mg daily -Previously referred to ophthalmology for DM eye exam -micro UACR - f/u in ~ 1 month to ensure pt is taking meds and increase ozempic if appropriate

## 2023-04-15 NOTE — Assessment & Plan Note (Signed)
Rx atorvastatin Lipid panel today

## 2023-04-28 ENCOUNTER — Encounter: Payer: Self-pay | Admitting: Family Medicine

## 2023-04-28 ENCOUNTER — Ambulatory Visit (INDEPENDENT_AMBULATORY_CARE_PROVIDER_SITE_OTHER): Payer: Medicare Other | Admitting: Family Medicine

## 2023-04-28 VITALS — BP 116/70 | HR 86 | Wt 197.0 lb

## 2023-04-28 DIAGNOSIS — J189 Pneumonia, unspecified organism: Secondary | ICD-10-CM

## 2023-04-28 DIAGNOSIS — J069 Acute upper respiratory infection, unspecified: Secondary | ICD-10-CM | POA: Diagnosis not present

## 2023-04-28 MED ORDER — DOXYCYCLINE HYCLATE 100 MG PO TABS: 100.0000 mg | ORAL_TABLET | Freq: Two times a day (BID) | ORAL | 0 refills | Status: DC

## 2023-04-28 NOTE — Assessment & Plan Note (Signed)
 Patient with 2-week history of chest feeling heavy and having some difficulty breathing.  Has had mild cough on and off.  Denies fevers or congestion.  No sick contacts.  Lungs clear to auscultation bilaterally, normal work of breathing on room air.  Pulse ox in high 90s. Likely viral illness though do have some suspicion for pneumonia.  Given patient's history of lupus, will treat with doxycycline .  If symptoms or not improving in the next week, patient instructed to return.  Will consider chest x-ray at that time. - doxycycline  100 mg BID for 7 days

## 2023-04-28 NOTE — Patient Instructions (Signed)
 It was great to see you today! Thank you for choosing [facility] for your primary care. [name] was seen for shortness of breathing and feeling ill.  Today we addressed: Your illness. I believe you have a viral infection, but given your immunocomprised due to your Lupus, we will treat for pneumonia. If you are not feeling better in the next week, please return and we will get a chest xray.   I recommend that you always bring your medications to each appointment as this makes it easy to ensure you are on the correct medications and helps us  not miss refills when you need them.  Please arrive 15 minutes before your appointment to ensure smooth check in process.  We appreciate your efforts in making this happen.  Please call the clinic at [phone] if your symptoms worsen or you have any concerns.  Thank you for allowing me to participate in your care, Gloriann Ogren, MD 04/28/2023, 11:55 AM PGY-1, [facility]

## 2023-04-28 NOTE — Progress Notes (Signed)
    SUBJECTIVE:   CHIEF COMPLAINT / HPI:  Sick for two weeks. Chest is heavy and is having difficulty breathing. Has been getting better and worse over the past two weeks. Also having chills. Reports some cough but not much. No chest pain.   No fevers. No congestion. No sick contacts.    PERTINENT  PMH / PSH:  Lupus   OBJECTIVE:   BP 116/70   Pulse 86   Wt 197 lb (89.4 kg)   SpO2 96%   BMI 36.03 kg/m   General: A&O, NAD HEENT: No sign of trauma, EOM grossly intact Cardiac: RRR, no m/r/g Respiratory: CTAB, normal WOB, no w/c/r GI: Soft, NTTP, non-distended  Extremities: NTTP, no peripheral edema. Neuro: Normal gait, moves all four extremities appropriately. Psych: Appropriate mood and affect   ASSESSMENT/PLAN:   URI (upper respiratory infection) Patient with 2-week history of chest feeling heavy and having some difficulty breathing.  Has had mild cough on and off.  Denies fevers or congestion.  No sick contacts.  Lungs clear to auscultation bilaterally, normal work of breathing on room air.  Pulse ox in high 90s. Likely viral illness though do have some suspicion for pneumonia.  Given patient's history of lupus, will treat with doxycycline .  If symptoms or not improving in the next week, patient instructed to return.  Will consider chest x-ray at that time. - doxycycline  100 mg BID for 7 days      Gloriann Ogren, MD [facility]

## 2023-05-06 ENCOUNTER — Other Ambulatory Visit: Payer: Self-pay | Admitting: Student

## 2023-05-06 DIAGNOSIS — I10 Essential (primary) hypertension: Secondary | ICD-10-CM

## 2023-05-08 ENCOUNTER — Telehealth: Payer: Self-pay | Admitting: Student

## 2023-05-08 NOTE — Telephone Encounter (Signed)
 Patient calls nurse line in regards to [facility] prescription.   She reports she wants to stay on the 0.25 dosage.  She reports she is due to take her next injection on Friday 01/17.  Will forward to PCP.

## 2023-05-09 ENCOUNTER — Other Ambulatory Visit: Payer: Self-pay | Admitting: Student

## 2023-05-09 DIAGNOSIS — E119 Type 2 diabetes mellitus without complications: Secondary | ICD-10-CM

## 2023-05-11 ENCOUNTER — Ambulatory Visit: Payer: Medicare Other

## 2023-05-12 ENCOUNTER — Ambulatory Visit: Payer: Medicare Other

## 2023-05-17 ENCOUNTER — Other Ambulatory Visit: Payer: Self-pay | Admitting: Student

## 2023-06-04 ENCOUNTER — Other Ambulatory Visit: Payer: Self-pay | Admitting: Student

## 2023-06-04 DIAGNOSIS — E119 Type 2 diabetes mellitus without complications: Secondary | ICD-10-CM

## 2023-06-05 ENCOUNTER — Ambulatory Visit (INDEPENDENT_AMBULATORY_CARE_PROVIDER_SITE_OTHER): Payer: Medicare Other | Admitting: Student

## 2023-06-05 ENCOUNTER — Encounter: Payer: Self-pay | Admitting: Student

## 2023-06-05 VITALS — BP 171/75 | HR 85 | Ht 62.0 in | Wt 196.4 lb

## 2023-06-05 DIAGNOSIS — I10 Essential (primary) hypertension: Secondary | ICD-10-CM | POA: Diagnosis not present

## 2023-06-05 DIAGNOSIS — H5789 Other specified disorders of eye and adnexa: Secondary | ICD-10-CM

## 2023-06-05 DIAGNOSIS — R052 Subacute cough: Secondary | ICD-10-CM

## 2023-06-05 DIAGNOSIS — R053 Chronic cough: Secondary | ICD-10-CM

## 2023-06-05 DIAGNOSIS — E119 Type 2 diabetes mellitus without complications: Secondary | ICD-10-CM | POA: Diagnosis not present

## 2023-06-05 MED ORDER — GLIPIZIDE ER 5 MG PO TB24: 5.0000 mg | ORAL_TABLET | Freq: Every day | ORAL | 0 refills | Status: DC

## 2023-06-05 MED ORDER — AMLODIPINE-OLMESARTAN 5-20 MG PO TABS: 1.0000 | ORAL_TABLET | Freq: Every day | ORAL | 0 refills | Status: DC

## 2023-06-05 MED ORDER — ALBUTEROL SULFATE HFA 108 (90 BASE) MCG/ACT IN AERS: 2.0000 | INHALATION_SPRAY | RESPIRATORY_TRACT | 1 refills | Status: DC | PRN

## 2023-06-05 NOTE — Progress Notes (Signed)
    SUBJECTIVE:   CHIEF COMPLAINT / HPI:   T2DM Currently taking Ozempic 0.25 mg injected weekly and glipizide XL 10 mg daily Last A1c 14.3 on 12/20. Has been taking Ozempic 0.25 mg and tolerating it well, would like to increase to 0.5 mg dose. Cannot tolerate metformin, A1c was too high to start SGLT2. Fasting sugars at home - running 99 to 130  HTN Prescribed Benicar 20 mg daily 04/14/2023 which she has been taking sometimes but not regularly, did not take today. Eyes irritated but otherwise feeling well today. No headache, CP, SOB.   Eye irritation Bilateral eyes - feel like she is straining and eyes are tearing. Denies pain and blurry vision. Has been going on for months but acutely worse today.  Does not feel like her eyes are bulging.   PERTINENT  PMH / PSH: HTN, T2DM, HLD, lupus  OBJECTIVE:   BP (!) 171/75   Pulse 85   Ht 5\' 2"  (1.575 m)   Wt 196 lb 6.4 oz (89.1 kg)   SpO2 100%   BMI 35.92 kg/m    General: NAD, pleasant HEENT: White sclera, mildly injected conjunctiva at bilateral lateral portion of eyes with bilateral exophthalmos (R>L), PERRL, EOEMI without pain, no lid edema or drainage.  When eyes are closed and palpated right feels tense than left Cardiac: well perfused Respiratory: normal effort Skin: warm and dry Neuro: Cranial nerves II through XII intact, sensation intact, finger-nose-finger test normal Psych: Normal affect and mood  ASSESSMENT/PLAN:   Essential hypertension, benign Uncontrolled but not taking medications regularly.  Neuro exam reassuring.  Will likely need second agent.  -DC Benicar -Rx Azor 5-20 mg -BMP today considering she has been taking Benicar off-and-on since last prescribed -Return in 2 weeks for BP check  Eye irritation Have some concern for exophthalmos however this could be patient's baseline given she does not feel her eyes are bulging.  Reassuringly, she denies visual changes and pain making acute angle-closure glaucoma  less likely.  Could consider viral conjunctivitis but no URI symptoms at this time and bacterial conjunctivitis less likely given no lid edema, significant drainage.  Due to concern for increased pressure, I called on-call ophthalmologist, Dr. Genia Del, who has scheduled patient to be seen in his office at 9 AM tomorrow morning.  Subacute cough Lingering cough after likely viral respiratory infection just over a month ago.  No SOB, was able to walk a few miles to get to her appointment today.  Otherwise feeling well.  Unfortunately, did not get to listen to lungs as request for albuterol and discussion of cough was brought up as we were walking her out the door.  She is breathing comfortably on room air with SpO2 of 100%. -Refilled albuterol -Patient to return in 2 weeks or sooner if needed, can do lung exam and consider PFTs given smoking history  Type 2 diabetes mellitus without complications (HCC) Given fasting sugars running 99-1 30, will decrease glipizide to 5 mg XL daily and increase Ozempic to 0.5 mg injected weekly. -Patient to return in 1 month for A1c -Consider DC glipizide and initiating SGLT2 if appropriate       Dr. Erick Alley, DO  [facility]

## 2023-06-05 NOTE — Patient Instructions (Signed)
 It was great to see you! Thank you for allowing me to participate in your care!  I recommend that you always bring your medications to each appointment as this makes it easy to ensure you are on the correct medications and helps us  not miss when refills are needed.  Our plans for today:  - Go to your appointment with Dr. Alto Atta Address: [address] [place], [place] [address]  Tomorrow morning at 9:00 am - I reduced your dose of glipizide  to 5 mg daily - we increased your ozempic  to 0.5 mg injected weekly - I sent in a new blood pressure medication, a combination pill including Olmesartan  20 mg and amlodipine  5 mg. Take once daily.  -Return in 2 weeks for blood pressure check, schedule this on your way out.    Take care and seek immediate care sooner if you develop any concerns.   Dr. Glenn Lange, DO [facility]

## 2023-06-06 ENCOUNTER — Encounter: Payer: Self-pay | Admitting: Student

## 2023-06-06 DIAGNOSIS — R052 Subacute cough: Secondary | ICD-10-CM | POA: Insufficient documentation

## 2023-06-06 DIAGNOSIS — H5789 Other specified disorders of eye and adnexa: Secondary | ICD-10-CM

## 2023-06-06 HISTORY — DX: Other specified disorders of eye and adnexa: H57.89

## 2023-06-06 LAB — BASIC METABOLIC PANEL
BUN/Creatinine Ratio: 11 (ref 9–23)
BUN: 10 mg/dL (ref 6–24)
CO2: 25 mmol/L (ref 20–29)
Calcium: 9.5 mg/dL (ref 8.7–10.2)
Chloride: 98 mmol/L (ref 96–106)
Creatinine, Ser: 0.92 mg/dL (ref 0.57–1.00)
Glucose: 241 mg/dL — ABNORMAL HIGH (ref 70–99)
Potassium: 4.6 mmol/L (ref 3.5–5.2)
Sodium: 139 mmol/L (ref 134–144)
eGFR: 72 mL/min/{1.73_m2} (ref >59)

## 2023-06-06 NOTE — Assessment & Plan Note (Signed)
Given fasting sugars running 99-1 30, will decrease glipizide to 5 mg XL daily and increase Ozempic to 0.5 mg injected weekly. -Patient to return in 1 month for A1c -Consider DC glipizide and initiating SGLT2 if appropriate

## 2023-06-06 NOTE — Assessment & Plan Note (Signed)
Uncontrolled but not taking medications regularly.  Neuro exam reassuring.  Will likely need second agent.  -DC Benicar -Rx Azor 5-20 mg -BMP today considering she has been taking Benicar off-and-on since last prescribed -Return in 2 weeks for BP check

## 2023-06-06 NOTE — Assessment & Plan Note (Signed)
Lingering cough after likely viral respiratory infection just over a month ago.  No SOB, was able to walk a few miles to get to her appointment today.  Otherwise feeling well.  Unfortunately, did not get to listen to lungs as request for albuterol and discussion of cough was brought up as we were walking her out the door.  She is breathing comfortably on room air with SpO2 of 100%. -Refilled albuterol -Patient to return in 2 weeks or sooner if needed, can do lung exam and consider PFTs given smoking history

## 2023-06-06 NOTE — Assessment & Plan Note (Addendum)
Have some concern for exophthalmos however this could be patient's baseline given she does not feel her eyes are bulging.  Reassuringly, she denies visual changes and pain making acute angle-closure glaucoma less likely.  Could consider viral conjunctivitis but no URI symptoms at this time and bacterial conjunctivitis less likely given no lid edema, significant drainage.  Due to concern for increased pressure, I called on-call ophthalmologist, Dr. Genia Del, who has scheduled patient to be seen in his office at 9 AM tomorrow morning.

## 2023-06-07 ENCOUNTER — Other Ambulatory Visit: Payer: Self-pay | Admitting: Student

## 2023-06-07 DIAGNOSIS — E1169 Type 2 diabetes mellitus with other specified complication: Secondary | ICD-10-CM

## 2023-06-11 ENCOUNTER — Other Ambulatory Visit: Payer: Self-pay | Admitting: Student

## 2023-06-11 DIAGNOSIS — E119 Type 2 diabetes mellitus without complications: Secondary | ICD-10-CM

## 2023-06-20 ENCOUNTER — Ambulatory Visit: Payer: Medicare Other | Admitting: Student

## 2023-06-30 ENCOUNTER — Ambulatory Visit (INDEPENDENT_AMBULATORY_CARE_PROVIDER_SITE_OTHER): Payer: Medicare Other | Admitting: Student

## 2023-06-30 ENCOUNTER — Encounter: Payer: Self-pay | Admitting: Student

## 2023-06-30 VITALS — BP 163/82 | HR 78 | Ht 62.0 in | Wt 191.8 lb

## 2023-06-30 DIAGNOSIS — L84 Corns and callosities: Secondary | ICD-10-CM | POA: Diagnosis not present

## 2023-06-30 DIAGNOSIS — I10 Essential (primary) hypertension: Secondary | ICD-10-CM | POA: Diagnosis not present

## 2023-06-30 DIAGNOSIS — E119 Type 2 diabetes mellitus without complications: Secondary | ICD-10-CM

## 2023-06-30 DIAGNOSIS — H5789 Other specified disorders of eye and adnexa: Secondary | ICD-10-CM

## 2023-06-30 LAB — POCT GLYCOSYLATED HEMOGLOBIN (HGB A1C): HbA1c, POC (controlled diabetic range): 12 % — AB (ref 0.0–7.0)

## 2023-06-30 MED ORDER — GLIPIZIDE ER 10 MG PO TB24
10.0000 mg | ORAL_TABLET | Freq: Every day | ORAL | 3 refills | Status: DC
Start: 2023-06-30 — End: 2023-07-17

## 2023-06-30 MED ORDER — OZEMPIC (0.25 OR 0.5 MG/DOSE) 2 MG/3ML ~~LOC~~ SOPN: 0.5000 mg | PEN_INJECTOR | SUBCUTANEOUS | 3 refills | Status: DC

## 2023-06-30 NOTE — Patient Instructions (Addendum)
 It was great to see you! Thank you for allowing me to participate in your care!  Our plans for today:  -Refill of Ozempic sent to pharmacy.  Continue 0.5 mg injected weekly -We will increase glipizide back to 10 mg daily.  New prescription sent to pharmacy -Start taking the Azor blood pressure medication daily -Return in 2 weeks for blood pressure check and labs -Referral sent to podiatry.  You will be called to schedule appointment    Take care and seek immediate care sooner if you develop any concerns.   Dr. Erick Alley, DO [facility]

## 2023-06-30 NOTE — Progress Notes (Cosign Needed Addendum)
    SUBJECTIVE:   CHIEF COMPLAINT / HPI:   T2DM Currently taking glipizide 5 mg XL, increased Ozempic 0.5 mg weekly at last visit. Only complaint is significantly decreased apatite during first 1-2 days after injection -has to make herself eat small amounts, improves thereafter.  Has not been checking fasting blood sugars.  HTN Previously prescribed Azor 5-20 mg. Only took it once about ~2 weeks ago, felt mildly light headed for about an hour which resolved and didn't take it again.  States for some reason she is very anxious about the diagnosis of HTN and hesitant to take medications for it.  Left foot pain States she has a thickened callus on the left foot which has been causing her pain for years, requests referral to podiatry.  Eye irritation Seen 06/05/2023 referred to ophthalmology due to feeling like her eyes were straining and tearing -still having the symptoms but slightly improved.  Missed appointment with ophthalmology due to transportation issues.   PERTINENT  PMH / PSH: SLE, T2DM, HTN  OBJECTIVE:   BP (!) 163/82   Pulse 78   Ht 5\' 2"  (1.575 m)   Wt 191 lb 12.8 oz (87 kg)   SpO2 98%   BMI 35.08 kg/m    General: NAD, pleasant, able to participate in exam HEENT: White sclera, mildly injected conjunctiva at lateral portion of eyes consistent with prior exam, EOEMI without pain, no drainage or lid edema, PEPRLA Cardiac: RRR, no murmurs. Lungs: CTAB, normal effort  Extremities: no edema or cyanosis.  Dorsalis pedis and posterior tibial pulses palpated, no wounds or deformities, sensation intact to monofilament testing.  Thickened tender callus of ball of R foot  Skin: warm and dry Neuro: alert, no obvious focal deficits Psych: Normal affect and mood  ASSESSMENT/PLAN:   Type 2 diabetes mellitus without complications (HCC) Uncontrolled with A1c 12. Shared decision making used, pt prefers not to increase Ozempic given signififcant decreased appetite.  -Continue Ozempic  0.5 mg injected weekly -Incrase glipizide back to 10 mg XL, advised to check fasting blood sugars to ensure she is not having lows -Plan to initiate SGLT2 when under better control -Previously referred to ophthalmology for eye exam -DM foot exam completed today and non concerning   Eye irritation Slightly improved from last month but still with straining and tearing.  -Patient to reschedule appointment with ophthalmology since she missed her one  Corn of foot Corn of left foot causing pain for years, worsening. -Referral to podiatry  Essential hypertension, benign Uncontrolled, not taking prescribed medication.  Patient plans to start Azor 5-20 mg which she has at home and plans to take the medication consistently prior to her next visit  -f/u 2 weeks for BP check and BMP     Dr. Erick Alley, DO [facility]

## 2023-06-30 NOTE — Assessment & Plan Note (Addendum)
 Uncontrolled with A1c 12. Shared decision making used, pt prefers not to increase Ozempic given signififcant decreased appetite.  -Continue Ozempic 0.5 mg injected weekly -Incrase glipizide back to 10 mg XL, advised to check fasting blood sugars to ensure she is not having lows -Plan to initiate SGLT2 when under better control -Previously referred to ophthalmology for eye exam -DM foot exam completed today and non concerning

## 2023-07-01 DIAGNOSIS — L84 Corns and callosities: Secondary | ICD-10-CM | POA: Insufficient documentation

## 2023-07-01 NOTE — Assessment & Plan Note (Signed)
 Uncontrolled, not taking prescribed medication.  Patient plans to start Azor 5-20 mg which she has at home and plans to take the medication consistently prior to her next visit  -f/u 2 weeks for BP check and BMP

## 2023-07-01 NOTE — Assessment & Plan Note (Signed)
 Corn of left foot causing pain for years, worsening. -Referral to podiatry

## 2023-07-01 NOTE — Assessment & Plan Note (Signed)
 Slightly improved from last month but still with straining and tearing.  -Patient to reschedule appointment with ophthalmology since she missed her one

## 2023-07-04 ENCOUNTER — Other Ambulatory Visit (HOSPITAL_COMMUNITY): Payer: Self-pay

## 2023-07-07 ENCOUNTER — Other Ambulatory Visit: Payer: Self-pay | Admitting: Student

## 2023-07-07 DIAGNOSIS — Z5982 Transportation insecurity: Secondary | ICD-10-CM

## 2023-07-07 NOTE — Progress Notes (Signed)
 Order placed for care management per pt request d/t transportation insecurity

## 2023-07-10 ENCOUNTER — Telehealth: Payer: Self-pay | Admitting: Pharmacist

## 2023-07-10 ENCOUNTER — Telehealth: Payer: Self-pay

## 2023-07-10 NOTE — Telephone Encounter (Signed)
 Attempted to contact patient for follow-up of recent elevated A1C.   Left HIPAA compliant voice mail requesting call back to direct phone: [phone]  Total time with patient call and documentation of interaction: 3 minutes.

## 2023-07-10 NOTE — Progress Notes (Signed)
   Telephone encounter was:  Unsuccessful.  07/10/2023 Name: [name] MRN: [id number] DOB: 1966-02-28  Unsuccessful outbound call made today to assist with:  Transportation Needs   Outreach Attempt:  1st Attempt  A HIPAA compliant voice message was left requesting a return call.  Instructed patient to call back    Lenard Forth [facility] Guide, Phone: [phone] Fax: [phone] Website: [email or web address]

## 2023-07-11 ENCOUNTER — Other Ambulatory Visit: Payer: Self-pay | Admitting: Student

## 2023-07-11 ENCOUNTER — Telehealth: Payer: Self-pay | Admitting: Pharmacist

## 2023-07-11 DIAGNOSIS — E119 Type 2 diabetes mellitus without complications: Secondary | ICD-10-CM

## 2023-07-11 MED ORDER — SEMAGLUTIDE (1 MG/DOSE) 4 MG/3ML ~~LOC~~ SOPN
1.0000 mg | PEN_INJECTOR | SUBCUTANEOUS | 2 refills | Status: DC
Start: 2023-07-11 — End: 2023-07-31

## 2023-07-11 NOTE — Telephone Encounter (Signed)
 Patient returned call from 3/17 and requested call back.   Within 1 hour of her call, I attempted to contact patient for follow-up of her glucose control with A1C of 12 on 06/30/2023   Left HIPAA compliant voice mail requesting call back to direct phone: [phone]  OR to call main number and schedule with me at her convenience.   Potential Liberate Trial patient.   Total time with patient call and documentation of interaction: 9 minutes.

## 2023-07-11 NOTE — Telephone Encounter (Signed)
 Patient returns call and clarifies need for GLP - Ozempic (semaglutide)  Following discussion, we agreed to increase dose from 0.5mg  to 1mg  weekly. New prescription provided.   Patient also interested in using / considering CGM in Liberate study.   Agreed to schedule visit for Liberate potential enrollment 3/28 when she is seeing Dr. Yetta Barre.   Medication Plan: -Increase Ozempic (semaglutide) from 0.5mg  weekly to 1mg  weekly.    Total time with patient call and documentation of interaction: 13 minutes.

## 2023-07-11 NOTE — Assessment & Plan Note (Signed)
 Following discussion, we agreed to increase dose from 0.5mg  to 1mg  weekly. New prescription provided.   Patient also interested in using / considering CGM in Liberate study.   Agreed to schedule visit for Liberate potential enrollment 3/28 when she is seeing Dr. Yetta Barre.

## 2023-07-12 NOTE — Telephone Encounter (Signed)
 Reviewed and agree with Dr Macky Lower plan.

## 2023-07-13 ENCOUNTER — Telehealth: Payer: Self-pay

## 2023-07-13 NOTE — Progress Notes (Signed)
   Telephone encounter was:  Unsuccessful.  07/13/2023 Name: [name] MRN: [id number] DOB: April 29, 1965  Unsuccessful outbound call made today to assist with:  Transportation Needs   Outreach Attempt:  2nd Attempt  A HIPAA compliant voice message was left requesting a return call.  Instructed patient to call back   Lenard Forth [facility] Guide, Phone: [phone] Fax: [phone] Website: [email or web address]

## 2023-07-14 ENCOUNTER — Telehealth: Payer: Self-pay

## 2023-07-14 NOTE — Progress Notes (Signed)
   Telephone encounter was:  Unsuccessful.  07/14/2023 Name: [name] MRN: [id number] DOB: [birth year]  Unsuccessful outbound call made today to assist with:  Transportation Needs   Outreach Attempt:  3rd Attempt.  Referral closed unable to contact patient.  A HIPAA compliant voice message was left requesting a return call.  Instructed patient to call back   Lenard Forth [facility] Guide, Phone: [phone] Fax: [phone] Website: [email or web address]

## 2023-07-17 ENCOUNTER — Other Ambulatory Visit: Payer: Self-pay

## 2023-07-17 DIAGNOSIS — R052 Subacute cough: Secondary | ICD-10-CM

## 2023-07-17 DIAGNOSIS — E1169 Type 2 diabetes mellitus with other specified complication: Secondary | ICD-10-CM

## 2023-07-17 DIAGNOSIS — I10 Essential (primary) hypertension: Secondary | ICD-10-CM

## 2023-07-17 DIAGNOSIS — E119 Type 2 diabetes mellitus without complications: Secondary | ICD-10-CM

## 2023-07-17 MED ORDER — GLIPIZIDE ER 10 MG PO TB24: 10.0000 mg | ORAL_TABLET | Freq: Every day | ORAL | 3 refills | Status: DC

## 2023-07-17 MED ORDER — ATORVASTATIN CALCIUM 20 MG PO TABS: 20.0000 mg | ORAL_TABLET | Freq: Every day | ORAL | 2 refills | Status: DC

## 2023-07-17 MED ORDER — ALBUTEROL SULFATE HFA 108 (90 BASE) MCG/ACT IN AERS: 2.0000 | INHALATION_SPRAY | RESPIRATORY_TRACT | 1 refills | Status: DC | PRN

## 2023-07-17 MED ORDER — AMLODIPINE-OLMESARTAN 5-20 MG PO TABS: 1.0000 | ORAL_TABLET | Freq: Every day | ORAL | 0 refills | Status: DC

## 2023-07-21 ENCOUNTER — Encounter: Payer: Self-pay | Admitting: Pharmacist

## 2023-07-21 ENCOUNTER — Ambulatory Visit: Admitting: Student

## 2023-07-21 ENCOUNTER — Encounter: Admitting: Pharmacist

## 2023-07-21 ENCOUNTER — Other Ambulatory Visit

## 2023-07-21 ENCOUNTER — Encounter: Payer: Self-pay | Admitting: Student

## 2023-07-21 VITALS — BP 152/78 | HR 78 | Wt 188.0 lb

## 2023-07-21 VITALS — BP 154/86 | HR 82 | Ht 62.0 in | Wt 190.4 lb

## 2023-07-21 DIAGNOSIS — E1169 Type 2 diabetes mellitus with other specified complication: Secondary | ICD-10-CM

## 2023-07-21 DIAGNOSIS — E119 Type 2 diabetes mellitus without complications: Secondary | ICD-10-CM

## 2023-07-21 DIAGNOSIS — Z1231 Encounter for screening mammogram for malignant neoplasm of breast: Secondary | ICD-10-CM | POA: Diagnosis not present

## 2023-07-21 DIAGNOSIS — Z1211 Encounter for screening for malignant neoplasm of colon: Secondary | ICD-10-CM

## 2023-07-21 DIAGNOSIS — I10 Essential (primary) hypertension: Secondary | ICD-10-CM

## 2023-07-21 DIAGNOSIS — E785 Hyperlipidemia, unspecified: Secondary | ICD-10-CM

## 2023-07-21 LAB — POCT GLYCOSYLATED HEMOGLOBIN (HGB A1C): HbA1c, POC (controlled diabetic range): 10.8 % — AB (ref 0.0–7.0)

## 2023-07-21 NOTE — Progress Notes (Signed)
    SUBJECTIVE:   CHIEF COMPLAINT / HPI:   The patient, with a history of hypertension and diabetes, presents with issues related to medication tolerance.   At last visit we discussed taking Ozempic 0.5 mg weekly.  However patient states she could not get the 0.5 mg dosage from the pharmacy and only had the 1 mg dosage which is what she has been taking. She reports experiencing daily vomiting while taking Ozempic 1 mg, which has resulted in difficulty keeping down her medication.  Has not vomited in the last 4 days but did not take blood pressure medication this morning. She saw Dr. Raymondo Band this morning - have decided to manipulate the dosage to effectively deliver a 0.5mg  dose.    PERTINENT  PMH / PSH: HTN, T2DM, lupus  OBJECTIVE:   BP (!) 154/86   Pulse 82   Ht 5\' 2"  (1.575 m)   Wt 190 lb 6.4 oz (86.4 kg)   SpO2 100%   BMI 34.82 kg/m    General: NAD, pleasant, able to participate in exam Cardiac: RRR, no murmurs. Respiratory: CTAB, normal effort, No wheezes, rales or rhonchi Extremities: no edema or cyanosis. Neuro: alert, no obvious focal deficits Psych: Normal affect and mood  ASSESSMENT/PLAN:   Type 2 diabetes mellitus without complications (HCC) A1c 10.8 earlier this morning. Nausea and vomiting with 1 mg Ozempic, better tolerance at 0.5 mg.  - Adjust Ozempic dose to 0.5 mg weekly as discussed with pharmacy team - Continue glipizide 10 mg daily and monitor for hypoglycemia. -Will consider adding SGLT2 when under better control  Essential hypertension, benign Uncontrolled and asymptomatic.  Did not take blood pressure medication today. -Future BMP ordered as lab is closed today -Continue Azor 5-20 mg daily -Return in 2 weeks for follow-up   Health maintenance: Cologuard and mammogram ordered.  Previously referred to ophthalmologist for eye exam. can discuss vaccines at next visit  Dr. Erick Alley, DO [facility]

## 2023-07-21 NOTE — Patient Instructions (Addendum)
 It was great to see you! Thank you for allowing me to participate in your care!  I recommend that you always bring your medications to each appointment as this makes it easy to ensure you are on the correct medications and helps [place] not miss when refills are needed.  Our plans for today:   -schedule apt with breast center for mammogram  - cologaurd will be mailed to your home  - make apt in 2 weeks for follow up with me  - check blood pressure daily and bring to next apt - check blood sugar fasting before breakfast daily and bring to next apt  -Contact for transportation needs: [facility] Guide, Phone: [phone]   We are checking some labs today, I will call you if they are abnormal will send you a MyChart message or a letter if they are normal.  If you do not hear about your labs in the next 2 weeks please let us know.  Take care and seek immediate care sooner if you develop any concerns.   Dr. Erick Alley, DO [facility]

## 2023-07-21 NOTE — Assessment & Plan Note (Signed)
 ASCVD risk - primary prevention in patient with diabetes. Last LDL is 129 AND not at goal of <70 mg/dL.  Higher intensity statin indicated.  Currently taking 20mg  daily - last fill was 2/17 for #100.  Consider dose increase at next interaction.

## 2023-07-21 NOTE — Assessment & Plan Note (Signed)
 A1c 10.8 earlier this morning. Nausea and vomiting with 1 mg Ozempic, better tolerance at 0.5 mg.  - Adjust Ozempic dose to 0.5 mg weekly as discussed with pharmacy team - Continue glipizide 10 mg daily and monitor for hypoglycemia. -Will consider adding SGLT2 when under better control

## 2023-07-21 NOTE — Research (Signed)
 S:     Chief Complaint  Patient presents with   Medication Management    Diabetes - Liberate CGM study   58 y.o. female who presents for diabetes evaluation, education, and management in the context of the LIBERATE Study.   Majority of visit was used to attempt Libre3 APP download which was unsuccessful using two different phone in patient's possession.  She states she has had the app downloaded in the past BUT it must be on her third phone which is at home.  She agreed to download app and attempt connection with sensor at home later today.   PMH is significant for Diabetes, Lupus, Tobacco Use, Hypertension and Hyperlipidemia.  Patient was referred and last seen by Primary Care Provider, Dr. Yetta Barre, on 06/30/2023.   At last visit, patient was found to have A1c of 12.   Today, patient arrives in good spirits and presents without any assistance.   Patient reports Diabetes was diagnosed in 2016.   Current diabetes medications include: Ozempic (semaglutide) with recently escalated dose from 0.5mg  to 1mg , Glipizide XL 10mg  daily Current hypertension medications include: amlodipine-olmesartan 5-20mg  daily Current hyperlipidemia medications include: atorvastatin 20mg   Patient reports adherence to taking all medications as prescribed.   Do you feel that your medications are working for you? yes Have you been experiencing any side effects to the medications prescribed? Yes, nausea and daily vomiting following increase dose of Ozempic to 1mg  last week.  Do you have any problems obtaining medications due to transportation or finances? no  Patient denies hypoglycemic events.  Sensor placed - pairing with phone after download of APP planned for later today.  O:   Review of Systems  Gastrointestinal:  Positive for nausea and vomiting.  All other systems reviewed and are negative.   Physical Exam Vitals reviewed.  Pulmonary:     Effort: Pulmonary effort is normal.  Neurological:      Mental Status: She is alert.  Psychiatric:        Mood and Affect: Mood normal.        Thought Content: Thought content normal.        Judgment: Judgment normal.    Lab Results  Component Value Date   HGBA1C 10.8 (A) 07/21/2023    Vitals:   07/21/23 1133 07/21/23 1134  BP: (!) 163/87 (!) 152/78  Pulse: 78   SpO2: 100%     Lipid Panel     Component Value Date/Time   CHOL 203 (H) 04/14/2023 1447   TRIG 150 (H) 04/14/2023 1447   HDL 47 04/14/2023 1447   CHOLHDL 4.3 04/14/2023 1447   CHOLHDL 3.4 06/24/2014 1149   VLDL 24 06/24/2014 1149   LDLCALC 129 (H) 04/14/2023 1447    Clinical Atherosclerotic Cardiovascular Disease (ASCVD):  The 10-year ASCVD risk score (Arnett DK, et al., 2019) is: 45.3%   Values used to calculate the score:     Age: 69 years     Sex: Female     Is Non-Hispanic African American: Yes     Diabetic: Yes     Tobacco smoker: Yes     Systolic Blood Pressure: 152 mmHg     Is BP treated: Yes     HDL Cholesterol: 47 mg/dL     Total Cholesterol: 203 mg/dL    A/P:  LIBERATE Study:  -Patient provided verbal consent to participate in the study. Consent documented in electronic medical record.  -Provided education on Libre 3 CGM. Collaborated to ensure [place] 3  app was downloaded on patient's phone. Educated on how to place sensor every 14 days, patient placed first sensor correctly and verbalized understanding of use, removal, and how to place next sensor. Discussed alarms. 1 sensor placed in office for connection later today.  Additional sensors to be provided once APP is function (next visit). Educated to contact the office if the sensor falls off early and replacements are needed before their next [employer].    Diabetes longstanding since 2016 currently with worsened control over recent past.  A1c today is 10.6  Patient is able to verbalize appropriate hypoglycemia management plan. GI distress with use of Ozempic (semaglutide) following recent dose  increase is problematic. - Educated on gradual dose escalation of   GLP-1 Ozempic (semaglutide) by 10 clicks per week using the 1mg  pen.  Dose for this week, today, is planned as 40 clicks (similar to 0.5mg  weekly), followed by 10 clicks per week if GI tolerability is tolerable w/o any vomiting.   -Patient educated on purpose, proper use, and potential adverse effects of GI distress.  -Extensively discussed pathophysiology of diabetes, recommended lifestyle interventions, dietary effects on blood sugar control.  -Counseled on s/sx of and management of hypoglycemia.   ASCVD risk - primary prevention in patient with diabetes. Last LDL is 129 AND not at goal of <70 mg/dL.  Higher intensity statin indicated.  Currently taking 20mg  daily - last fill was 2/17 for #100.  Consider dose increase at next interaction.   Tobacco use disorder - plan to discuss at future visit.   Written patient instructions provided. Patient verbalized understanding of treatment plan.  Total time in face to face counseling 37 minutes.    Follow-up:  Pharmacist 08/04/2023. PCP clinic visit in today at 2:00 PM.  Patient seen withChandler Mellody Dance, PharmD Candidate.

## 2023-07-21 NOTE — Assessment & Plan Note (Signed)
 Diabetes longstanding since 2016 currently with worsened control over recent past.  A1c today is 10.6  Patient is able to verbalize appropriate hypoglycemia management plan. GI distress with use of Ozempic (semaglutide) following recent dose increase is problematic. -Educated on gradual dose escalation of  GLP-1 Ozempic (semaglutide) by 10 clicks per week using the 1mg  pen.  Dose for this week, today, is planned as 40 clicks (similar to 0.5mg  weekly), followed by 10 clicks per week if GI tolerability is tolerable w/o any vomiting.   -Patient educated on purpose, proper use, and potential adverse effects of GI distress.  -Extensively discussed pathophysiology of diabetes, recommended lifestyle interventions, dietary effects on blood sugar control.  -Counseled on s/sx of and management of hypoglycemia.

## 2023-07-21 NOTE — Patient Instructions (Signed)
 It was nice to see you today!  Your goal blood sugar is 80-130 before eating and less than 180 after eating.  Medication Changes: Continue all other medication the same.   Monitor blood sugars at home and keep a log (glucometer or piece of paper) to bring with you to your next visit.  Keep up the good work with diet and exercise. Aim for a diet full of vegetables, fruit and lean meats (chicken, [place], fish). Try to limit salt intake by eating fresh or frozen vegetables (instead of canned), rinse canned vegetables prior to cooking and do not add any additional salt to meals.   Sensor Application If using the App, you can tap Help in the Main Menu to access an in-app tutorial on applying a Sensor. See below for instructions on how to download the app. Apply Sensors only on the back of your upper arm. If placed in other areas, the Sensor may not function properly and could give you inaccurate readings. Avoid areas with scars, moles, stretch marks, or lumps.   Select an area of skin that generally stays flat during your normal daily activities (no bending or folding). Choose a site that is at least 1 inch (2.5 cm) away from any injection sites. To prevent discomfort or skin irritation, you should select a different site other than the one most recently used. Wash application site using a plain soap, dry, and then clean with an alcohol wipe. This will help remove any oily residue that may prevent the sensor from sticking properly. Allow site to air dry before proceeding. Note: The area MUST be clean and dry, or the Sensor may not stay on for the full wear duration specified by your Sensor insert. 4. Unscrew the cap from the Sensor Applicator and set the cap aside.  5. Place the Sensor Applicator over the prepared site and push down firmly to apply the Sensor to your body. 6. Gently pull the Sensor Applicator away from your body. The Sensor should now be attached to your skin. 7. Make sure the Sensor  is secure after application. Put the cap back on the Sensor Applicator. Discard the used Engineer, agricultural according to local regulations.  What If My Sensor Falls Off or What If My Sensor Isn't Working? Call [employer] Customer Care Team at [phone] Available 7 days a week from 8AM-8PM EST, excluding holidays If yo have multiple sensors fall off prior to 14 days of use, contact [facility] at [phone]   The App Download the FreeStyle [place] 3 App in your phone's app store   Load the app and select get started now Create an account  Tap scan new sensor Follow the prompts on the screen. If your sensor does not sync, try moving your phone slowly around the sensor. Phone cases may affect scanning. This will be the only time you have to scan the sensor until you apply a new sensor in 14 days.  There will be a 60 minute start up period until the app will display your glucose reading   How To Share Your Readings With [place] Once in the app, go to settings -> connected apps -> LibreView -> Enter Practice ID -> [id number]

## 2023-07-21 NOTE — Assessment & Plan Note (Signed)
 Uncontrolled and asymptomatic.  Did not take blood pressure medication today. -Future BMP ordered as lab is closed today -Continue Azor 5-20 mg daily -Return in 2 weeks for follow-up

## 2023-07-24 ENCOUNTER — Other Ambulatory Visit: Payer: Self-pay

## 2023-07-24 ENCOUNTER — Other Ambulatory Visit

## 2023-07-24 DIAGNOSIS — E119 Type 2 diabetes mellitus without complications: Secondary | ICD-10-CM

## 2023-07-25 ENCOUNTER — Other Ambulatory Visit

## 2023-07-25 MED ORDER — ACCU-CHEK AVIVA PLUS VI STRP: ORAL_STRIP | 3 refills | Status: DC

## 2023-07-31 ENCOUNTER — Other Ambulatory Visit: Payer: Self-pay

## 2023-07-31 DIAGNOSIS — E119 Type 2 diabetes mellitus without complications: Secondary | ICD-10-CM

## 2023-08-01 MED ORDER — SEMAGLUTIDE (1 MG/DOSE) 4 MG/3ML ~~LOC~~ SOPN: 1.0000 mg | PEN_INJECTOR | SUBCUTANEOUS | 2 refills | Status: DC

## 2023-08-01 NOTE — Telephone Encounter (Signed)
 Attempted to contact patient for follow-up of GI tolerability of Ozempic (semaglutide) with dosing plan discussed at last pharmacist visit.   No answer - Left HIPAA compliant voice mail requesting call back to direct phone: [phone]  Refill completed as treatment plan is for continuation of dose escalation as tolerated up to 1mg  dose.   Total time with patient call and documentation of interaction: 9 minutes.  Follow-up visit planned: 08/04/2023

## 2023-08-01 NOTE — Telephone Encounter (Signed)
 Reviewed and agree with Dr Macky Lower plan.

## 2023-08-04 ENCOUNTER — Encounter: Payer: Self-pay | Admitting: Student

## 2023-08-04 ENCOUNTER — Ambulatory Visit: Admitting: Pharmacist

## 2023-08-04 ENCOUNTER — Ambulatory Visit: Admitting: Podiatry

## 2023-08-04 ENCOUNTER — Encounter: Payer: Self-pay | Admitting: Pharmacist

## 2023-08-04 ENCOUNTER — Ambulatory Visit (INDEPENDENT_AMBULATORY_CARE_PROVIDER_SITE_OTHER): Admitting: Student

## 2023-08-04 VITALS — BP 154/65 | HR 90

## 2023-08-04 DIAGNOSIS — I10 Essential (primary) hypertension: Secondary | ICD-10-CM

## 2023-08-04 DIAGNOSIS — E1169 Type 2 diabetes mellitus with other specified complication: Secondary | ICD-10-CM | POA: Diagnosis not present

## 2023-08-04 DIAGNOSIS — E119 Type 2 diabetes mellitus without complications: Secondary | ICD-10-CM

## 2023-08-04 DIAGNOSIS — M216X1 Other acquired deformities of right foot: Secondary | ICD-10-CM

## 2023-08-04 DIAGNOSIS — E785 Hyperlipidemia, unspecified: Secondary | ICD-10-CM | POA: Diagnosis not present

## 2023-08-04 NOTE — Patient Instructions (Signed)
 Sensor Application If using the App, you can tap Help in the Main Menu to access an in-app tutorial on applying a Sensor. See below for instructions on how to download the app. Apply Sensors only on the back of your upper arm. If placed in other areas, the Sensor may not function properly and could give you inaccurate readings. Avoid areas with scars, moles, stretch marks, or lumps.   Select an area of skin that generally stays flat during your normal daily activities (no bending or folding). Choose a site that is at least 1 inch (2.5 cm) away from any injection sites. To prevent discomfort or skin irritation, you should select a different site other than the one most recently used. Wash application site using a plain soap, dry, and then clean with an alcohol wipe. This will help remove any oily residue that may prevent the sensor from sticking properly. Allow site to air dry before proceeding. Note: The area MUST be clean and dry, or the Sensor may not stay on for the full wear duration specified by your Sensor insert. 4. Unscrew the cap from the Sensor Applicator and set the cap aside.  5. Place the Sensor Applicator over the prepared site and push down firmly to apply the Sensor to your body. 6. Gently pull the Sensor Applicator away from your body. The Sensor should now be attached to your skin. 7. Make sure the Sensor is secure after application. Put the cap back on the Sensor Applicator. Discard the used Engineer, agricultural according to local regulations.  What If My Sensor Falls Off or What If My Sensor Isn't Working? Call [employer] Customer Care Team at [phone] Available 7 days a week from 8AM-8PM EST, excluding holidays If yo have multiple sensors fall off prior to 14 days of use, contact [facility] at [phone]   The App Download the FreeStyle [place] 3 App in your phone's app store   Load the app and select get started now Create an account  Tap scan new  sensor Follow the prompts on the screen. If your sensor does not sync, try moving your phone slowly around the sensor. Phone cases may affect scanning. This will be the only time you have to scan the sensor until you apply a new sensor in 14 days.  There will be a 60 minute start up period until the app will display your glucose reading   How To Share Your Readings With [place] Once in the app, go to settings -> connected apps -> LibreView -> Enter Practice ID -> [id number]  It was nice to see you today!  Your goal blood sugar is 80-130 before eating and less than 180 after eating.  Medication Changes:  Increase Ozempic to 60 clicks next week.  Continue all other medication the same.   Keep up the good work with diet and exercise. Aim for a diet full of vegetables, fruit and lean meats (chicken, [place], fish). Try to limit salt intake by eating fresh or frozen vegetables (instead of canned), rinse canned vegetables prior to cooking and do not add any additional salt to meals.

## 2023-08-04 NOTE — Assessment & Plan Note (Signed)
 Diabetes longstanding currently uncontrolled with most recent A1c at 10.8%. Patient is able to verbalize appropriate hypoglycemia management plan. Medication adherence appears not adherent. Control is suboptimal due to medication adherence and diet. -Reapplied Libre 3 sensor to arm with instructions to set up app at home, her phone wasn't charged and therefor couldn't set up app in office. -Provided patient with replacement/working home Accu-chek glucometer -Increased dose of Ozempic to 60 clicks of the 1mg  pen (~ 0.75mg ) starting next week. -Continued glipizide 10 mg daily. -Patient educated on purpose, proper use, and potential adverse effects of Ozempic.  -Extensively discussed pathophysiology of diabetes, recommended lifestyle interventions, dietary effects on blood sugar control.  -Counseled on s/sx of and management of hypoglycemia.  -Review CGM results in 2 weeks.  Only two sensors supplied today as phone has not yet demonstrated functionality.

## 2023-08-04 NOTE — Patient Instructions (Signed)
 It was great to see you! Thank you for allowing me to participate in your care!  I recommend that you always bring your medications to each appointment as this makes it easy to ensure you are on the correct medications and helps [place] not miss when refills are needed.  Our plans for today:  - Continue current medications - bring medications to next appointment in 2 weeks and take your blood pressure medication before you come : )   We are checking some labs today, I will call you if they are abnormal will send you a MyChart message or a letter if they are normal.  If you do not hear about your labs in the next 2 weeks please let us know.  Take care and seek immediate care sooner if you develop any concerns.   Dr. Erick Alley, DO [facility]

## 2023-08-04 NOTE — Assessment & Plan Note (Signed)
 ASCVD risk - primary prevention in patient with diabetes. Last LDL is 129 not at goal of <40 mg/dL. ASCVD risk factors include HTN, HLD, and T2DM and 10-year ASCVD risk score of 46.7%, high intensity statin indicated.  -Continued atorvastatin 80 mg.  -Consider addition of Ezetimibe at next visit to help achieve lower LDL.

## 2023-08-04 NOTE — Progress Notes (Signed)
 S:     Chief Complaint  Patient presents with   Medication Management    Diabetes   58 y.o. female who presents for diabetes evaluation, education, and management. Patient arrives in good spirits and presents without any assistance.   Patient was referred and last seen by Primary Care Provider, Dr. Yetta Barre, on 07/21/2023 Also has PCP visit today with Dr. Yetta Barre.     PMH is significant for HTN, TDM, HLD, Lupus, tobacco use disorder.  At last visit, Ozempic dose was brought back down to 0.5 mg and slowly titrating dose up to 1 mg.   Current diabetes medications include: semaglutide (50 clicks weekly) , glipizide Current hypertension medications include: amlodipine-olmesartan Current hyperlipidemia medications include: atorvastatin  Patient reports adherence to taking all medications as prescribed.  Patient denies adherence with medications, reports missing all medications over the past two days (except ozempic), due to missing breakfast.   Insurance coverage: [place] Medicare  Patient denies hypoglycemic events.  Home meter has been broken so has not taken home readings. Libre 3 sensor fell off shortly after last appointment.   Patient denies nocturia (nighttime urination).  Patient denies neuropathy (nerve pain). Patient denies visual changes. Patient reports self foot exams.   O:   Libre3 CGM unable to be downloaded today due to running out of sensors.   Lab Results  Component Value Date   HGBA1C 10.8 (A) 07/21/2023   Vitals:   08/04/23 1114 08/04/23 1115  BP: (!) 149/74 (!) 154/65  Pulse: 90   SpO2: 100%     Lipid Panel     Component Value Date/Time   CHOL 203 (H) 04/14/2023 1447   TRIG 150 (H) 04/14/2023 1447   HDL 47 04/14/2023 1447   CHOLHDL 4.3 04/14/2023 1447   CHOLHDL 3.4 06/24/2014 1149   VLDL 24 06/24/2014 1149   LDLCALC 129 (H) 04/14/2023 1447    Clinical Atherosclerotic Cardiovascular Disease (ASCVD): No  The 10-year ASCVD risk score (Arnett  DK, et al., 2019) is: 46.7%   Values used to calculate the score:     Age: 62 years     Sex: Female     Is Non-Hispanic African American: Yes     Diabetic: Yes     Tobacco smoker: Yes     Systolic Blood Pressure: 154 mmHg     Is BP treated: Yes     HDL Cholesterol: 47 mg/dL     Total Cholesterol: 203 mg/dL   A/P: Diabetes longstanding currently uncontrolled with most recent A1c at 10.8%. Patient is able to verbalize appropriate hypoglycemia management plan. Medication adherence appears not adherent. Control is suboptimal due to medication adherence and diet. -Reapplied Libre 3 sensor to arm with instructions to set up app at home, her phone wasn't charged and therefor couldn't set up app in office. -Provided patient with replacement/working home Accu-chek glucometer -Increased dose of Ozempic to 60 clicks of the 1mg  pen (~ 0.75mg ) starting next week. -Continued glipizide 10 mg daily. -Patient educated on purpose, proper use, and potential adverse effects of Ozempic.  -Extensively discussed pathophysiology of diabetes, recommended lifestyle interventions, dietary effects on blood sugar control.  -Counseled on s/sx of and management of hypoglycemia.  -Review CGM results in 2 weeks.  Only two sensors supplied today as phone has not yet demonstrated functionality.  -Next A1c anticipated in three months.   ASCVD risk - primary prevention in patient with diabetes. Last LDL is 129 not at goal of <16 mg/dL. ASCVD risk factors include  HTN, HLD, and T2DM and 10-year ASCVD risk score of 46.7%, high intensity statin indicated.  -Continued atorvastatin 80 mg.  -Consider addition of Ezetimibe at next visit to help achieve lower LDL.   Hypertension longstanding currently uncontrolled. Blood pressure goal of <130/80 mmHg. Medication adherence is nonadherent. Blood pressure control is suboptimal due to missing medication doses. -Continued amlodipine-olmesartan 5/20 mg.  Written patient instructions  provided. Patient verbalized understanding of treatment plan.  Total time in face to face counseling 38 minutes.    Follow-up:  Pharmacist in two weeks 08/22/2023 PCP clinic visit on 09/11/2023 Patient seen with Threasa Heads, PharmD Candidate and Darolyn Rua, PharmD Candidate.

## 2023-08-04 NOTE — Progress Notes (Signed)
    SUBJECTIVE:   CHIEF COMPLAINT / HPI:   The patient, with a history of hypertension and diabetes, presents for a routine follow-up. She has mostly been compliant with her antihypertensive medication, but admits to missing doses yesterday and today due to a disrupted routine. She states she is feeling well today.   PERTINENT  PMH / PSH: T2DM, HTN  OBJECTIVE:   BP (!) 154/67   Pulse 78   Ht 5\' 2"  (1.575 m)   Wt 188 lb 9.6 oz (85.5 kg)   SpO2 100%   BMI 34.50 kg/m    General: NAD, pleasant, able to participate in exam Cardiac: RRR, no murmurs. Respiratory: CTAB, normal effort, No wheezes, rales or rhonchi Extremities: no edema of BLEs Skin: warm and dry Neuro: alert, no obvious focal deficits Psych: Normal affect and mood  ASSESSMENT/PLAN:   Essential hypertension, benign Blood pressure elevated at 154/67 mmHg due to missed medication.  She is asymptomatic. - Assess kidney function and electrolytes as they have not been checked since she started Azor. -Continue Azor 5-20 mg daily -Return in 2 weeks for blood pressure check, ensure she takes her blood pressure medicine prior to the visit     Dr. Erick Alley, DO [facility]

## 2023-08-04 NOTE — Assessment & Plan Note (Addendum)
 Blood pressure elevated at 154/67 mmHg due to missed medication.  She is asymptomatic. - Assess kidney function and electrolytes as they have not been checked since she started Azor. -Continue Azor 5-20 mg daily -Return in 2 weeks for blood pressure check, ensure she takes her blood pressure medicine prior to the visit

## 2023-08-04 NOTE — Progress Notes (Signed)
 Subjective:  Patient ID: [name], female    DOB: 12-10-1965,  MRN: [id number]  Chief Complaint  Patient presents with   Diabetes    Pt stated that she has a callus that is causing her some pain    58 y.o. female presents with the above complaint.  Patient presents with right submetatarsal 5 porokeratotic lesion painful to touch with underlying plantarflexed fifth metatarsal.  She states that it hurts with ambulation is with pressure she is a diabetic last A1c of 10% she would also like to inquire about diabetic shoes has not seen MRIs prior to seeing me for this denies any other acute issues   Review of Systems: Negative except as noted in the HPI. Denies N/V/F/Ch.  Past Medical History:  Diagnosis Date   Arthritis    Chronic back pain    DDD   Degenerative disk disease 02/27/2013   Diabetes mellitus without complication (HCC)    takes Metformin daily   Eye irritation 06/06/2023   Hyperlipidemia associated with type 2 diabetes mellitus (HCC) 04/15/2023   Hypertension    Insomnia    takes Melatonin nightly   Joint pain    Lupus    takes Plaquenil daily   Narrowing of intervertebral disc space 02/27/2013   Piriformis syndrome 06/02/2010   07/2011 MRI L-spine: 1. Prominent left lateral recess protrusion at L5-S1 encroaches on  the descending left S1 root.  2. Mild disc bulge and moderate facet degenerative disease L4-5  without central canal or foraminal narrowing.     Substance abuse (HCC)    Tachycardia 02/05/2016   Trichomoniasis 02/20/2018   Trigger thumb of left hand 02/18/2014    Current Outpatient Medications:    albuterol (VENTOLIN HFA) 108 (90 Base) MCG/ACT inhaler, Inhale 2 puffs into the lungs every 4 (four) hours as needed for wheezing or shortness of breath., Disp: 8.5 each, Rfl: 1   amLODipine-olmesartan (AZOR) 5-20 MG tablet, Take 1 tablet by mouth daily. (Patient not taking: Reported on 07/21/2023), Disp: 90 tablet, Rfl: 0   atorvastatin (LIPITOR) 20 MG tablet,  Take 1 tablet (20 mg total) by mouth daily., Disp: 100 tablet, Rfl: 2   doxycycline (VIBRA-TABS) 100 MG tablet, Take 1 tablet (100 mg total) by mouth 2 (two) times daily., Disp: 7 tablet, Rfl: 0   glipiZIDE (GLUCOTROL XL) 10 MG 24 hr tablet, Take 1 tablet (10 mg total) by mouth daily., Disp: 90 tablet, Rfl: 3   glucose blood (ACCU-CHEK AVIVA PLUS) test strip, Use to check blood sugar up to 10 times per day., Disp: 900 strip, Rfl: 3   hydroxychloroquine (PLAQUENIL) 200 MG tablet, Take 1 tablet (200 mg total) by mouth daily., Disp: 30 tablet, Rfl: 1   Lancet Devices (ONE TOUCH DELICA LANCING DEV) MISC, 1 each by Does not apply route daily. Use three lancets per day to test blood glucose levels with glucose meter, Disp: 1 each, Rfl: 4   Lancets (ONETOUCH DELICA PLUS LANCET33G) MISC, CHECK BLOOD SUGAR 3 TIMES DAILY, Disp: 300 each, Rfl: 2   Melatonin 5 MG TABS, Take 10 mg by mouth at bedtime., Disp: , Rfl:    Semaglutide, 1 MG/DOSE, 4 MG/3ML SOPN, Inject 1 mg into the skin once a week., Disp: 12 mL, Rfl: 2  Social History   Tobacco Use  Smoking Status Every Day   Current packs/day: 0.50   Average packs/day: 0.5 packs/day for 15.0 years (7.5 ttl pk-yrs)   Types: Cigarettes   Passive exposure: Current  Smokeless Tobacco  Never  Tobacco Comments   Smoking 3-5 cigs per day    Allergies  Allergen Reactions   Norco [Hydrocodone-Acetaminophen] Nausea And Vomiting   Penicillins Hives and Rash    Rash and hives as child, per pt's mother  Has patient had a PCN reaction causing immediate rash, facial/tongue/throat swelling, SOB or lightheadedness with hypotension: No Has patient had a PCN reaction causing severe rash involving mucus membranes or skin necrosis: No Has patient had a PCN reaction that required hospitalization No Has patient had a PCN reaction occurring within the last 10 years: No If all of the above answers are "NO", then may proceed with Cep   Objective:  There were no vitals  filed for this visit. There is no height or weight on file to calculate BMI. Constitutional Well developed. Well nourished.  Vascular Dorsalis pedis pulses palpable bilaterally. Posterior tibial pulses palpable bilaterally. Capillary refill normal to all digits.  No cyanosis or clubbing noted. Pedal hair growth normal.  Neurologic Normal speech. Oriented to person, place, and time. Epicritic sensation to light touch grossly present bilaterally.  Dermatologic Right submetatarsal 5 porokeratotic lesion with central nucleated granulated.  Pain on palpation.  Plantarflexed fifth metatarsal noted.  Hammertoe contractures noted bilaterally 2 through 5  Orthopedic: Normal joint ROM without pain or crepitus bilaterally. No visible deformities. No bony tenderness.   Radiographs: None Assessment:   1. Plantar flexed metatarsal bone of right foot   2. Type 2 diabetes mellitus without complication, unspecified whether long term insulin use (HCC)    Plan:  Patient was evaluated and treated and all questions answered.  Right plantarflexed fifth metatarsal - All questions and concerns were discussed with the patient in extensive detail given the amount of pain that she is having she would benefit from aggressive debridement of the lesion with visual debride down to healthy dry tissue no complication noted no bleeding noted.  Hammertoe contractures 2 through 5 bilaterally - All questions and concerns were discussed with the patient in extensive detail given that she is a diabetic with uncontrolled A1c in setting of hammertoe contractures patient will benefit from diabetic shoes she would be scheduled see Trish for diabetic shoes.  No follow-ups on file.

## 2023-08-05 LAB — BASIC METABOLIC PANEL WITH GFR
BUN/Creatinine Ratio: 11 (ref 9–23)
BUN: 9 mg/dL (ref 6–24)
CO2: 22 mmol/L (ref 20–29)
Calcium: 9.2 mg/dL (ref 8.7–10.2)
Chloride: 102 mmol/L (ref 96–106)
Creatinine, Ser: 0.8 mg/dL (ref 0.57–1.00)
Glucose: 119 mg/dL — ABNORMAL HIGH (ref 70–99)
Potassium: 4.1 mmol/L (ref 3.5–5.2)
Sodium: 140 mmol/L (ref 134–144)
eGFR: 85 mL/min/{1.73_m2} (ref >59)

## 2023-08-06 ENCOUNTER — Encounter: Payer: Self-pay | Admitting: Student

## 2023-08-07 NOTE — Progress Notes (Signed)
 Reviewed and agree with Dr Macky Lower plan.

## 2023-08-08 ENCOUNTER — Other Ambulatory Visit: Payer: Self-pay

## 2023-08-08 DIAGNOSIS — R052 Subacute cough: Secondary | ICD-10-CM

## 2023-08-08 DIAGNOSIS — E785 Hyperlipidemia, unspecified: Secondary | ICD-10-CM

## 2023-08-08 DIAGNOSIS — E119 Type 2 diabetes mellitus without complications: Secondary | ICD-10-CM

## 2023-08-08 DIAGNOSIS — I10 Essential (primary) hypertension: Secondary | ICD-10-CM

## 2023-08-08 MED ORDER — AMLODIPINE-OLMESARTAN 5-20 MG PO TABS: 1.0000 | ORAL_TABLET | Freq: Every day | ORAL | 0 refills | Status: DC

## 2023-08-08 MED ORDER — ALBUTEROL SULFATE HFA 108 (90 BASE) MCG/ACT IN AERS
2.0000 | INHALATION_SPRAY | RESPIRATORY_TRACT | 1 refills | Status: DC | PRN
Start: 2023-08-08 — End: 2024-02-13

## 2023-08-08 MED ORDER — GLIPIZIDE ER 10 MG PO TB24: 10.0000 mg | ORAL_TABLET | Freq: Every day | ORAL | 3 refills | Status: DC

## 2023-08-08 MED ORDER — ATORVASTATIN CALCIUM 20 MG PO TABS: 20.0000 mg | ORAL_TABLET | Freq: Every day | ORAL | 2 refills | Status: DC

## 2023-08-21 ENCOUNTER — Telehealth: Payer: Self-pay | Admitting: Pharmacist

## 2023-08-21 NOTE — Telephone Encounter (Signed)
 Reviewed and agree with Dr Macky Lower plan.

## 2023-08-21 NOTE — Telephone Encounter (Signed)
 Patient contacted for follow-up of request for call back voice mail - related to CGM  Since last contact patient reports she had had multiple glucose sensors that have come off early.   Requests additional sensors and reschedule of appointment that was scheduled for 4/29 Rx visit scheduled with me in 08/29/2023  Placed two sensors with overlay at front desk for pick-up later today.   Total time with patient call and documentation of interaction: 12 minutes.

## 2023-08-22 ENCOUNTER — Ambulatory Visit: Admitting: Pharmacist

## 2023-08-29 ENCOUNTER — Encounter: Payer: Self-pay | Admitting: Pharmacist

## 2023-08-29 ENCOUNTER — Ambulatory Visit: Admitting: Pharmacist

## 2023-08-29 VITALS — BP 136/65 | HR 77 | Wt 182.4 lb

## 2023-08-29 DIAGNOSIS — E119 Type 2 diabetes mellitus without complications: Secondary | ICD-10-CM | POA: Diagnosis not present

## 2023-08-29 NOTE — Progress Notes (Signed)
 S:     Chief Complaint  Patient presents with   Medication Management    Diabetes follow-up   58 y.o. female who presents for diabetes evaluation, education, and management. Patient arrives in good spirits and presents without any assistance. Patient was referred and last seen by Primary Care Provider, Dr. Rochelle Chu, on 08/04/2023.   PMH is significant for HTN, HLD. Diabetes was diagnosed in 2016.   Current diabetes medications include glipizide  10 mg once daily and Ozempic  (semaglutide ) up-titrating up to 1 mg weekly. Hypertension managed with amlodipine /olmesartan  5/20 mg once daily. Hyperlipidemia managed with atorvastatin  20 mg once daily. Patient reports adherence to taking all medications as prescribed with occasional lapses. Dependent on eating in the morning to remember to take medications.   Do you feel that your medications are working for you? yes Have you been experiencing any side effects to the medications prescribed? no  Patient denies hypoglycemic events, beyond one distant event where she took her glipizide  later in the day. Patient denies nocturia (nighttime urination). Patient reports neuropathy (nerve pain). Occasional, linked to high-sugar meals. Patient denies visual changes.Patient denies self foot exams. Saw podiatrist recently regarding a callous for which she is likely to receive surgery later in the year.   Patient eats 5-6 small meals/day. Breakfast consists of egg whites, fruit, bacon, and sometimes cheese.   O:   Review of Systems  Gastrointestinal:  Negative for nausea.  All other systems reviewed and are negative.   Physical Exam Constitutional:      Appearance: Normal appearance.  Neurological:     Mental Status: She is alert.  Psychiatric:        Mood and Affect: Mood normal.        Behavior: Behavior normal.        Thought Content: Thought content normal.        Judgment: Judgment normal.   Libre3 CGM Download Results from 4/10 - 4/23 % Time  CGM is active: 36% Average Glucose: 158 mg/dL Glucose Management Indicator: 7.1  Glucose Variability: 28.5% (goal <36%) Time in Goal:  - Time in range 70-180: 66% - Time above range: 33% (31% high, 2% very high)  - Time below range: 1%   Lab Results  Component Value Date   HGBA1C 10.8 (A) 07/21/2023   Vitals:   08/29/23 1514  BP: 136/65  Pulse: 77  SpO2: 100%    Lipid Panel     Component Value Date/Time   CHOL 203 (H) 04/14/2023 1447   TRIG 150 (H) 04/14/2023 1447   HDL 47 04/14/2023 1447   CHOLHDL 4.3 04/14/2023 1447   CHOLHDL 3.4 06/24/2014 1149   VLDL 24 06/24/2014 1149   LDLCALC 129 (H) 04/14/2023 1447    Clinical Atherosclerotic Cardiovascular Disease (ASCVD): No  The 10-year ASCVD risk score (Arnett DK, et al., 2019) is: 34.9%   Values used to calculate the score:     Age: 71 years     Sex: Female     Is Non-Hispanic African American: Yes     Diabetic: Yes     Tobacco smoker: Yes     Systolic Blood Pressure: 136 mmHg     Is BP treated: Yes     HDL Cholesterol: 47 mg/dL     Total Cholesterol: 203 mg/dL   A/P: Diabetes longstanding and currently improved control with gradual increasing of Ozempic  (semaglutide ) dose and is above '60 click' goal dose. Weight has improved to 182 lbs from over 200 lbs since  initiation. Patient reports trouble with getting FreeStyle sensors to stick, so have limited home glucose data. Current GMI of 7.1% and average BG of 158 mg/dL. No recent hypoglycemic events, and patient is able to verbalize appropriate hypoglycemia management plan. Medication adherence appears good.  -Continue to up-titrate Ozempic  with goal of achieving 1 mg once weekly while avoiding GI effects.  -Stop glipizide  due to improvement in weight and in glucose readings.   Hypertension - Though today's BP of 136/65 mmHg is uncontrolled, patient reports forgetting to take BP medication today. Based on this BP appears well controlled when patient is adherent.  Encouraged taking medications consistently.   Written patient instructions provided. Patient verbalized understanding of treatment plan. Total time in face to face counseling 29 minutes.    Follow-up:  Pharmacist on 10/17/23.  PCP clinic visit on 09/11/2023 Patient seen with Noah [place], PharmD Candidate.

## 2023-08-29 NOTE — Assessment & Plan Note (Signed)
 Diabetes longstanding and currently improved control with gradual increasing of Ozempic  (semaglutide ) dose and is above '60 click' goal dose. Weight has improved to 182 lbs from over 200 lbs since initiation. Patient reports trouble with getting FreeStyle sensors to stick, so have limited home glucose data. Current GMI of 7.1% and average BG of 158 mg/dL. No recent hypoglycemic events, and patient is able to verbalize appropriate hypoglycemia management plan. Medication adherence appears good.  -Continue to up-titrate Ozempic  with goal of achieving 1 mg once weekly while avoiding GI effects.  -Stop glipizide  due to improvement in weight and in glucose readings.

## 2023-08-29 NOTE — Patient Instructions (Signed)
 It was nice to see you today!  Your goal blood sugar is 80-130 before eating and less than 180 after eating.  Medication Changes: Stop taking glipizide .   Continue increasing your Ozempic  dose up to the full 1 mg dose once a week.   Continue using FreeStyle sensors every 14 days. Use the adhesive discussed in clinic to help them stay on.   Continue all other medication the same.  Keep up the good work with diet and exercise. Aim for a diet full of vegetables, fruit and lean meats (chicken, [place], fish). Try to limit salt intake by eating fresh or frozen vegetables (instead of canned), rinse canned vegetables prior to cooking and do not add any additional salt to meals.

## 2023-09-06 NOTE — Progress Notes (Signed)
 Reviewed and agree with Dr Macky Lower plan.

## 2023-09-11 ENCOUNTER — Ambulatory Visit: Payer: Medicare Other

## 2023-09-11 VITALS — Ht 62.0 in | Wt 182.0 lb

## 2023-09-11 DIAGNOSIS — Z0000 Encounter for general adult medical examination without abnormal findings: Secondary | ICD-10-CM | POA: Diagnosis not present

## 2023-09-11 NOTE — Progress Notes (Signed)
 Because this visit was a virtual/telehealth visit,  certain criteria was not obtained, such a blood pressure, CBG if applicable, and timed get up and go. Any medications not marked as "taking" were not mentioned during the medication reconciliation part of the visit. Any vitals not documented were not able to be obtained due to this being a telehealth visit or patient was unable to self-report a recent blood pressure reading due to a lack of equipment at home via telehealth. Vitals that have been documented are verbally provided by the patient.   Subjective:   [name] is a 58 y.o. who presents for a Medicare Wellness preventive visit.  As a reminder, Annual Wellness Visits don't include a physical exam, and some assessments may be limited, especially if this visit is performed virtually. We may recommend an in-person follow-up visit with your provider if needed.  Visit Complete: Virtual I connected with  [name] on 09/11/23 by a audio enabled telemedicine application and verified that I am speaking with the correct person using two identifiers.  Patient Location: Home  Provider Location: Office/Clinic  I discussed the limitations of evaluation and management by telemedicine. The patient expressed understanding and agreed to proceed.  Vital Signs: Because this visit was a virtual/telehealth visit, some criteria may be missing or patient reported. Any vitals not documented were not able to be obtained and vitals that have been documented are patient reported.  VideoDeclined- This patient declined Librarian, academic. Therefore the visit was completed with audio only.  Persons Participating in Visit: Patient.  AWV Questionnaire: No: Patient Medicare AWV questionnaire was not completed prior to this visit.  Cardiac Risk Factors include: advanced age (>73men, >19 women);family history of premature cardiovascular disease;diabetes  mellitus;dyslipidemia;obesity (BMI >30kg/m2);sedentary lifestyle;smoking/ tobacco exposure;hypertension     Objective:     Today's Vitals   09/11/23 1502  Weight: 182 lb (82.6 kg)  Height: 5\' 2"  (1.575 m)  PainSc: 3   PainLoc: Knee   Body mass index is 33.29 kg/m.     09/11/2023    3:17 PM 08/04/2023   10:30 AM 07/21/2023    1:35 PM 06/05/2023    1:29 PM 04/28/2023   11:19 AM 06/21/2022    1:53 PM 12/24/2020    4:03 PM  Advanced Directives  Does Patient Have a Medical Advance Directive? No No No No No No No  Would patient like information on creating a medical advance directive? No - Patient declined No - Patient declined No - Patient declined No - Patient declined No - Patient declined No - Patient declined No - Patient declined    Current Medications (verified) Outpatient Encounter Medications as of 09/11/2023  Medication Sig   albuterol  (VENTOLIN  HFA) 108 (90 Base) MCG/ACT inhaler Inhale 2 puffs into the lungs every 4 (four) hours as needed for wheezing or shortness of breath.   amLODipine -olmesartan  (AZOR ) 5-20 MG tablet Take 1 tablet by mouth daily.   atorvastatin  (LIPITOR) 20 MG tablet Take 1 tablet (20 mg total) by mouth daily.   glucose blood (ACCU-CHEK AVIVA PLUS) test strip Use to check blood sugar up to 10 times per day.   hydroxychloroquine  (PLAQUENIL ) 200 MG tablet Take 1 tablet (200 mg total) by mouth daily.   Lancet Devices (ONE TOUCH DELICA LANCING DEV) MISC 1 each by Does not apply route daily. Use three lancets per day to test blood glucose levels with glucose meter   Lancets (ONETOUCH DELICA PLUS LANCET33G) MISC CHECK BLOOD SUGAR  3 TIMES DAILY   Semaglutide , 1 MG/DOSE, 4 MG/3ML SOPN Inject 1 mg into the skin once a week.   No facility-administered encounter medications on file as of 09/11/2023.    Allergies (verified) Norco [hydrocodone -acetaminophen ] and Penicillins   History: Past Medical History:  Diagnosis Date   Arthritis    Chronic back pain    DDD    Degenerative disk disease 02/27/2013   Diabetes mellitus without complication (HCC)    takes Metformin  daily   Eye irritation 06/06/2023   Hyperlipidemia associated with type 2 diabetes mellitus (HCC) 04/15/2023   Hypertension    Insomnia    takes Melatonin nightly   Joint pain    Lupus    takes Plaquenil  daily   Narrowing of intervertebral disc space 02/27/2013   Piriformis syndrome 06/02/2010   07/2011 MRI L-spine: 1. Prominent left lateral recess protrusion at L5-S1 encroaches on  the descending left S1 root.  2. Mild disc bulge and moderate facet degenerative disease L4-5  without central canal or foraminal narrowing.     Substance abuse (HCC)    Tachycardia 02/05/2016   Trichomoniasis 02/20/2018   Trigger thumb of left hand 02/18/2014   Past Surgical History:  Procedure Laterality Date   APPENDECTOMY     SPINE SURGERY  03/10/2016   Spinal Fusion L5-S1   TONSILLECTOMY     Family History  Problem Relation Age of Onset   Hypertension Mother    Kidney disease Mother    COPD Mother    Heart disease Father    Bone cancer Father    Allergy (severe) Sister    Diabetes Paternal Grandmother    Social History   Socioeconomic History   Marital status: Single    Spouse name: Not on file   Number of children: Not on file   Years of education: Not on file   Highest education level: Some college, no degree  Occupational History   Occupation: food bank   Occupation: unemployed  Tobacco Use   Smoking status: Every Day    Current packs/day: 0.50    Average packs/day: 0.5 packs/day for 15.0 years (7.5 ttl pk-yrs)    Types: Cigarettes    Passive exposure: Current   Smokeless tobacco: Never   Tobacco comments:    Smoking 3-5 cigs per day  Vaping Use   Vaping status: Never Used  Substance and Sexual Activity   Alcohol use: Yes    Comment: occ   Drug use: No   Sexual activity: Yes    Birth control/protection: Post-menopausal  Other Topics Concern   Not on file  Social  History Narrative   Current Social History 12/29/2016           Patient lives with 2 roommates in one level home 12/29/2016   Transportation: Patient uses [employer] 12/29/2016   Important Relationships "Family" 12/29/2016    Pets: None 12/29/2016   Education / Work:  2 years college/ Disabled 12/29/2016   Interests / Fun: Read, fish, social media 12/29/2016   Current Stressors: "Money" 12/29/2016   L. Corinna Dickens, RN, BSN  Social Drivers of Health   Financial Resource Strain: Medium Risk (09/11/2023)   Overall Financial Resource Strain (CARDIA)    Difficulty of Paying Living Expenses: Somewhat hard  Food Insecurity: No Food Insecurity (09/11/2023)   Hunger Vital Sign    Worried About Running Out of Food in the Last Year: Never true    Ran Out of Food in the Last Year: Never true  Transportation Needs: Unmet Transportation Needs (09/11/2023)   PRAPARE - Administrator, Civil Service (Medical): Yes    Lack of Transportation (Non-Medical): Yes  Physical Activity: Insufficiently Active (09/11/2023)   Exercise Vital Sign    Days of Exercise per Week: 2 days    Minutes of Exercise per Session: 10 min  Stress: Stress Concern Present (09/11/2023)   [employer] of Occupational Health - Occupational Stress Questionnaire    Feeling of Stress : To some extent  Social Connections: Moderately Integrated (09/11/2023)   Social Connection and Isolation Panel [NHANES]    Frequency of Communication with Friends and Family: More than three times a week    Frequency of Social Gatherings with Friends and Family: Once a week    Attends Religious Services: More than 4 times per year    Active Member of [employer] or Organizations: Yes    Attends Engineer, structural: More than 4 times per year    Marital Status: Never married    Tobacco Counseling Ready to quit: Not Answered Counseling given: Not  Answered Tobacco comments: Smoking 3-5 cigs per day    Clinical Intake:  Pre-visit preparation completed: Yes  Pain : 0-10 Pain Score: 3  Pain Type: Chronic pain Pain Location: Knee Pain Orientation: Left, Right     BMI - recorded: 33.29 Nutritional Status: BMI > 30  Obese Nutritional Risks: None Diabetes: Yes CBG done?: No Did pt. bring in CBG monitor from home?: No  Lab Results  Component Value Date   HGBA1C 10.8 (A) 07/21/2023   HGBA1C 12.0 (A) 06/30/2023   HGBA1C 14.3 (A) 04/14/2023     How often do you need to have someone help you when you read instructions, pamphlets, or other written materials from your doctor or pharmacy?: 1 - Never  Interpreter Needed?: No  Information entered by :: Druscilla Gerhard, LPN.   Activities of Daily Living     09/11/2023    3:07 PM  In your present state of health, do you have any difficulty performing the following activities:  Hearing? 0  Vision? 0  Difficulty concentrating or making decisions? 0  Walking or climbing stairs? 0  Dressing or bathing? 0  Doing errands, shopping? 0  Preparing Food and eating ? N  Using the Toilet? N  In the past six months, have you accidently leaked urine? N  Do you have problems with loss of bowel control? N  Managing your Medications? N  Managing your Finances? N  Housekeeping or managing your Housekeeping? N    Patient Care Team: Glenn Lange, DO as PCP - General (Family Medicine) Devin Foerster, MD as Consulting Physician (Ophthalmology)  Indicate any recent Medical Services you may have received from other than [facility] providers in the past year (date may be approximate).     Assessment:    This is a routine wellness examination for [name].  Hearing/Vision screen Hearing Screening - Comments:: Denies hearing difficulties.  Vision Screening - Comments:: Patient not up to date with yearly eye exams with [facility].   Goals Addressed  This Visit's  Progress     Patient Stated (pt-stated)        09/11/23: My goal is to lose weight.  My ideal weight would be 170 pounds.       Depression Screen     09/11/2023    3:05 PM 08/04/2023   10:30 AM 06/30/2023    1:34 PM 06/05/2023    1:29 PM 04/28/2023   11:22 AM 04/14/2023    2:01 PM 12/23/2022    2:21 PM  PHQ 2/9 Scores  PHQ - 2 Score 2 0 1 0 0 0 0  PHQ- 9 Score 4 3 6 1 3 2 3     Fall Risk     09/11/2023    3:04 PM 12/23/2022    2:21 PM 12/06/2022    1:39 PM 08/29/2022    2:37 PM 12/24/2020    4:03 PM  Fall Risk   Falls in the past year? 0 0 0 0 0  Number falls in past yr: 0 0 0 0 0  Injury with Fall? 0  0 0 0  Risk for fall due to : No Fall Risks   No Fall Risks   Follow up Falls prevention discussed;Falls evaluation completed   Falls evaluation completed     MEDICARE RISK AT HOME:  Medicare Risk at Home Any stairs in or around the home?: Yes If so, are there any without handrails?: No Home free of loose throw rugs in walkways, pet beds, electrical cords, etc?: Yes Adequate lighting in your home to reduce risk of falls?: Yes Life alert?: No Use of a cane, walker or w/c?: No Grab bars in the bathroom?: Yes Shower chair or bench in shower?: No Elevated toilet seat or a handicapped toilet?: No  TIMED UP AND GO:  Was the test performed?  No  Cognitive Function: 6CIT completed    09/11/2023    3:06 PM 09/12/2018   11:01 AM  MMSE - Mini Mental State Exam  Not completed: Unable to complete Unable to complete        09/11/2023    3:16 PM 08/29/2022    2:41 PM 09/12/2018   10:59 AM  6CIT Screen  What Year? 0 points 0 points 0 points  What month? 0 points 0 points 0 points  What time? 0 points 0 points 0 points  Count back from 20 0 points 0 points 0 points  Months in reverse 0 points 0 points 0 points  Repeat phrase 0 points 0 points 2 points  Total Score 0 points 0 points 2 points    Immunizations Immunization History  Administered Date(s) Administered   Influenza  Whole 03/12/2009, 03/21/2013   Influenza, Seasonal, Injecte, Preservative Fre 02/18/2013   PPD Test 01/25/2019    Screening Tests Health Maintenance  Topic Date Due   COVID-19 Vaccine (1) Never done   DTaP/Tdap/Td (1 - Tdap) Never done   Pneumococcal Vaccine 104-46 Years old (1 of 2 - PCV) Never done   Zoster Vaccines- Shingrix (1 of 2) Never done   Fecal DNA (Cologuard)  Never done   MAMMOGRAM  Never done   OPHTHALMOLOGY EXAM  07/19/2017   INFLUENZA VACCINE  11/24/2023   HEMOGLOBIN A1C  01/21/2024   Diabetic kidney evaluation - Urine ACR  04/13/2024   FOOT EXAM  06/29/2024   Diabetic kidney evaluation - eGFR measurement  08/03/2024   Medicare Annual Wellness (AWV)  09/10/2024   Cervical Cancer Screening (HPV/Pap Cotest)  03/30/2025   Hepatitis C  Screening  Completed   HIV Screening  Completed   HPV VACCINES  Aged Out   Meningococcal B Vaccine  Aged Out    Health Maintenance  Health Maintenance Due  Topic Date Due   COVID-19 Vaccine (1) Never done   DTaP/Tdap/Td (1 - Tdap) Never done   Pneumococcal Vaccine 53-84 Years old (1 of 2 - PCV) Never done   Zoster Vaccines- Shingrix (1 of 2) Never done   Fecal DNA (Cologuard)  Never done   MAMMOGRAM  Never done   OPHTHALMOLOGY EXAM  07/19/2017   Health Maintenance Items Addressed: Yes Patient aware of current care gaps. Patient declined all vaccines.Patient is overdue for the following screenings: Mammogram, Diabetic Eye Exam and Cologuard.  Additional Screening:  Vision Screening: Recommended annual ophthalmology exams for early detection of glaucoma and other disorders of the eye.  Dental Screening: Recommended annual dental exams for proper oral hygiene  Community Resource Referral / Chronic Care Management: CRR required this visit?  No   CCM required this visit?  No   Plan:    I have personally reviewed and noted the following in the patient's chart:   Medical and social history Use of alcohol, tobacco or  illicit drugs  Current medications and supplements including opioid prescriptions. Patient is not currently taking opioid prescriptions. Functional ability and status Nutritional status Physical activity Advanced directives List of other physicians Hospitalizations, surgeries, and ER visits in previous 12 months Vitals Screenings to include cognitive, depression, and falls Referrals and appointments  In addition, I have reviewed and discussed with patient certain preventive protocols, quality metrics, and best practice recommendations. A written personalized care plan for preventive services as well as general preventive health recommendations were provided to patient.   Margette Sheldon, LPN   1/61/0960   After Visit Summary: (MyChart) Due to this being a telephonic visit, the after visit summary with patients personalized plan was offered to patient via MyChart   Notes: Patient aware of current care gaps. Patient declined all vaccines.Patient is overdue for the following screenings: Mammogram, Diabetic Eye Exam and Cologuard.

## 2023-09-11 NOTE — Patient Instructions (Signed)
 [name] , Thank you for taking time out of your busy schedule to complete your Annual Wellness Visit with me. I enjoyed our conversation and look forward to speaking with you again next year. I, as well as your care team,  appreciate your ongoing commitment to your health goals. Please review the following plan we discussed and let me know if I can assist you in the future. Your Game plan/ To Do List    Referrals: If you haven't heard from the office you've been referred to, please reach out to them at the phone provided.   Follow up Visits: Next Medicare AWV with our clinical staff: 09/19/2024 at 3:30p PHONE VISIT with Nurse   Have you seen your provider in the last 6 months (3 months if uncontrolled diabetes)? Yes Next Office Visit with your provider: 10/17/2023 at 4:00 pm with Dr. Alice Innocent OFFICE VISIT  Clinician Recommendations:  Aim for 30 minutes of exercise or brisk walking, 6-8 glasses of water, and 5 servings of fruits and vegetables each day.       This is a list of the screening recommended for you and due dates:  Health Maintenance  Topic Date Due   COVID-19 Vaccine (1) Never done   DTaP/Tdap/Td vaccine (1 - Tdap) Never done   Pneumococcal Vaccination (1 of 2 - PCV) Never done   Zoster (Shingles) Vaccine (1 of 2) Never done   Cologuard (Stool DNA test)  Never done   Mammogram  Never done   Eye exam for diabetics  07/19/2017   Flu Shot  11/24/2023   Hemoglobin A1C  01/21/2024   Yearly kidney health urinalysis for diabetes  04/13/2024   Complete foot exam   06/29/2024   Yearly kidney function blood test for diabetes  08/03/2024   Medicare Annual Wellness Visit  09/10/2024   Pap with HPV screening  03/30/2025   Hepatitis C Screening  Completed   HIV Screening  Completed   HPV Vaccine  Aged Out   Meningitis B Vaccine  Aged Out    Advanced directives: (Declined) Advance directive discussed with you today. Even though you declined this today, please call our office should you  change your mind, and we can give you the proper paperwork for you to fill out. Advance Care Planning is important because it:  [x]  Makes sure you receive the medical care that is consistent with your values, goals, and preferences  [x]  It provides guidance to your family and loved ones and reduces their decisional burden about whether or not they are making the right decisions based on your wishes.  Follow the link provided in your after visit summary or read over the paperwork we have mailed to you to help you started getting your Advance Directives in place. If you need assistance in completing these, please reach out to us  so that we can help you!  See attachments for Preventive Care and Fall Prevention Tips.

## 2023-09-29 ENCOUNTER — Ambulatory Visit

## 2023-09-29 DIAGNOSIS — M2142 Flat foot [pes planus] (acquired), left foot: Secondary | ICD-10-CM

## 2023-09-29 DIAGNOSIS — E119 Type 2 diabetes mellitus without complications: Secondary | ICD-10-CM

## 2023-09-29 DIAGNOSIS — L84 Corns and callosities: Secondary | ICD-10-CM

## 2023-10-03 ENCOUNTER — Encounter: Payer: Self-pay | Admitting: *Deleted

## 2023-10-09 NOTE — Progress Notes (Signed)
 Patient presents to the office today for diabetic shoe and insole measuring.  Patient was measured with brannock device to determine size and width for 1 pair of extra depth shoes and foam casted for 3 pair of insoles.   Documentation of medical necessity will be sent to patient's treating diabetic doctor to verify and sign.   Patient's diabetic provider: Glenn Lange DO  Shoes and insoles will be ordered at that time and patient will be notified for an appointment for fitting when they arrive.   Shoe size (per patient): 9WD Shoe choice:   [id number] / [id number] Shoe size ordered: 9WD  ABN signed

## 2023-10-10 ENCOUNTER — Encounter: Payer: Self-pay | Admitting: Student

## 2023-10-10 ENCOUNTER — Ambulatory Visit (INDEPENDENT_AMBULATORY_CARE_PROVIDER_SITE_OTHER): Admitting: Student

## 2023-10-10 VITALS — BP 146/72 | HR 88 | Ht 62.0 in | Wt 182.6 lb

## 2023-10-10 DIAGNOSIS — I10 Essential (primary) hypertension: Secondary | ICD-10-CM | POA: Diagnosis not present

## 2023-10-10 DIAGNOSIS — E119 Type 2 diabetes mellitus without complications: Secondary | ICD-10-CM | POA: Diagnosis not present

## 2023-10-10 LAB — POCT GLYCOSYLATED HEMOGLOBIN (HGB A1C): HbA1c, POC (controlled diabetic range): 7.6 % — AB (ref 0.0–7.0)

## 2023-10-10 MED ORDER — SEMAGLUTIDE (2 MG/DOSE) 8 MG/3ML ~~LOC~~ SOPN: 2.0000 mg | PEN_INJECTOR | SUBCUTANEOUS | 0 refills | Status: DC

## 2023-10-10 NOTE — Progress Notes (Signed)
    SUBJECTIVE:   CHIEF COMPLAINT / HPI:   [name] is a 58 year old female with diabetes and hypertension who presents for diabetes management and medication adjustment.   She experiences what she thinks are false low glucose readings from her CGM at night, leading to unnecessary sugar intake and subsequent high glucose levels. Therefore, she no longer wants to use the CGM.    She is on 1 mg of Ozempic  weekly.  She has tried 2 mg dosage in the past but was unable to tolerate it due to GI side effects including decreased appetite and nausea.  She states she is no longer having any side effects of the 1 mg dose and is doing very well.  She cannot tolerate metformin  due to severe gastrointestinal side effects including explosive diarrhea.   Her hypertension is managed with amlodipine -olmesartan  (Azor ) 5-20 mg, with home blood pressure readings around 120/80.  She is feeling well today.  PERTINENT  PMH / PSH: Obesity, T2DM, HTN, HLD  OBJECTIVE:   BP (!) 146/72   Pulse 88   Ht 5' 2 (1.575 m)   Wt 182 lb 9.6 oz (82.8 kg)   SpO2 99%   BMI 33.40 kg/m    General: NAD, pleasant, able to participate in exam Cardiac: RRR, no murmurs. Respiratory: CTAB, normal effort, No wheezes, rales or rhonchi Extremities: no edema or cyanosis. Skin: warm and dry Neuro: alert, no obvious focal deficits Psych: Normal affect and mood  ASSESSMENT/PLAN:   Type 2 diabetes mellitus without complications (HCC) Significantly improved over the last few months but still elevated at 7.5.  Patient cannot tolerate metformin  due to severe explosive diarrhea and GI upset. Has been doing well on 1 mg Ozempic  with previous GI side effects of nausea and severely decreased appetite completely resolved.  She prefers to trial going up to the 2 mg dosage. -Increase Ozempic  to 2 mg injected weekly - Patient to return in 3 months for next A1c -She has already been referred to ophthalmology, she is advised to  schedule an appointment for DM eye exam  Essential hypertension, benign Uncontrolled today and asymptomatic.  She did take her medications this morning, amlodipine -olmesartan  5-20 mg.  It was well-controlled at her previous visit with Dr. Koval and per patient has been well-controlled at home.  She prefers to stay on her current regimen and continue to monitor - Patient to monitor blood pressure at home and return sooner if consistently elevated, otherwise follow-up in office in 3 months with her next A1c check   Health maintenance Patient encouraged to schedule mammogram, complete Cologuard which is at her house, schedule appointment with ophthalmologist and she declines vaccines today On chart review, she was treated for trichomonas in 2022.  I do not see she has had a repeat wet prep since to test for reinfection.  Would consider this at next visit with new PCP if appropriate and patient desires  Dr. Glenn Lange, DO [facility]

## 2023-10-10 NOTE — Assessment & Plan Note (Signed)
 Significantly improved over the last few months but still elevated at 7.5.  Patient cannot tolerate metformin  due to severe explosive diarrhea and GI upset. Has been doing well on 1 mg Ozempic  with previous GI side effects of nausea and severely decreased appetite completely resolved.  She prefers to trial going up to the 2 mg dosage. -Increase Ozempic  to 2 mg injected weekly - Patient to return in 3 months for next A1c -She has already been referred to ophthalmology, she is advised to schedule an appointment for DM eye exam

## 2023-10-10 NOTE — Patient Instructions (Signed)
 It was great to see you! Thank you for allowing me to participate in your care!  I recommend that you always bring your medications to each appointment as this makes it easy to ensure you are on the correct medications and helps us  not miss when refills are needed.  Our plans for today:  - Increase dose of Ozempic  2 mg sent to pharmacy.  If you do not tolerate this well, you can go back down to the 1 mg dosage - Continue current medications, return in 3 months for next A1c check and to meet new PCP - Schedule mammogram, complete Cologuard testing, go see your ophthalmologist Take care and seek immediate care sooner if you develop any concerns.   Dr. Glenn Lange, DO [facility]

## 2023-10-10 NOTE — Assessment & Plan Note (Signed)
 Uncontrolled today and asymptomatic.  She did take her medications this morning, amlodipine -olmesartan  5-20 mg.  It was well-controlled at her previous visit with Dr. Koval and per patient has been well-controlled at home.  She prefers to stay on her current regimen and continue to monitor - Patient to monitor blood pressure at home and return sooner if consistently elevated, otherwise follow-up in office in 3 months with her next A1c check

## 2023-10-17 ENCOUNTER — Ambulatory Visit: Admitting: Pharmacist

## 2023-10-23 ENCOUNTER — Telehealth: Payer: Self-pay

## 2023-10-23 NOTE — Telephone Encounter (Signed)
 Patient LVM on nurse line requesting a note for work.  She reports Lupus complications which resulted in work missed.   I attempted to call patient back, however no answer or option for VM.   Will await her return call.

## 2023-10-24 ENCOUNTER — Telehealth: Payer: Self-pay | Admitting: Pharmacist

## 2023-10-24 NOTE — Telephone Encounter (Signed)
 Patient contacted for follow-up of missed appointment for Liberate study - Libre CGM  Review of CGM show good control recently.   Since last contact patient reports multiple nights with low readings overnight.  She denies low glucose symptoms with these alarms.  She has checked multiple times in the middle of the night and reports readings ~ 100.  Possible Compression Low.  We discussed turning OFF the <70 alarm due to lack of insulin  or sulfonylurea therapy at this time.   Current Medications include: Ozempic  (semaglutide ) 2 mg weekly.  Patient denies any significant medication related side effects.  Medication Plan: - No change  Patient is due for 3 month study visit with A1C - Asked patient to schedule appointment with me at her convenience.   Total time with patient call and documentation of interaction: 12 minutes.

## 2023-10-25 NOTE — Telephone Encounter (Signed)
 Reviewed and agree with Dr Macky Lower plan.

## 2023-10-29 ENCOUNTER — Other Ambulatory Visit: Payer: Self-pay | Admitting: Family Medicine

## 2023-10-29 DIAGNOSIS — E119 Type 2 diabetes mellitus without complications: Secondary | ICD-10-CM

## 2023-10-30 NOTE — Progress Notes (Signed)
 Docs only order sent to [facility] for new Overseeing Dr to sign off overseeing Dr. For resident Dr.

## 2023-10-31 ENCOUNTER — Telehealth: Payer: Self-pay | Admitting: Pharmacist

## 2023-10-31 NOTE — Telephone Encounter (Signed)
 Patient contacted for follow-up of 3 month CGM - Liberate study visit.  Patient tried to schedule follow-up but unfortunately, her time off did not match availability of clinic appts.  She verified that she is OUT of sensors.  She states she felt like her glucose  control was improved. (A1C with Dr. Joshua was 7.5)  Current Medications include: Ozempic  (semaglutide ) 2 mg Minus 20 clicks Patient reports that she continues to have some GI side effects from the above reduced dose from the 1mg .  We agreed that 2mg  minus 25 clicks may be a reasonable next dose to try.   Agreed to supply two Study Sensors for pick-up at the [facility] front desk to provide time for patient to schedule follow-up in the next 2-3 weeks.   Total time with patient call and documentation of interaction: 14 minutes.

## 2023-11-01 NOTE — Telephone Encounter (Signed)
 Reviewed and agree with Dr Macky Lower plan.

## 2023-11-07 ENCOUNTER — Ambulatory Visit (INDEPENDENT_AMBULATORY_CARE_PROVIDER_SITE_OTHER): Admitting: Pharmacist

## 2023-11-07 ENCOUNTER — Encounter: Payer: Self-pay | Admitting: Pharmacist

## 2023-11-07 VITALS — BP 154/74 | HR 75 | Wt 183.6 lb

## 2023-11-07 DIAGNOSIS — I10 Essential (primary) hypertension: Secondary | ICD-10-CM

## 2023-11-07 DIAGNOSIS — F17200 Nicotine dependence, unspecified, uncomplicated: Secondary | ICD-10-CM

## 2023-11-07 DIAGNOSIS — E119 Type 2 diabetes mellitus without complications: Secondary | ICD-10-CM

## 2023-11-07 LAB — POCT GLYCOSYLATED HEMOGLOBIN (HGB A1C): HbA1c, POC (controlled diabetic range): 8.5 % — AB (ref 0.0–7.0)

## 2023-11-07 NOTE — Assessment & Plan Note (Signed)
 Hypertension worsened control today likely related to non-adherence.  BP appears well controlled when patient is adherent. Encouraged taking medications consistently.  No change in therapy today.

## 2023-11-07 NOTE — Patient Instructions (Addendum)
 It was nice to see you today!  Thanks for your continuation in the [place].  Your A1C was 8.5 today.   Let me know if you have any problems with reconnecting to the practice.   Your goal blood sugar is 80-130 before eating and less than 180 after eating.  Medication Changes: Continue all medications that same.  Continue to work on your diet and exercise plan to improve your A1C at your next test in 3 months.   Keep up the good work with diet and exercise. Aim for a diet full of vegetables, fruit and lean meats (chicken, [place], fish). Try to limit salt intake by eating fresh or frozen vegetables (instead of canned), rinse canned vegetables prior to cooking and do not add any additional salt to meals.   Connected APPs LibreView [id number]

## 2023-11-07 NOTE — Progress Notes (Signed)
    S:     Chief Complaint  Patient presents with   Medication Management    Liberate - CGM study - 3 month visit   58 y.o. female who presents for diabetes evaluation, education, and management. Patient arrives in good spirits and presents without any assistance.   Patient was referred and last seen by Primary Care Provider, Dr. Joshua, on 10/10/2023.  At last visit, A1C was 7.6   PMH is significant for HTN, HLD. Diabetes was diagnosed in 2016.    Current diabetes medications include Ozempic  (semaglutide ) up-titrating up to ~1.5 mg weekly.  Hypertension managed with amlodipine /olmesartan  5/20 mg once daily.  Hyperlipidemia managed with atorvastatin  20 mg once daily. Patient reports non-adherence with Blood pressure medications for 2 fo the last 3 days. Dependent on work schedule, adherence variable. We discussed goal is to reduce risk of Heart Attack and Stroke 7/7 days per week which requires adherence daily.    Have you been experiencing any side effects to the medications prescribed? no   Patient denies hypoglycemic events, since discontinuation of glipizide .  Patient has not used CGM since early May due to lost phone.  New phone coming soon and patient is comfortable with restarting CGM use.   Tobacco use - 10-15 cigarettes per day.    O:   Review of Systems  All other systems reviewed and are negative.   Physical Exam Vitals reviewed.  Pulmonary:     Effort: Pulmonary effort is normal.  Neurological:     Mental Status: She is alert.  Psychiatric:        Mood and Affect: Mood normal.        Thought Content: Thought content normal.        Judgment: Judgment normal.     Libre3 CGM Download today 09/12/2023 Average Glucose: 133 mg/dL Glucose Management Indicator: 6.5    Lab Results  Component Value Date   HGBA1C 8.5 (A) 11/07/2023   Vitals:   11/07/23 1448 11/07/23 1451  BP: (!) 150/74 (!) 154/74  Pulse: 75   SpO2: 97%     Lipid Panel     Component  Value Date/Time   CHOL 203 (H) 04/14/2023 1447   TRIG 150 (H) 04/14/2023 1447   HDL 47 04/14/2023 1447   CHOLHDL 4.3 04/14/2023 1447   CHOLHDL 3.4 06/24/2014 1149   VLDL 24 06/24/2014 1149   LDLCALC 129 (H) 04/14/2023 1447    A/P: Diabetes longstanding and currently worsened control based on A1C worsening from 7.6 to 8.5 in the last month.  Patient continues with gradual increasing of Ozempic  (semaglutide ) dose. Weight remains improved at 183 lbs from from over 200 lbs. Denies recent hypoglycemic events, and patient is able to verbalize appropriate hypoglycemia management plan.  - Restart CGM - sensors provided. -Continue to up-titrate Ozempic  with goal of achieving 1 mg once weekly while avoiding GI effects.   Hypertension worsened control today likely related to non-adherence.  BP appears well controlled when patient is adherent. Encouraged taking medications consistently.  No change in therapy today.   Tobacco Use disorder - reluctant to plan for any level of intake reduction following brief risk discussion.  Continue to address and encourage intake reduction / cessation.   Written patient instructions provided. Patient verbalized understanding of treatment plan.  Total time in face to face counseling 28 minutes.    Follow-up:  Pharmacist 4 weeks 8/19 PCP clinic visit in TBD

## 2023-11-07 NOTE — Assessment & Plan Note (Signed)
  Tobacco Use disorder - reluctant to plan for any level of intake reduction following brief risk discussion.  Continue to address and encourage intake reduction / cessation.

## 2023-11-07 NOTE — Assessment & Plan Note (Signed)
 Diabetes longstanding and currently worsened control based on A1C worsening from 7.6 to 8.5 in the last month.  Patient continues with gradual increasing of Ozempic  (semaglutide ) dose. Weight remains improved at 183 lbs from from over 200 lbs. Denies recent hypoglycemic events, and patient is able to verbalize appropriate hypoglycemia management plan.  - Restart CGM - sensors provided. -Continue to up-titrate Ozempic  while avoiding GI effects.

## 2023-11-09 NOTE — Progress Notes (Signed)
 Reviewed and agree with Dr Macky Lower plan.

## 2023-11-14 ENCOUNTER — Telehealth: Payer: Self-pay | Admitting: Pharmacist

## 2023-11-14 MED ORDER — PEN NEEDLES 32G X 4 MM MISC: 1.0000 | 1 refills | Status: AC | PRN

## 2023-11-14 NOTE — Telephone Encounter (Signed)
 Patient contacts office to request assistance with access to additional pen needle tips for Ozempic  (semaglutide ).   Agreed to send new prescription for box of pen needle tips for use with pen device.  Patient thanked me for the assistance.   Total time with patient call and documentation of interaction: 9 minutes.

## 2023-11-14 NOTE — Telephone Encounter (Signed)
 Reviewed and agree with Dr Macky Lower plan.

## 2023-11-17 ENCOUNTER — Telehealth: Payer: Self-pay | Admitting: Podiatry

## 2023-11-17 NOTE — Telephone Encounter (Signed)
 Patient called to check on the status of her orthotics. She reports that no one has contacted her and expressed urgency due to ongoing foot pain.

## 2023-11-20 ENCOUNTER — Telehealth: Payer: Self-pay

## 2023-11-20 NOTE — Telephone Encounter (Signed)
 Faxed shoe paper work attn: Dr. Zheng and Dr McDiarmid Last attempt Patient can then come to get ppw and take to Dr. If she would like to move forward  Lolita Schultze Cped, CFo, CFm

## 2023-11-20 NOTE — Telephone Encounter (Signed)
 Patient calls nurse line to verify that we received paperwork from [facility] for orthotics.   This was received and placed in PCP box.   This paperwork requests Dr. McDiarmid's signature. As patient has Medicare, attending provider is likely required to sign this document.   Patient is requesting this ASAP due to her continued pain.   Forwarding to PCP and Dr. McDiarmid.   Chiquita JAYSON English, RN

## 2023-11-21 NOTE — Telephone Encounter (Signed)
 Form needs office visit notes documenting diabetes care in last 6 months.  Form placed back in Dr Hildy box.

## 2023-11-27 ENCOUNTER — Other Ambulatory Visit: Payer: Self-pay | Admitting: *Deleted

## 2023-11-27 DIAGNOSIS — I10 Essential (primary) hypertension: Secondary | ICD-10-CM

## 2023-11-27 NOTE — Progress Notes (Signed)
 Documents received 11/27/23 on DD website message sent asking if shoes will be sent soon  Lolita Schultze Cped, CFo, CFm

## 2023-11-28 MED ORDER — AMLODIPINE-OLMESARTAN 5-20 MG PO TABS: 1.0000 | ORAL_TABLET | Freq: Every day | ORAL | 3 refills | Status: DC

## 2023-12-01 ENCOUNTER — Telehealth: Payer: Self-pay | Admitting: Pharmacist

## 2023-12-01 DIAGNOSIS — E119 Type 2 diabetes mellitus without complications: Secondary | ICD-10-CM

## 2023-12-01 MED ORDER — SEMAGLUTIDE (2 MG/DOSE) 8 MG/3ML ~~LOC~~ SOPN: 2.0000 mg | PEN_INJECTOR | SUBCUTANEOUS | 2 refills | Status: DC

## 2023-12-01 NOTE — Telephone Encounter (Signed)
 Reviewed and agree with Dr Macky Lower plan.

## 2023-12-01 NOTE — Telephone Encounter (Signed)
 Patient contacts office to assistance with medication access - need for new prescription for Ozempic  (semaglutide ).   Returned call and discussed diabetes control.   Patient reports doing well with 2mg  dose MINUS 20 clicks (~ 1.5mg ) weekly.  She reports weight is stable She reports glucose is under good control.   New prescription provided. Follow-up visit planned on 8/19 with me in pharmacy clinic.   Total time with patient call and documentation of interaction: 11 minutes.

## 2023-12-12 ENCOUNTER — Encounter: Payer: Self-pay | Admitting: Pharmacist

## 2023-12-12 ENCOUNTER — Other Ambulatory Visit: Payer: Self-pay | Admitting: *Deleted

## 2023-12-12 ENCOUNTER — Ambulatory Visit (INDEPENDENT_AMBULATORY_CARE_PROVIDER_SITE_OTHER): Admitting: Pharmacist

## 2023-12-12 ENCOUNTER — Other Ambulatory Visit

## 2023-12-12 VITALS — BP 165/65 | HR 90 | Wt 190.4 lb

## 2023-12-12 DIAGNOSIS — E119 Type 2 diabetes mellitus without complications: Secondary | ICD-10-CM | POA: Diagnosis not present

## 2023-12-12 DIAGNOSIS — E785 Hyperlipidemia, unspecified: Secondary | ICD-10-CM

## 2023-12-12 DIAGNOSIS — E1169 Type 2 diabetes mellitus with other specified complication: Secondary | ICD-10-CM | POA: Diagnosis not present

## 2023-12-12 DIAGNOSIS — I10 Essential (primary) hypertension: Secondary | ICD-10-CM

## 2023-12-12 DIAGNOSIS — E1165 Type 2 diabetes mellitus with hyperglycemia: Secondary | ICD-10-CM

## 2023-12-12 MED ORDER — ONDANSETRON 4 MG PO TBDP: 4.0000 mg | ORAL_TABLET | Freq: Three times a day (TID) | ORAL | 0 refills | Status: AC | PRN

## 2023-12-12 MED ORDER — AMLODIPINE-OLMESARTAN 10-20 MG PO TABS: 1.0000 | ORAL_TABLET | Freq: Every day | ORAL | 2 refills | Status: DC

## 2023-12-12 MED ORDER — MOUNJARO 2.5 MG/0.5ML ~~LOC~~ SOAJ: 2.5000 mg | SUBCUTANEOUS | 2 refills | Status: DC

## 2023-12-12 MED ORDER — ATORVASTATIN CALCIUM 40 MG PO TABS: 40.0000 mg | ORAL_TABLET | Freq: Every day | ORAL | 3 refills | Status: DC

## 2023-12-12 NOTE — Assessment & Plan Note (Signed)
 ASCVD risk - primary prevention in patient with diabetes. Last LDL is 129 not at goal of <29 mg/dL.10-year ASCVD risk score of 47.8%. high intensity statin indicated.  -Increased dose of Atorvastatin  to 40 mg.

## 2023-12-12 NOTE — Assessment & Plan Note (Signed)
 Hypertension longstanding currently worsened with bp 165/65 in office today. Blood pressure goal of <130/80 mmHg. Medication adherence is inconsistent. Blood pressure control is suboptimal due to Ozempic  (semaglutide ) dose intolerance. -Increased dose of amlodipine /olmesartan  to 10-20 mg.

## 2023-12-12 NOTE — Patient Instructions (Signed)
 It was nice to see you today!  Your goal blood sugar is 80-130 before eating and less than 180 after eating.  Medication Changes: START Mounjaro  (tirzepatide ) 2.5 mg weekly  Start taking amlodipine /olmesartan  10-20 mg and discontinue amlodipine  5-20 mg  Start taking ondasetron 4 mg odt 2-3 times per day as needed for nausea and vomiting.  Increase atorvastatin  20 mg to 40 mg. Take 2 tablets of the 20 mg a day until out before you get the 40 mg  Discontinue Ozempic  (semaglutide ) 2 mg  Continue all other medication the same.  Keep up the good work with diet and exercise. Aim for a diet full of vegetables, fruit and lean meats (chicken, [place], fish). Try to limit salt intake by eating fresh or frozen vegetables (instead of canned), rinse canned vegetables prior to cooking and do not add any additional salt to meals.

## 2023-12-12 NOTE — Progress Notes (Signed)
 S:     Chief Complaint  Patient presents with   Medication Management    Dm f/u   58 y.o. female who presents for diabetes evaluation, education, and management. Patient arrives in good spirits and presents without any assistance.   Patient was referred and last seen by Primary Care Provider, Dr. Joshua, on 10/10/23.  At last visit, A1c was 8.5 and Ozempic  (semaglutide ) was up-titrated to ~2 mg weekly.   PMH is significant for hypertension, Hyperlipidemia, and Diabetes. Diabetes was diagnosed in 2016.   Reports Smoking is relatively consistent at 10-15 cigs/day but due to recent N/V will have less on those days and more on days that are GI effects are limited.  Current diabetes medications include: Ozempic  (semaglutide ) 2 mg weekly (Fri, Sat, or Sunday) Current hypertension medications include: amlodipine -olmesartan  5-20 mg Current hyperlipidemia medications include: Atorvastatin  20 mg   Patient denies adherence with medications, reports missing all medications due to GI intolerance from Ozempic  (semaglutide ) 2 mg dose. Missing all medications for 3 days a week due to not wanting to take the medications because she knows the medicine wouldn't stay down. Would feel sick for 1 day after 1.5 mg dose previously. Suggest 1 mg dose was fine but weight and sugars plateau at that dose.  Reports feeling that diabetes is controlling her.  Have you been experiencing any side effects to the medications prescribed? yes Do you have any problems obtaining medications due to transportation or finances? no  Patient denies hypoglycemic events.  Reported sugars around 200-240 during the day and high 100s overnight.  Patient denies any dizziness. Patient reports nocturia (nighttime urination).  Patient denies neuropathy (nerve pain). Patient denies visual changes. Patient reports self foot exams. (Callus on foot has begun to hurt and hurts worse when having more sweets)- hasn't been seeing any  leakage but hurts more recently. Podiatry wanting blood sugar more controlled before continuing to treat.  Patient reported dietary habits: Reports craving sweets. Reports a binge-fast schedule. Small meals several times per day like usual but increased amount of sugar intake.  Reports sensors being pulled off when taking off adhesive cover to [place].  O:   Review of Systems  All other systems reviewed and are negative.   Physical Exam Constitutional:      Appearance: Normal appearance.  Pulmonary:     Effort: Pulmonary effort is normal.  Neurological:     Mental Status: She is alert.  Psychiatric:        Mood and Affect: Mood normal.        Behavior: Behavior normal.        Thought Content: Thought content normal.        Judgment: Judgment normal.    Lab Results  Component Value Date   HGBA1C 8.5 (A) 11/07/2023   Vitals:   12/12/23 1346 12/12/23 1409  BP: (!) 168/61 (!) 165/65  Pulse: 90   SpO2: 100%     Lipid Panel     Component Value Date/Time   CHOL 203 (H) 04/14/2023 1447   TRIG 150 (H) 04/14/2023 1447   HDL 47 04/14/2023 1447   CHOLHDL 4.3 04/14/2023 1447   CHOLHDL 3.4 06/24/2014 1149   VLDL 24 06/24/2014 1149   LDLCALC 129 (H) 04/14/2023 1447    Clinical Atherosclerotic Cardiovascular Disease (ASCVD): No The 10-year ASCVD risk score (Arnett DK, et al., 2019) is: 54.9%   Values used to calculate the score:     Age: 49 years  Clincally relevant sex: Female     Is Non-Hispanic African American: Yes     Diabetic: Yes     Tobacco smoker: Yes     Systolic Blood Pressure: 165 mmHg     Is BP treated: Yes     HDL Cholesterol: 47 mg/dL     Total Cholesterol: 203 mg/dL   A/P: Diabetes longstanding and control is currently worsening as medications have intolerance and diet changes. Patient is able to verbalize appropriate hypoglycemia management plan. Medication adherence appears inconsistent. Control is suboptimal due to Ozempic  (semaglutide ) dose  intolerance . -Started Mounjaro  (tirzepatide ) from 2.5 mg weekly  -Called Pharmacy to make sure 56-month supply is filled as patient requested. -Discontinued Ozempic  (semaglutide ) 2 mg due to severe GI intolerance -May initiate SGLT2-I at next visit if tolerable to today's changes -Patient educated on purpose, proper use, and potential adverse effects.  -Extensively discussed pathophysiology of diabetes, recommended lifestyle interventions, dietary effects on blood sugar control.  -Counseled on s/sx of and management of hypoglycemia.  -Remind to bring phone to next visit to get CGM data.  ASCVD risk - primary prevention in patient with diabetes. Last LDL is 129 not at goal of <29 mg/dL.10-year ASCVD risk score of 47.8%. high intensity statin indicated.  -Increased dose of Atorvastatin  to 40 mg.   Hypertension longstanding currently worsened with bp 165/65 in office today. Blood pressure goal of <130/80 mmHg. Medication adherence is inconsistent. Blood pressure control is suboptimal due to Ozempic  (semaglutide ) dose intolerance. -Increased dose of amlodipine /olmesartan  to 10-20 mg.  Written patient instructions provided. Patient verbalized understanding of treatment plan.  Total time in face to face counseling 32 minutes.    Follow-up:  Pharmacist 01/11/24 PCP clinic visit in 01/12/24 Patient seen with Fonda Blase, PharmD Candidate - PY3 student and Calton Nash, PharmD Candidate - PY4 student.

## 2023-12-12 NOTE — Assessment & Plan Note (Addendum)
 Diabetes longstanding and control is currently worsening as medications have intolerance and diet changes. Patient is able to verbalize appropriate hypoglycemia management plan. Medication adherence appears inconsistent. Control is suboptimal due to Ozempic  (semaglutide ) dose intolerance . -Started Mounjaro  (tirzepatide ) from 2.5 mg weekly -Called Pharmacy to make sure 38-month supply is filled as patient requested. -Discontinued Ozempic  (semaglutide ) 2 mg due to severe GI intolerance -May initiate SGLT2-I at next visit if tolerable to today's changes -Patient educated on purpose, proper use, and potential adverse effects.  -Extensively discussed pathophysiology of diabetes, recommended lifestyle interventions, dietary effects on blood sugar control.  -Counseled on s/sx of and management of hypoglycemia.  -Remind to bring phone to next visit to get CGM data.

## 2023-12-13 ENCOUNTER — Other Ambulatory Visit (HOSPITAL_COMMUNITY): Payer: Self-pay

## 2023-12-13 MED ORDER — ONETOUCH DELICA PLUS LANCET33G MISC: 1.0000 | Freq: Three times a day (TID) | 3 refills | Status: DC | PRN

## 2023-12-13 NOTE — Progress Notes (Signed)
 Reviewed and agree with Dr Rennis plan.

## 2023-12-18 ENCOUNTER — Ambulatory Visit (INDEPENDENT_AMBULATORY_CARE_PROVIDER_SITE_OTHER)

## 2023-12-18 DIAGNOSIS — E119 Type 2 diabetes mellitus without complications: Secondary | ICD-10-CM | POA: Diagnosis not present

## 2023-12-18 DIAGNOSIS — M2142 Flat foot [pes planus] (acquired), left foot: Secondary | ICD-10-CM

## 2023-12-18 DIAGNOSIS — M2141 Flat foot [pes planus] (acquired), right foot: Secondary | ICD-10-CM | POA: Diagnosis not present

## 2023-12-18 DIAGNOSIS — L84 Corns and callosities: Secondary | ICD-10-CM

## 2023-12-18 NOTE — Progress Notes (Signed)
 Patient presents today to pick up diabetic shoes and insoles.  Patient was dispensed 1 pair of diabetic shoes and 3 pairs of total contact diabetic insoles. Fit was satisfactory. Instructions for break-in and wear was reviewed and a copy was given to the patient.   Re-appointment for regularly scheduled diabetic foot care visits or if they should experience any trouble with the shoes or insoles.

## 2023-12-20 ENCOUNTER — Ambulatory Visit (INDEPENDENT_AMBULATORY_CARE_PROVIDER_SITE_OTHER): Admitting: Podiatry

## 2023-12-20 DIAGNOSIS — M216X1 Other acquired deformities of right foot: Secondary | ICD-10-CM | POA: Diagnosis not present

## 2023-12-20 DIAGNOSIS — E119 Type 2 diabetes mellitus without complications: Secondary | ICD-10-CM

## 2023-12-20 NOTE — Progress Notes (Signed)
 Subjective:  Patient ID: [name], female    DOB: 09-11-1965,  MRN: [id number]  Chief Complaint  Patient presents with   Plantar flexed metatarsal bone of right foot    Pt stated that the place on her foot has came back and she would like to have it shaved down again     58 y.o. female presents with the above complaint.  Patient presents with right submetatarsal 5 porokeratotic lesion painful to touch with underlying plantarflexed fifth metatarsal.  She states that it hurts with ambulation is with pressure she is a diabetic last A1c of 10% she would also like to inquire about diabetic shoes has not seen MRIs prior to seeing me for this denies any other acute issues   Review of Systems: Negative except as noted in the HPI. Denies N/V/F/Ch.  Past Medical History:  Diagnosis Date   Arthritis    Chronic back pain    DDD   Degenerative disk disease 02/27/2013   Diabetes mellitus without complication (HCC)    takes Metformin  daily   Eye irritation 06/06/2023   Hyperlipidemia associated with type 2 diabetes mellitus (HCC) 04/15/2023   Hypertension    Insomnia    takes Melatonin nightly   Joint pain    Lupus    takes Plaquenil  daily   Narrowing of intervertebral disc space 02/27/2013   Piriformis syndrome 06/02/2010   07/2011 MRI L-spine: 1. Prominent left lateral recess protrusion at L5-S1 encroaches on  the descending left S1 root.  2. Mild disc bulge and moderate facet degenerative disease L4-5  without central canal or foraminal narrowing.     Substance abuse (HCC)    Tachycardia 02/05/2016   Trichomoniasis 02/20/2018   Trigger thumb of left hand 02/18/2014    Current Outpatient Medications:    OZEMPIC , 1 MG/DOSE, 4 MG/3ML SOPN, Inject 1 mg into the skin once a week., Disp: , Rfl:    OZEMPIC , 2 MG/DOSE, 8 MG/3ML SOPN, Inject 2 mg into the skin once a week., Disp: , Rfl:    albuterol  (VENTOLIN  HFA) 108 (90 Base) MCG/ACT inhaler, Inhale 2 puffs into the lungs every 4 (four) hours  as needed for wheezing or shortness of breath., Disp: 8.5 each, Rfl: 1   amlodipine -olmesartan  (AZOR ) 10-20 MG tablet, Take 1 tablet by mouth daily., Disp: 90 tablet, Rfl: 2   atorvastatin  (LIPITOR) 40 MG tablet, Take 1 tablet (40 mg total) by mouth daily., Disp: 90 tablet, Rfl: 3   glucose blood (ACCU-CHEK AVIVA PLUS) test strip, USE TO CHECK BLOOD SUGAR UP TO 10 TIMES PER DAY., Disp: 900 strip, Rfl: 5   hydroxychloroquine  (PLAQUENIL ) 200 MG tablet, Take 1 tablet (200 mg total) by mouth daily., Disp: 30 tablet, Rfl: 1   Insulin  Pen Needle (PEN NEEDLES) 32G X 4 MM MISC, 1 Container by Does not apply route as needed., Disp: 100 each, Rfl: 1   Lancet Devices (ONE TOUCH DELICA LANCING DEV) MISC, 1 each by Does not apply route daily. Use three lancets per day to test blood glucose levels with glucose meter, Disp: 1 each, Rfl: 4   Lancets (ONETOUCH DELICA PLUS LANCET33G) MISC, Apply 1 Application topically 3 (three) times daily as needed (for blood sugar checks)., Disp: 300 each, Rfl: 3   ondansetron  (ZOFRAN -ODT) 4 MG disintegrating tablet, Take 1 tablet (4 mg total) by mouth every 8 (eight) hours as needed for nausea or vomiting., Disp: 10 tablet, Rfl: 0   tirzepatide  (MOUNJARO ) 2.5 MG/0.5ML Pen, Inject 2.5 mg into the  skin once a week., Disp: 6 mL, Rfl: 2  Social History   Tobacco Use  Smoking Status Every Day   Current packs/day: 0.50   Average packs/day: 0.5 packs/day for 15.0 years (7.5 ttl pk-yrs)   Types: Cigarettes   Passive exposure: Current  Smokeless Tobacco Never  Tobacco Comments   Smoking 10-15 cigs per day  10/2023    Allergies  Allergen Reactions   Norco [Hydrocodone -Acetaminophen ] Nausea And Vomiting   Penicillins Hives and Rash    Rash and hives as child, per pt's mother  Has patient had a PCN reaction causing immediate rash, facial/tongue/throat swelling, SOB or lightheadedness with hypotension: No Has patient had a PCN reaction causing severe rash involving mucus  membranes or skin necrosis: No Has patient had a PCN reaction that required hospitalization No Has patient had a PCN reaction occurring within the last 10 years: No If all of the above answers are NO, then may proceed with Cep   Objective:  There were no vitals filed for this visit. There is no height or weight on file to calculate BMI. Constitutional Well developed. Well nourished.  Vascular Dorsalis pedis pulses palpable bilaterally. Posterior tibial pulses palpable bilaterally. Capillary refill normal to all digits.  No cyanosis or clubbing noted. Pedal hair growth normal.  Neurologic Normal speech. Oriented to person, place, and time. Epicritic sensation to light touch grossly present bilaterally.  Dermatologic Right submetatarsal 5 porokeratotic lesion with central nucleated granulated.  Pain on palpation.  Plantarflexed fifth metatarsal noted.  Hammertoe contractures noted bilaterally 2 through 5  Orthopedic: Normal joint ROM without pain or crepitus bilaterally. No visible deformities. No bony tenderness.   Radiographs: None Assessment:   1. Type 2 diabetes mellitus without complication, unspecified whether long term insulin  use (HCC)   2. Plantar flexed metatarsal bone of right foot     Plan:  Patient was evaluated and treated and all questions answered.  Right plantarflexed fifth metatarsal - All questions and concerns were discussed with the patient in extensive detail given the amount of pain that she is having she would benefit from aggressive debridement of the lesion with visual debride down to healthy dry tissue no complication noted no bleeding noted.  Hammertoe contractures 2 through 5 bilaterally - All questions and concerns were discussed with the patient in extensive detail given that she is a diabetic with uncontrolled A1c in setting of hammertoe contractures patient will benefit from diabetic shoes she would be scheduled see Trish for diabetic shoes.  No  follow-ups on file.

## 2024-01-08 ENCOUNTER — Other Ambulatory Visit

## 2024-01-11 ENCOUNTER — Ambulatory Visit: Admitting: Pharmacist

## 2024-01-12 ENCOUNTER — Ambulatory Visit: Admitting: Family Medicine

## 2024-01-12 NOTE — Progress Notes (Deleted)
    SUBJECTIVE:   CHIEF COMPLAINT / HPI:   ***     T2DM - Was switched to mounjaro  from ozempic  for GI side effects     HTN - Previously increased amlo-olmesartan      HLD   - due for colonoscopy - due for mammorgram  PERTINENT  PMH / PSH: ***  OBJECTIVE:   There were no vitals taken for this visit.  ***  ASSESSMENT/PLAN:   Assessment & Plan      Kimberly Nearing, MD [facility]

## 2024-01-25 ENCOUNTER — Telehealth: Payer: Self-pay | Admitting: Pharmacist

## 2024-01-25 NOTE — Telephone Encounter (Signed)
 Patient contacted for follow-up of missed appointment for Liberate - 6 month end of study appointment.   Patient reports phone issues and sensors not sticking.  She also reports she has NOT been doing what she should be doing to control her glucose.   She agreed to reschedule and we agreed on an appointment 10/7 at 1:30  Total time with patient call and documentation of interaction: 8 minutes.

## 2024-01-25 NOTE — Telephone Encounter (Signed)
duplicate opened in error

## 2024-01-26 NOTE — Progress Notes (Signed)
 [name]                                          MRN: [id number]   01/26/2024   The [facility] Quality Team Specialist reviewed this patient medical record for the purposes of chart review for care gap closure. The following were reviewed: chart review for care gap closure-diabetic eye exam.    [facility] Quality Team

## 2024-01-29 NOTE — Telephone Encounter (Signed)
 Reviewed and agree with Dr Rennis plan.

## 2024-01-30 ENCOUNTER — Ambulatory Visit (INDEPENDENT_AMBULATORY_CARE_PROVIDER_SITE_OTHER): Admitting: Pharmacist

## 2024-01-30 VITALS — BP 147/78 | HR 88 | Wt 202.8 lb

## 2024-01-30 DIAGNOSIS — E119 Type 2 diabetes mellitus without complications: Secondary | ICD-10-CM

## 2024-01-30 LAB — POCT GLYCOSYLATED HEMOGLOBIN (HGB A1C): HbA1c, POC (controlled diabetic range): 11.3 % — AB (ref 0.0–7.0)

## 2024-01-30 NOTE — Patient Instructions (Addendum)
 It was nice to see you today! Your goal blood sugar is 80-130 before eating and less than 180 after eating.  Medication Changes: Restart Mounjaro  (tirzepatide ) 2.5 mg weekly Continue all other medication the same.   Monitor blood sugars at home and keep a log (glucometer or piece of paper) to bring with you to your next visit.  Keep up the good work with diet and exercise. Aim for a diet full of vegetables, fruit and lean meats (chicken, [place], fish). Try to limit salt intake by eating fresh or frozen vegetables (instead of canned), rinse canned vegetables prior to cooking and do not add any additional salt to meals.

## 2024-01-30 NOTE — Progress Notes (Signed)
 S:     Chief Complaint  Patient presents with   Medication Management    Diabetes management and LIBERATE end of study visit   58 y.o. female who presents for diabetes evaluation, education, and management. Patient arrives in decent spirits and presents without any assistance.  Patient was referred and last seen by Primary Care Provider, Dr. Bettymae Yott, on 12/12/23.  At last visit, pt was switched from Semaglutide  to Tirzepatide  due to GI intolerance.   PMH is significant for diabetes, hypertension, hyperlipidemia, and lupus.  Patient reports Diabetes was diagnosed in 2010-2015.   Current diabetes medications include: Mounjaro  (tirzepatide ) 2.5mg  subcutaneously once weekly (has not been adherent since late August) Current hypertension medications include: amlodipine -olmesartan  10-20mg  Current hyperlipidemia medications include: atorvastatin  40mg   Patient denies adherence with medications, reports missing her tirzepatide  for the last 4 weeks. Reports feeling overwhelmed with diabetes care. Reports good adherence to her other medications.   Do you feel that your medications are working for you? Yes, when she takes it Have you been experiencing any side effects to the medications prescribed? no Do you have any problems obtaining medications due to transportation or finances? no Insurance coverage: [facility] Medicare  Patient reports nocturia (nighttime urination).  Patient denies neuropathy (nerve pain). Patient denies visual changes.  Patient reports not adhering to a diabetic diet recently, and has been eating a high amount of carbs.   Patient-reported exercise habits: walks around a mile each day  A1c today: 11.3%, after previously being 8.5% in August 2025   O:  Review of Systems  All other systems reviewed and are negative.   Physical Exam Vitals reviewed.  Constitutional:      Appearance: Normal appearance.  Pulmonary:     Effort: Pulmonary effort is normal.   Neurological:     Mental Status: She is alert.  Psychiatric:        Mood and Affect: Mood normal.        Behavior: Behavior normal.        Thought Content: Thought content normal.        Judgment: Judgment normal.   Observed patterns:  Lab Results  Component Value Date   HGBA1C 11.3 (A) 01/30/2024   Vitals:   01/30/24 1342 01/30/24 1352  BP: (!) 159/81 (!) 147/78  Pulse: 88   SpO2: 100%     Lipid Panel     Component Value Date/Time   CHOL 203 (H) 04/14/2023 1447   TRIG 150 (H) 04/14/2023 1447   HDL 47 04/14/2023 1447   CHOLHDL 4.3 04/14/2023 1447   CHOLHDL 3.4 06/24/2014 1149   VLDL 24 06/24/2014 1149   LDLCALC 129 (H) 04/14/2023 1447    Clinical Atherosclerotic Cardiovascular Disease (ASCVD): No  The 10-year ASCVD risk score (Arnett DK, et al., 2019) is: 43.2%   Values used to calculate the score:     Age: 75 years     Clincally relevant sex: Female     Is Non-Hispanic African American: Yes     Diabetic: Yes     Tobacco smoker: Yes     Systolic Blood Pressure: 147 mmHg     Is BP treated: Yes     HDL Cholesterol: 47 mg/dL     Total Cholesterol: 203 mg/dL   Patient is participating in a Managed Medicaid Plan: No   A/P: Diabetes longstanding currently uncontrolled. Medication adherence appears suboptimal. Control is suboptimal due to non-adherence, life stressors.  End of 6 month Liberate Study visit.  -Continued/ Restarted  GLP-1 Mounjaro  (tirzepatide )  2.5mg  once weekly -Extensively discussed pathophysiology of diabetes, recommended lifestyle interventions, dietary effects on blood sugar control.  -Counseled on s/sx of and management of hypoglycemia.  -Next A1c anticipated 2-3 months  ASCVD risk - primary prevention in patient with diabetes. Last LDL is 129 not at goal of <29 mg/dL. A high intensity statin indicated.  -Continued atorvastatin  40 mg.   Hypertension longstanding currently uncontrolled. Blood pressure goal of <130/80 mmHg. Medication adherence  reportedly good, but is not up to date on her fill history. Blood pressure control is suboptimal due to life stressors, diet non-adherence, possible medication non-adherence. -Continued amlodipine -olmesartan  10-20 mg.  Written patient instructions provided. Patient verbalized understanding of treatment plan.  Total time in face to face counseling 50 minutes.    Follow-up:  PCP clinic visit 02/26/24 Patient seen with Lawson Mao, PharmD Candidate - PY3 student and Belvie Macintosh, PharmD - PY4 Candidate.

## 2024-01-30 NOTE — Assessment & Plan Note (Signed)
 Diabetes longstanding currently uncontrolled. Medication adherence appears suboptimal. Control is suboptimal due to non-adherence, life stressors.  End of 6 month Liberate Study visit.  -Continued/ Restarted GLP-1 Mounjaro  (tirzepatide )  2.5mg  once weekly -Extensively discussed pathophysiology of diabetes, recommended lifestyle interventions, dietary effects on blood sugar control.  -Counseled on s/sx of and management of hypoglycemia.

## 2024-01-31 NOTE — Progress Notes (Signed)
 Reviewed and agree with Dr Rennis plan.

## 2024-02-06 NOTE — Progress Notes (Signed)
 [name]                                          MRN: [id number]   02/06/2024   The VBCI Quality Team Specialist reviewed this patient medical record for the purposes of chart review for care gap closure. The following were reviewed: chart review for care gap closure-colorectal cancer screening and glycemic status assessment.    VBCI Quality Team

## 2024-02-13 ENCOUNTER — Telehealth: Payer: Self-pay | Admitting: Pharmacist

## 2024-02-13 ENCOUNTER — Other Ambulatory Visit: Payer: Self-pay

## 2024-02-13 DIAGNOSIS — R052 Subacute cough: Secondary | ICD-10-CM

## 2024-02-13 DIAGNOSIS — E1169 Type 2 diabetes mellitus with other specified complication: Secondary | ICD-10-CM

## 2024-02-13 DIAGNOSIS — I10 Essential (primary) hypertension: Secondary | ICD-10-CM

## 2024-02-13 MED ORDER — MOUNJARO 5 MG/0.5ML ~~LOC~~ SOAJ: 5.0000 mg | SUBCUTANEOUS | 3 refills | Status: DC

## 2024-02-13 NOTE — Telephone Encounter (Signed)
 Reviewed and agree with Dr Rennis plan.

## 2024-02-13 NOTE — Telephone Encounter (Signed)
 Patient contacts office to request assistance medication access.  Requests new prescription   of Mounjaro  (tirzepatide ).  We agreed to increase dose to 5mg  weekly.  New prescription provided.   Total time with patient call and documentation of interaction: 9 minutes.

## 2024-02-14 ENCOUNTER — Telehealth: Payer: Self-pay | Admitting: Pharmacist

## 2024-02-14 MED ORDER — ALBUTEROL SULFATE HFA 108 (90 BASE) MCG/ACT IN AERS: 2.0000 | INHALATION_SPRAY | RESPIRATORY_TRACT | 1 refills | Status: AC | PRN

## 2024-02-14 MED ORDER — AMLODIPINE-OLMESARTAN 10-20 MG PO TABS: 1.0000 | ORAL_TABLET | Freq: Every day | ORAL | 2 refills | Status: AC

## 2024-02-14 MED ORDER — MOUNJARO 5 MG/0.5ML ~~LOC~~ SOAJ: 5.0000 mg | SUBCUTANEOUS | 1 refills | Status: DC

## 2024-02-14 MED ORDER — ATORVASTATIN CALCIUM 40 MG PO TABS: 40.0000 mg | ORAL_TABLET | Freq: Every day | ORAL | 3 refills | Status: DC

## 2024-02-14 NOTE — Telephone Encounter (Signed)
 Reviewed and agree with Dr Rennis plan.

## 2024-02-14 NOTE — Telephone Encounter (Signed)
 Patient contacts office to request 3 month supply prescription for Mounjaro  (tirzepatide ) which was sent as a 1 month supply recently.  She states a preference for this with her insurance.   Returned call to patient and discussed lack of flexibility to dose escalate if we send 3 month supply at this time.  Patient verbalized understanding of option and preferred to fill 3 month supply of 5mg  dose weekly.   I reinforced need to discuss dose escalation prior to refill in 3 months in order to optimize drug dosing/use.  Patient aware to this need.   New 3 month prescription - Mounjaro  (tirzepatide ) 5mg  weekly provided.   Total time with patient call and documentation of interaction: 9 minutes.

## 2024-02-26 ENCOUNTER — Ambulatory Visit: Admitting: Family Medicine

## 2024-02-27 ENCOUNTER — Encounter: Payer: Self-pay | Admitting: Family Medicine

## 2024-02-27 ENCOUNTER — Ambulatory Visit: Admitting: Family Medicine

## 2024-02-27 DIAGNOSIS — E119 Type 2 diabetes mellitus without complications: Secondary | ICD-10-CM

## 2024-02-27 MED ORDER — ACCU-CHEK AVIVA PLUS W/DEVICE KIT: 1.0000 | PACK | Freq: Three times a day (TID) | 1 refills | Status: AC | PRN

## 2024-02-27 MED ORDER — ACCU-CHEK AVIVA PLUS VI STRP: ORAL_STRIP | 12 refills | Status: DC

## 2024-02-27 MED ORDER — MOUNJARO 2.5 MG/0.5ML ~~LOC~~ SOAJ: 2.5000 mg | SUBCUTANEOUS | 3 refills | Status: DC

## 2024-02-27 NOTE — Patient Instructions (Addendum)
 1) I will decrease your dose of mounjaro  back to 2.5mg . Your symtpoms should improve as your body gets used to this dose. We can try to increase it again in the future.   2) I have also sent a prescription for your accuchek aviva and strips.   Follow-up in 3 months for your A1c. Let us  know if you feel ready to increase your mounjaro  before then.

## 2024-02-27 NOTE — Assessment & Plan Note (Signed)
 Unable to tolerate 5mg  mounjaro . - Decrease mounjaro  to 2.5mg  weekly - Sent accuchek aviva and strips - f/u in January for A1c. Advised to reach out sooner if she is ready to increase mounajaro

## 2024-02-27 NOTE — Progress Notes (Signed)
    SUBJECTIVE:   CHIEF COMPLAINT / HPI:   DM is a 58yo F that pf T2DM f/u. - reports that she is unable to tolerate her mounjaro  due to nausea, vomiting, and no appetite. - She was previously on 2.5mg  and was tolerating that well, so she thought she was ready for 5mg , but it is too high for her now.  - She also needs a new glucometer.  - Reports recently getting over a cold.   PERTINENT  PMH / PSH: lupus, T2DM, HTN  OBJECTIVE:   BP 136/78   Pulse 97   Ht 5' 2 (1.575 m)   Wt 196 lb 3.2 oz (89 kg)   SpO2 100%   BMI 35.89 kg/m   General: Alert, pleasant woman. NAD. HEENT: NCAT. MMM. CV: RRR, no murmurs. Cap refill <2. Resp: CTAB, no wheezing or crackles. Normal WOB on RA.   Ext: Moves all ext spontaneously Skin: Warm, well perfused   ASSESSMENT/PLAN:   Assessment & Plan Type 2 diabetes mellitus without complications, unspecified whether long term insulin  use (HCC) Unable to tolerate 5mg  mounjaro . - Decrease mounjaro  to 2.5mg  weekly - Sent accuchek aviva and strips - f/u in January for A1c. Advised to reach out sooner if she is ready to increase mounajaro      - Discussed health maintenance such as mammogram and colonoscopy. She declines these at this time. She understands the risks and benefits of these procedures and still would not like to pursue at this time. She wants to focus on her current health concerns including lupus.   Twyla Nearing, MD [facility]

## 2024-02-29 ENCOUNTER — Other Ambulatory Visit: Payer: Self-pay

## 2024-02-29 DIAGNOSIS — E1165 Type 2 diabetes mellitus with hyperglycemia: Secondary | ICD-10-CM

## 2024-03-01 MED ORDER — ONETOUCH DELICA PLUS LANCET33G MISC: 1.0000 | Freq: Three times a day (TID) | 3 refills | Status: AC | PRN

## 2024-03-07 ENCOUNTER — Telehealth: Payer: Self-pay | Admitting: Pharmacist

## 2024-03-07 NOTE — Telephone Encounter (Signed)
 Attempted to contact patient for follow-up of missed appointment. Wanted her to schedule appointment with me for DM follow-up and complete 2025 QI.   Left HIPAA compliant voice mail requesting call back to direct phone: [phone]  Total time with patient call and documentation of interaction: 3 minutes.

## 2024-03-18 NOTE — Progress Notes (Signed)
 IMAGINE NEST                                          MRN: [id number]   03/18/2024   The VBCI Quality Team Specialist reviewed this patient medical record for the purposes of chart review for care gap closure. The following were reviewed: chart review for care gap closure-kidney health evaluation for diabetes:eGFR  and uACR.    VBCI Quality Team

## 2024-03-18 NOTE — Progress Notes (Signed)
 [name]                                          MRN: [id number]   03/18/2024   The VBCI Quality Team Specialist reviewed this patient medical record for the purposes of chart review for care gap closure. The following were reviewed: chart review for care gap closure-glycemic status assessment.    VBCI Quality Team

## 2024-04-09 ENCOUNTER — Other Ambulatory Visit: Payer: Self-pay

## 2024-04-09 DIAGNOSIS — E119 Type 2 diabetes mellitus without complications: Secondary | ICD-10-CM

## 2024-04-09 NOTE — Telephone Encounter (Signed)
 Patient calls nurse line regarding blood glucose testing strips.   Patient reports that she has not received order from [facility] due to them not having correct prescription.   Last rx indicated to use as directed. Medicare is not able to bill without quantity per day. Patient reports that she tests up to 10 times per day. Advised that medicare will typically only cover up to 4 times per day. She states that she has always done this in the past.   Requested that I send message to Dr. Zheng for 10 strips per day.   Pended to encounter.   Chiquita JAYSON English, RN

## 2024-04-09 NOTE — Progress Notes (Signed)
 [name]                                          MRN: [id number]   04/09/2024   The VBCI Quality Team Specialist reviewed this patient medical record for the purposes of chart review for care gap closure. The following were reviewed: chart review for care gap closure-glycemic status assessment.    VBCI Quality Team

## 2024-04-09 NOTE — Progress Notes (Signed)
 ROSAMOND ANDRESS                                          MRN: [id number]   04/09/2024   The VBCI Quality Team Specialist reviewed this patient medical record for the purposes of chart review for care gap closure. The following were reviewed: chart review for care gap closure-kidney health evaluation for diabetes:eGFR  and uACR.    VBCI Quality Team

## 2024-04-10 MED ORDER — ACCU-CHEK AVIVA PLUS VI STRP: ORAL_STRIP | 3 refills | Status: AC

## 2024-05-14 ENCOUNTER — Telehealth (HOSPITAL_COMMUNITY): Payer: Self-pay

## 2024-05-14 ENCOUNTER — Other Ambulatory Visit (HOSPITAL_COMMUNITY): Payer: Self-pay

## 2024-05-14 ENCOUNTER — Encounter: Payer: Self-pay | Admitting: Family Medicine

## 2024-05-14 ENCOUNTER — Ambulatory Visit (INDEPENDENT_AMBULATORY_CARE_PROVIDER_SITE_OTHER): Payer: Self-pay | Admitting: Family Medicine

## 2024-05-14 VITALS — BP 120/76 | HR 91 | Ht 62.0 in | Wt 206.0 lb

## 2024-05-14 DIAGNOSIS — E785 Hyperlipidemia, unspecified: Secondary | ICD-10-CM | POA: Diagnosis not present

## 2024-05-14 DIAGNOSIS — E119 Type 2 diabetes mellitus without complications: Secondary | ICD-10-CM

## 2024-05-14 DIAGNOSIS — E1169 Type 2 diabetes mellitus with other specified complication: Secondary | ICD-10-CM | POA: Diagnosis not present

## 2024-05-14 DIAGNOSIS — I10 Essential (primary) hypertension: Secondary | ICD-10-CM

## 2024-05-14 LAB — POCT GLYCOSYLATED HEMOGLOBIN (HGB A1C): HbA1c, POC (controlled diabetic range): 13 % — AB (ref 0.0–7.0)

## 2024-05-14 MED ORDER — FREESTYLE LIBRE 3 PLUS SENSOR MISC
3 refills | Status: AC
  Filled 2024-05-14 – 2024-05-16 (×3): qty 6, 90d supply, fill #0

## 2024-05-14 MED ORDER — MOUNJARO 7.5 MG/0.5ML ~~LOC~~ SOAJ
7.5000 mg | SUBCUTANEOUS | 3 refills | Status: AC
  Filled 2024-05-14: qty 6, 84d supply, fill #0

## 2024-05-14 MED ORDER — MOUNJARO 7.5 MG/0.5ML ~~LOC~~ SOAJ
7.5000 mg | SUBCUTANEOUS | 3 refills | Status: DC
  Filled 2024-05-14: qty 2, 28d supply, fill #0

## 2024-05-14 NOTE — Progress Notes (Unsigned)
" ° ° °  SUBJECTIVE:   CHIEF COMPLAINT / HPI:   T2DM 5 mg mounjaro  weekly since thanksgiving  No belly pain, N/V/D  BG 150-165 recently  No dizziness lightheadedness or faint No increased urination/thirst in last week   A1c was 10.1 last week during home visit   PERTINENT  PMH / PSH: ***  OBJECTIVE:   BP 120/76   Pulse 91   Ht 5' 2 (1.575 m)   Wt 206 lb (93.4 kg)   SpO2 100%   BMI 37.68 kg/m   General: Well-appearing. Resting comfortably in room. CV: Normal S1/S2. No extra heart sounds. Warm and well-perfused. Pulm: Breathing comfortably on room air. CTAB. No increased WOB. Abd: Soft, non-tender, non-distended. Skin:  Warm, dry. Psych: Pleasant and appropriate.    ASSESSMENT/PLAN:   Assessment & Plan Type 2 diabetes mellitus without complications, unspecified whether long term insulin  use (HCC)      Kimberly Cassis, MD [facility]  "

## 2024-05-14 NOTE — Patient Instructions (Addendum)
 Thank you for visiting clinic today and allowing us  to participate in your care!  We increased your Mounjaro  to 7.5 mg weekly.  We are collecting labwork today and will keep you updated. Please consider using a continuous glucose monitor (Libre 3).   Please schedule an appointment with your PCP in 1 month for follow up.   Reach out any time with any questions or concerns you may have - we are here for you!  Damien Cassis, MD [facility] [phone]

## 2024-05-15 ENCOUNTER — Telehealth (HOSPITAL_COMMUNITY): Payer: Self-pay

## 2024-05-15 NOTE — Telephone Encounter (Signed)
 PA request has been Received. New Encounter has been or will be created for follow up. For additional info see Pharmacy Prior Auth telephone encounter from 05/15/24.

## 2024-05-15 NOTE — Assessment & Plan Note (Signed)
 Continue atorvastatin  40 daily, pending lipid panel today.

## 2024-05-15 NOTE — Telephone Encounter (Signed)
 In order to submit a Prior Authorization Request for FreeStyle Libre 3 Plus Sensor, the current Chart Note needs to be completed and signed. Submission will occur once this is completed.

## 2024-05-15 NOTE — Assessment & Plan Note (Signed)
 Well-controlled at present.  Continue Azor  10-20 daily.

## 2024-05-15 NOTE — Assessment & Plan Note (Signed)
 Reported recent A1c elevated to 10.1, will repeat today.  Additionally, collected BMP, lipid panel, UACR.  Given reported adherence to 5 mg Mounjaro  weekly over past couple of months, discussed increasing to 7.5 mg weekly dose.  Discussed the possibility of needing insulin  to control blood sugar, though patient defers at this time and would like to try monotherapy with increased Mounjaro  first.  She is understanding of potential for future insulin  need.  Given uncontrolled blood sugars and difficulty with POC glucose checks, discussed and ordered CGM.

## 2024-05-16 ENCOUNTER — Other Ambulatory Visit (HOSPITAL_COMMUNITY): Payer: Self-pay

## 2024-05-16 ENCOUNTER — Ambulatory Visit: Payer: Self-pay | Admitting: Family Medicine

## 2024-05-16 LAB — BASIC METABOLIC PANEL WITH GFR
BUN/Creatinine Ratio: 20 (ref 12–28)
BUN: 24 mg/dL (ref 8–27)
CO2: 21 mmol/L (ref 20–29)
Calcium: 10.1 mg/dL (ref 8.7–10.3)
Chloride: 100 mmol/L (ref 96–106)
Creatinine, Ser: 1.23 mg/dL — ABNORMAL HIGH (ref 0.57–1.00)
Glucose: 247 mg/dL — ABNORMAL HIGH (ref 70–99)
Potassium: 5.3 mmol/L — ABNORMAL HIGH (ref 3.5–5.2)
Sodium: 138 mmol/L (ref 134–144)
eGFR: 50 mL/min/1.73 — ABNORMAL LOW

## 2024-05-16 LAB — LIPID PANEL
Chol/HDL Ratio: 2.9 ratio (ref 0.0–4.4)
Cholesterol, Total: 184 mg/dL (ref 100–199)
HDL: 63 mg/dL
LDL Chol Calc (NIH): 102 mg/dL — ABNORMAL HIGH (ref 0–99)
Triglycerides: 104 mg/dL (ref 0–149)
VLDL Cholesterol Cal: 19 mg/dL (ref 5–40)

## 2024-05-16 LAB — MICROALBUMIN / CREATININE URINE RATIO
Creatinine, Urine: 195.5 mg/dL
Microalb/Creat Ratio: 41 mg/g{creat} — AB (ref 0–29)
Microalbumin, Urine: 80.4 ug/mL

## 2024-05-16 MED ORDER — ATORVASTATIN CALCIUM 80 MG PO TABS: 80.0000 mg | ORAL_TABLET | Freq: Every day | ORAL | 3 refills | Status: AC

## 2024-05-16 NOTE — Telephone Encounter (Signed)
 Pharmacy Patient Advocate Encounter   Received notification from Pt Calls Messages that prior authorization for FreeStyle Libre 3 Plus Sensor  is required/requested.   Insurance verification completed.   The patient is insured through [facility] MEDICAID.   Per test claim: PA required; PA submitted to above mentioned insurance via Latent Key/confirmation #/EOC [id number] Status is pending

## 2024-05-17 ENCOUNTER — Other Ambulatory Visit (HOSPITAL_COMMUNITY): Payer: Self-pay

## 2024-05-17 NOTE — Telephone Encounter (Signed)
 Pharmacy Patient Advocate Encounter  Received notification from [facility] MEDICAID that Prior Authorization for FreeStyle Libre 3 Plus Sensor  has been DENIED.  See denial reason below. No denial letter attached in CMM. Will attach denial letter to Media tab once received.   PA #/Case ID/Reference #: [id number]

## 2024-05-17 NOTE — Progress Notes (Signed)
 [name]                                          MRN: [id number]   05/17/2024   The VBCI Quality Team Specialist reviewed this patient medical record for the purposes of chart review for care gap closure. The following were reviewed: chart review for care gap closure-glycemic status assessment.    VBCI Quality Team

## 2024-09-19 ENCOUNTER — Encounter
